# Patient Record
Sex: Female | Born: 1995 | Race: Black or African American | Hispanic: No | Marital: Single | Smoking: Current some day smoker
Health system: Southern US, Community
[De-identification: ages and names within clinical notes are randomized; demographics above are authoritative.]

## PROBLEM LIST (undated history)

## (undated) ENCOUNTER — Inpatient Hospital Stay (HOSPITAL_COMMUNITY): Payer: Self-pay

## (undated) DIAGNOSIS — R109 Unspecified abdominal pain: Secondary | ICD-10-CM

## (undated) DIAGNOSIS — O26899 Other specified pregnancy related conditions, unspecified trimester: Secondary | ICD-10-CM

## (undated) DIAGNOSIS — B373 Candidiasis of vulva and vagina: Secondary | ICD-10-CM

## (undated) DIAGNOSIS — Z6791 Unspecified blood type, Rh negative: Secondary | ICD-10-CM

## (undated) DIAGNOSIS — J45909 Unspecified asthma, uncomplicated: Secondary | ICD-10-CM

## (undated) DIAGNOSIS — Z8619 Personal history of other infectious and parasitic diseases: Secondary | ICD-10-CM

## (undated) DIAGNOSIS — A4151 Sepsis due to Escherichia coli [E. coli]: Secondary | ICD-10-CM

## (undated) DIAGNOSIS — N390 Urinary tract infection, site not specified: Secondary | ICD-10-CM

## (undated) HISTORY — DX: Unspecified asthma, uncomplicated: J45.909

## (undated) HISTORY — PX: SKIN SURGERY: SHX2413

---
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
## 2016-10-12 NOTE — L&D Delivery Note (Signed)
Patient is 21 y.o. G1P1001 [redacted]w[redacted]d admitted for IOL for IUGR. S/p IOL with Pitocin. AROM at 0318.  Prenatal course also complicated by pyelonephritis, trichomonas, IUGR anemia.  Delivery Note At 6:32 AM a viable female was delivered via Vaginal, Spontaneous Delivery (Presentation: LOA).  APGAR: 7, 9; weight  pending.   Placenta status: Intact.  Cord: 3V with the following complications: None.  Cord pH: N/A  Anesthesia:  None Episiotomy: None Lacerations: Labial Suture Repair: vicryl Est. Blood Loss (mL): 300  Mom to postpartum.  Baby to Couplet care / Skin to Skin.  Caryl Ada, DO OB Fellow 07/06/2017, 7:31 AM

## 2016-11-12 DIAGNOSIS — Z8619 Personal history of other infectious and parasitic diseases: Secondary | ICD-10-CM

## 2016-11-12 HISTORY — DX: Personal history of other infectious and parasitic diseases: Z86.19

## 2016-11-26 ENCOUNTER — Encounter (HOSPITAL_COMMUNITY): Payer: Self-pay

## 2016-11-26 ENCOUNTER — Emergency Department (HOSPITAL_COMMUNITY)
Admission: EM | Admit: 2016-11-26 | Discharge: 2016-11-27 | Disposition: A | Discharge status: Home / Self Care | Payer: Medicaid Other | Attending: Emergency Medicine | Admitting: Emergency Medicine

## 2016-11-26 DIAGNOSIS — O009 Unspecified ectopic pregnancy without intrauterine pregnancy: Secondary | ICD-10-CM

## 2016-11-26 DIAGNOSIS — Z3A08 8 weeks gestation of pregnancy: Secondary | ICD-10-CM | POA: Diagnosis not present

## 2016-11-26 DIAGNOSIS — O4690 Antepartum hemorrhage, unspecified, unspecified trimester: Secondary | ICD-10-CM

## 2016-11-26 DIAGNOSIS — Z6791 Unspecified blood type, Rh negative: Secondary | ICD-10-CM

## 2016-11-26 DIAGNOSIS — O23591 Infection of other part of genital tract in pregnancy, first trimester: Secondary | ICD-10-CM | POA: Diagnosis not present

## 2016-11-26 DIAGNOSIS — N939 Abnormal uterine and vaginal bleeding, unspecified: Secondary | ICD-10-CM

## 2016-11-26 DIAGNOSIS — O200 Threatened abortion: Secondary | ICD-10-CM

## 2016-11-26 DIAGNOSIS — O98311 Other infections with a predominantly sexual mode of transmission complicating pregnancy, first trimester: Secondary | ICD-10-CM | POA: Diagnosis not present

## 2016-11-26 DIAGNOSIS — B9689 Other specified bacterial agents as the cause of diseases classified elsewhere: Secondary | ICD-10-CM

## 2016-11-26 DIAGNOSIS — A599 Trichomoniasis, unspecified: Secondary | ICD-10-CM

## 2016-11-26 DIAGNOSIS — O26899 Other specified pregnancy related conditions, unspecified trimester: Secondary | ICD-10-CM

## 2016-11-26 DIAGNOSIS — O360111 Maternal care for anti-D [Rh] antibodies, first trimester, fetus 1: Secondary | ICD-10-CM | POA: Insufficient documentation

## 2016-11-26 DIAGNOSIS — O2341 Unspecified infection of urinary tract in pregnancy, first trimester: Secondary | ICD-10-CM | POA: Insufficient documentation

## 2016-11-26 DIAGNOSIS — O209 Hemorrhage in early pregnancy, unspecified: Secondary | ICD-10-CM | POA: Diagnosis present

## 2016-11-26 DIAGNOSIS — N760 Acute vaginitis: Secondary | ICD-10-CM

## 2016-11-26 DIAGNOSIS — N390 Urinary tract infection, site not specified: Secondary | ICD-10-CM

## 2016-11-26 LAB — HCG, QUANTITATIVE, PREGNANCY: hCG, Beta Chain, Quant, S: 144598 m[IU]/mL — ABNORMAL HIGH (ref <5)

## 2016-11-26 LAB — URINALYSIS, ROUTINE W REFLEX MICROSCOPIC
Bilirubin Urine: NEGATIVE
Glucose, UA: NEGATIVE mg/dL
Hgb urine dipstick: NEGATIVE
KETONES UR: 20 mg/dL — AB
Nitrite: NEGATIVE
Protein, ur: 30 mg/dL — AB
Specific Gravity, Urine: 1.024 (ref 1.005–1.030)
pH: 5 (ref 5.0–8.0)

## 2016-11-26 LAB — PREGNANCY, URINE: Preg Test, Ur: POSITIVE — AB

## 2016-11-26 NOTE — ED Triage Notes (Addendum)
2 months pregnant and she has a little bleeding per family.  Started bleeding around 4 or 5 this afternoon.  I noticed one small blood clot.  Stomach was hurting, but I was not cramping.  Patient states that she has not seen an OBGYN

## 2016-11-26 NOTE — ED Notes (Signed)
Patient states that she is not bleeding at this time.

## 2016-11-27 ENCOUNTER — Emergency Department (HOSPITAL_COMMUNITY): Payer: Medicaid Other

## 2016-11-27 LAB — WET PREP, GENITAL
SPERM: NONE SEEN
YEAST WET PREP: NONE SEEN

## 2016-11-27 LAB — ABO/RH: ABO/RH(D): A NEG

## 2016-11-27 MED ORDER — NITROFURANTOIN MONOHYD MACRO 100 MG PO CAPS: 100.0000 mg | ORAL_CAPSULE | Freq: Two times a day (BID) | ORAL | 0 refills | Status: DC

## 2016-11-27 MED ORDER — RHO D IMMUNE GLOBULIN 1500 UNIT/2ML IJ SOSY
300.0000 ug | PREFILLED_SYRINGE | Freq: Once | INTRAMUSCULAR | Status: AC
  Administered 2016-11-27: 300 ug via INTRAMUSCULAR

## 2016-11-27 MED ORDER — METRONIDAZOLE 500 MG PO TABS: 500.0000 mg | ORAL_TABLET | Freq: Two times a day (BID) | ORAL | 0 refills | Status: DC

## 2016-11-27 NOTE — Discharge Instructions (Signed)
Take the antibiotics as prescribed. You can take acetaminophen 650 mg every 6 hrs for pain if needed. Keep your prenatal appointment on March 14. Remember your blood type is A negative.  Return to the ED if you get worsening pain, have bleeding like a regular period or if you pass large clots.

## 2016-11-27 NOTE — ED Provider Notes (Signed)
[facility]-EMERGENCY DEPT Provider Note   CSN: [id number] Arrival date & time: 11/26/16  1834     History   Chief Complaint Chief Complaint  Patient presents with  . Vaginal Bleeding    HPI April Young is a 21 y.o. female , G1P0 who is currently approximately [redacted] weeks pregnant with LMP 09/28/16 presenting with an approximate 2 hour episodes of low pelvic pain accompanied by passage of a small amount of bloody spotting and passage of a small blood clot this evening which has since resolved. She denies pain currently and the spotting has resolved.  She does endorse white thin vaginal discharge since the found out she was pregnant.  She also states she has struggled with nausea and vomiting daily associated with the pregnancy.  [facility] but will not formally have a full gyn exam to establish care until next month. She denies dysuria, hematuria, fever, back pain, diarrhea.  HPI  History reviewed. No pertinent past medical history.  There are no active problems to display for this patient.   Past Surgical History:  Procedure Laterality Date  . SKIN SURGERY     abcess    OB History    Gravida Para Term Preterm AB Living   1 0 0 0 0 0   SAB TAB Ectopic Multiple Live Births   0               Home Medications    Prior to Admission medications   Medication Sig Start Date End Date Taking? Authorizing Provider  ibuprofen (ADVIL,MOTRIN) 400 MG tablet Take 1 tablet (400 mg total) by mouth every 6 (six) hours as needed for mild pain or cramping. 02/06/15   Marcellina Millin, MD    Family History No family history on file.  Social History Social History  Substance Use Topics  . Smoking status: Never Smoker  . Smokeless tobacco: Never Used  . Alcohol use No     Allergies   Penicillins   Review of Systems Review of Systems  Constitutional: Negative for fever.  HENT: Negative for congestion and sore throat.   Eyes:  Negative.   Respiratory: Negative for chest tightness and shortness of breath.   Cardiovascular: Negative for chest pain.  Gastrointestinal: Positive for abdominal pain, nausea and vomiting.  Genitourinary: Positive for vaginal bleeding and vaginal discharge. Negative for dysuria and hematuria.  Musculoskeletal: Negative for arthralgias, joint swelling and neck pain.  Skin: Negative.  Negative for rash and wound.  Neurological: Negative for dizziness, weakness, light-headedness, numbness and headaches.  Psychiatric/Behavioral: Negative.      Physical Exam Updated Vital Signs BP 120/57 (BP Location: Right Arm)   Pulse 85   Temp 98.7 F (37.1 C)   Resp 18   Ht 5\' 8"  (1.727 m)   Wt 47.2 kg   LMP 09/28/2016 (Exact Date)   SpO2 100%   BMI 15.81 kg/m   Physical Exam  Constitutional: She appears well-developed and well-nourished.  HENT:  Head: Normocephalic and atraumatic.  Eyes: Conjunctivae are normal.  Neck: Normal range of motion.  Cardiovascular: Normal rate, regular rhythm, normal heart sounds and intact distal pulses.   Pulmonary/Chest: Effort normal and breath sounds normal. She has no wheezes.  Abdominal: Soft. Bowel sounds are normal. She exhibits no distension and no mass. There is no tenderness. There is no guarding.  Genitourinary: Uterus normal. Cervix exhibits discharge. Cervix exhibits no motion tenderness. Right adnexum displays tenderness. Right adnexum displays  no mass and no fullness. Left adnexum displays tenderness. Left adnexum displays no mass and no fullness. No erythema or bleeding in the vagina. Vaginal discharge found.  Musculoskeletal: Normal range of motion.  Neurological: She is alert.  Skin: Skin is warm and dry.  Psychiatric: She has a normal mood and affect.  Nursing note and vitals reviewed.    ED Treatments / Results  Labs (all labs ordered are listed, but only abnormal results are displayed) Results for orders placed or performed during the  hospital encounter of 11/26/16  Wet prep, genital  Result Value Ref Range   Yeast Wet Prep HPF POC NONE SEEN NONE SEEN   Trich, Wet Prep PRESENT (A) NONE SEEN   Clue Cells Wet Prep HPF POC PRESENT (A) NONE SEEN   WBC, Wet Prep HPF POC FEW (A) NONE SEEN   Sperm NONE SEEN   hCG, quantitative, pregnancy  Result Value Ref Range   hCG, Beta Chain, Quant, S 144,598 (H) <5 mIU/mL  Pregnancy, urine  Result Value Ref Range   Preg Test, Ur POSITIVE (A) NEGATIVE  Urinalysis, Routine w reflex microscopic  Result Value Ref Range   Color, Urine YELLOW YELLOW   APPearance HAZY (A) CLEAR   Specific Gravity, Urine 1.024 1.005 - 1.030   pH 5.0 5.0 - 8.0   Glucose, UA NEGATIVE NEGATIVE mg/dL   Hgb urine dipstick NEGATIVE NEGATIVE   Bilirubin Urine NEGATIVE NEGATIVE   Ketones, ur 20 (A) NEGATIVE mg/dL   Protein, ur 30 (A) NEGATIVE mg/dL   Nitrite NEGATIVE NEGATIVE   Leukocytes, UA LARGE (A) NEGATIVE   RBC / HPF 6-30 0 - 5 RBC/hpf   WBC, UA TOO NUMEROUS TO COUNT 0 - 5 WBC/hpf   Bacteria, UA MANY (A) NONE SEEN   Mucous PRESENT    Budding Yeast PRESENT    Hyaline Casts, UA PRESENT   ABO/Rh  Result Value Ref Range   ABO/RH(D) A NEG    No results found.   EKG  EKG Interpretation None       Radiology No results found.  Procedures Procedures (including critical care time)  Medications Ordered in ED Medications - No data to display   Initial Impression / Assessment and Plan / ED Course  I have reviewed the triage vital signs and the nursing notes.  Pertinent labs & imaging results that were available during my care of the patient were reviewed by me and considered in my medical decision making (see chart for details).     Labs reviewed, urine cx sent.  Pt with trichomonas and bv, ua not normal with leukocytes and bacteria but no dysuria or sx suggesting uti.  Culture ordered.  Pt is rh negative, rhogam IM ordered.  Pending US to rule out ectopic.    Discussed with Dr Lynelle DoctorKnapp who  will follow and dispo patient.  Final Clinical Impressions(s) / ED Diagnoses   Final diagnoses:  Vaginal bleeding in pregnancy  Trichomonas infection  Bacterial vaginosis    New Prescriptions New Prescriptions   No medications on file     Burgess AmorJulie Briell Paulette, PA-C 11/27/16 0140    Burgess AmorJulie Asheley Hellberg, PA-C 11/27/16 [phone]    Devoria AlbeIva Knapp, MD 11/27/16 [phone]

## 2016-11-28 LAB — RH IG WORKUP (INCLUDES ABO/RH)
ABO/RH(D): A NEG
Antibody Screen: NEGATIVE
Gestational Age(Wks): 8
UNIT DIVISION: 0

## 2016-11-28 LAB — RPR: RPR: NONREACTIVE

## 2016-11-28 LAB — HIV ANTIBODY (ROUTINE TESTING W REFLEX): HIV Screen 4th Generation wRfx: NONREACTIVE

## 2016-11-30 LAB — URINE CULTURE
Culture: >=100000 — AB
Special Requests: NORMAL

## 2016-11-30 LAB — GC/CHLAMYDIA PROBE AMP (~~LOC~~) NOT AT ARMC
CHLAMYDIA, DNA PROBE: NEGATIVE
NEISSERIA GONORRHEA: NEGATIVE

## 2016-12-01 ENCOUNTER — Telehealth: Payer: Self-pay | Admitting: Emergency Medicine

## 2016-12-01 NOTE — Progress Notes (Signed)
ED Antimicrobial Stewardship Positive Culture Follow Up   April Young is an 21 y.o. female [facility] with a chief complaint of  Chief Complaint  Patient presents with  . Vaginal Bleeding    Recent Results (from the past 720 hour(s))  Urine culture     Status: Abnormal   Collection Time: 11/26/16  8:11 PM  Result Value Ref Range Status   Specimen Description URINE, CLEAN CATCH  Final   Special Requests Normal  Final   Culture (A)  Final    >=100,000 COLONIES/mL ESCHERICHIA COLI >=100,000 COLONIES/mL STAPHYLOCOCCUS SPECIES (COAGULASE NEGATIVE)    Report Status 11/30/2016 FINAL  Final   Organism ID, Bacteria ESCHERICHIA COLI (A)  Final   Organism ID, Bacteria STAPHYLOCOCCUS SPECIES (COAGULASE NEGATIVE) (A)  Final      Susceptibility   Escherichia coli - MIC*    AMPICILLIN <=2 SENSITIVE Sensitive     CEFAZOLIN <=4 SENSITIVE Sensitive     CEFTRIAXONE <=1 SENSITIVE Sensitive     CIPROFLOXACIN <=0.25 SENSITIVE Sensitive     GENTAMICIN <=1 SENSITIVE Sensitive     IMIPENEM <=0.25 SENSITIVE Sensitive     NITROFURANTOIN <=16 SENSITIVE Sensitive     TRIMETH/SULFA <=20 SENSITIVE Sensitive     AMPICILLIN/SULBACTAM <=2 SENSITIVE Sensitive     PIP/TAZO <=4 SENSITIVE Sensitive     Extended ESBL NEGATIVE Sensitive     * >=100,000 COLONIES/mL ESCHERICHIA COLI   Staphylococcus species (coagulase negative) - MIC*    CIPROFLOXACIN <=0.5 SENSITIVE Sensitive     GENTAMICIN <=0.5 SENSITIVE Sensitive     NITROFURANTOIN <=16 SENSITIVE Sensitive     OXACILLIN SENSITIVE Sensitive     TETRACYCLINE <=1 SENSITIVE Sensitive     VANCOMYCIN <=0.5 SENSITIVE Sensitive     TRIMETH/SULFA <=10 SENSITIVE Sensitive     CLINDAMYCIN <=0.25 SENSITIVE Sensitive     RIFAMPIN <=0.5 SENSITIVE Sensitive     Inducible Clindamycin NEGATIVE Sensitive     * >=100,000 COLONIES/mL STAPHYLOCOCCUS SPECIES (COAGULASE NEGATIVE)  Wet prep, genital     Status: Abnormal   Collection Time:  11/26/16 11:56 PM  Result Value Ref Range Status   Yeast Wet Prep HPF POC NONE SEEN NONE SEEN Final   Trich, Wet Prep PRESENT (A) NONE SEEN Final   Clue Cells Wet Prep HPF POC PRESENT (A) NONE SEEN Final   WBC, Wet Prep HPF POC FEW (A) NONE SEEN Final   Sperm NONE SEEN  Final    20 YOF noted to be [redacted] weeks pregnant who presented on 2/15 with vaginal bleeding. Work-up revealed UTI, BV, and trichomonas. The patient was discharged on Flagyl and Macrobid for 5 days.   Flagyl is noted to be a pregnancy class B medication however it is noted to have some risk for cleft palate when given in the first trimester of pregnancy. This was discussed with the PA-C who does not feel strongly enough to adjust the medication regimen at this time. Will continue the Flagyl as prescribed.  Also in pregnant patients - a 7-10 day antibiotic duration is recommended for UTIs - would recommend extending the course of Macrobid to ensure treated completely.  New antibiotic prescription: Continue Macrobid 100 mg bid for an additional 5 days to make a total LOT of 10 days (will need new script sent for 10 more pills). Continue Flagyl as prescribed.   ED Provider: Langston MaskerKaren Sofia, PA-C  [name], April Ensz Ann 12/01/2016, 9:03 AM Infectious Diseases Pharmacist Phone# [phone]

## 2016-12-01 NOTE — Telephone Encounter (Signed)
Post ED Visit - Positive Culture Follow-up: Successful Patient Follow-Up  Culture assessed and recommendations reviewed by: []  Enzo BiNathan Batchelder, Pharm.D. []  Celedonio MiyamotoJeremy Frens, [address] BoulevardPharm.D., BCPS []  Garvin FilaMike Maccia, Pharm.D. [x]  Georgina PillionElizabeth Martin, Pharm.D., BCPS []  ThunderboltMinh Pham, VermontPharm.D., BCPS, AAHIVP []  Estella HuskMichelle Turner, Pharm.D., BCPS, AAHIVP []  Tennis Mustassie Stewart, Pharm.D. []  Sherle Poeob Vincent, [address]Pharm[address]D.  Positive urine culture  []  Patient discharged without antimicrobial prescription and treatment is now indicated []  Organism is resistant to prescribed ED discharge antimicrobial []  Patient with positive blood cultures  Changes discussed with ED provider: Lawyer PA New antibiotic prescription add additional 5 days of Macrobid 100mg  po bid x 5 more days [facility]  Contacted patient, 12/01/16 1551   Berle MullMiller, Jaid Quirion 12/01/2016, 3:50 PM

## 2017-01-01 ENCOUNTER — Other Ambulatory Visit: Payer: Self-pay | Admitting: Obstetrics and Gynecology

## 2017-01-01 DIAGNOSIS — O3680X0 Pregnancy with inconclusive fetal viability, not applicable or unspecified: Secondary | ICD-10-CM

## 2017-01-04 ENCOUNTER — Ambulatory Visit (INDEPENDENT_AMBULATORY_CARE_PROVIDER_SITE_OTHER): Payer: Medicaid Other

## 2017-01-04 DIAGNOSIS — O3680X0 Pregnancy with inconclusive fetal viability, not applicable or unspecified: Secondary | ICD-10-CM | POA: Diagnosis not present

## 2017-01-04 NOTE — Progress Notes (Addendum)
US 14 wks,fhr 137 bpm,normal ov's bilat,NB present,post pl gr 0,efw 102 g,EDD 07/05/2017

## 2017-01-10 ENCOUNTER — Inpatient Hospital Stay (HOSPITAL_COMMUNITY)
Admission: AD | Admit: 2017-01-10 | Discharge: 2017-01-10 | Disposition: A | Discharge status: Home / Self Care | Payer: Medicaid Other | Source: Ambulatory Visit | Attending: Obstetrics and Gynecology | Admitting: Obstetrics and Gynecology

## 2017-01-10 ENCOUNTER — Encounter (HOSPITAL_COMMUNITY): Payer: Self-pay

## 2017-01-10 DIAGNOSIS — O98312 Other infections with a predominantly sexual mode of transmission complicating pregnancy, second trimester: Secondary | ICD-10-CM | POA: Insufficient documentation

## 2017-01-10 DIAGNOSIS — O26892 Other specified pregnancy related conditions, second trimester: Secondary | ICD-10-CM | POA: Diagnosis not present

## 2017-01-10 DIAGNOSIS — Z880 Allergy status to penicillin: Secondary | ICD-10-CM | POA: Diagnosis not present

## 2017-01-10 DIAGNOSIS — A5901 Trichomonal vulvovaginitis: Secondary | ICD-10-CM | POA: Diagnosis not present

## 2017-01-10 DIAGNOSIS — Z79899 Other long term (current) drug therapy: Secondary | ICD-10-CM | POA: Insufficient documentation

## 2017-01-10 DIAGNOSIS — O209 Hemorrhage in early pregnancy, unspecified: Secondary | ICD-10-CM | POA: Diagnosis not present

## 2017-01-10 DIAGNOSIS — N939 Abnormal uterine and vaginal bleeding, unspecified: Secondary | ICD-10-CM | POA: Diagnosis present

## 2017-01-10 DIAGNOSIS — R109 Unspecified abdominal pain: Secondary | ICD-10-CM | POA: Diagnosis not present

## 2017-01-10 DIAGNOSIS — Z3A14 14 weeks gestation of pregnancy: Secondary | ICD-10-CM | POA: Diagnosis not present

## 2017-01-10 DIAGNOSIS — O23592 Infection of other part of genital tract in pregnancy, second trimester: Secondary | ICD-10-CM | POA: Diagnosis not present

## 2017-01-10 HISTORY — DX: Other specified pregnancy related conditions, unspecified trimester: O26.899

## 2017-01-10 HISTORY — DX: Unspecified blood type, rh negative: Z67.91

## 2017-01-10 HISTORY — DX: Personal history of other infectious and parasitic diseases: Z86.19

## 2017-01-10 LAB — URINALYSIS, ROUTINE W REFLEX MICROSCOPIC
BILIRUBIN URINE: NEGATIVE
Glucose, UA: NEGATIVE mg/dL
Ketones, ur: NEGATIVE mg/dL
NITRITE: POSITIVE — AB
PH: 7 (ref 5.0–8.0)
Protein, ur: NEGATIVE mg/dL
Specific Gravity, Urine: 1.021 (ref 1.005–1.030)

## 2017-01-10 LAB — WET PREP, GENITAL
CLUE CELLS WET PREP: NONE SEEN
Sperm: NONE SEEN
Yeast Wet Prep HPF POC: NONE SEEN

## 2017-01-10 MED ORDER — METRONIDAZOLE 500 MG PO TABS
2000.0000 mg | ORAL_TABLET | Freq: Once | ORAL | Status: AC
  Administered 2017-01-10: 2000 mg via ORAL
  Filled 2017-01-10: qty 4

## 2017-01-10 NOTE — MAU Provider Note (Signed)
History     CSN: [id number]  Arrival date and time: 01/10/17 1403    First Provider Initiated Contact with Patient 01/10/17 1456      Chief Complaint  Patient presents with  . Vaginal Bleeding  . Abdominal Pain   HPI April Young is a 21 y.o. G1P0000 at [redacted]w[redacted]d who presents via EMS with vaginal bleeding & abdominal cramping. Symptoms began 10 am this morning. Reports bright red blood on pad today. Not passing clots. Had abdominal cramping yesterday. Currently no pain. Pt is Rh negative -- received rhogham in ED 2/16 d/t vaginal bleeding. During that ED visit she was diagnosed with trichomonas -- states she was discharge home with abx (flagyl 500 mg BID x 7 days) -- states she didn't finish antibiotics b/c they made her vomit. Has not had intercourse since that visit. Denies n/v/d, constipation, vaginal discharge, LOF.   OB History    Gravida Para Term Preterm AB Living   1 0 0 0 0 0   SAB TAB Ectopic Multiple Live Births   0              Past Medical History:  Diagnosis Date  . Hx of trichomoniasis 11/2016  . Rh negative status during pregnancy     Past Surgical History:  Procedure Laterality Date  . SKIN SURGERY     abcess    History reviewed. No pertinent family history.  Social History  Substance Use Topics  . Smoking status: Never Smoker  . Smokeless tobacco: Never Used  . Alcohol use No    Allergies:  Allergies  Allergen Reactions  . Penicillins     Has patient had a PCN reaction causing immediate rash, facial/tongue/throat swelling, SOB or lightheadedness with hypotension: No Has patient had a PCN reaction causing severe rash involving mucus membranes or skin necrosis: No Has patient had a PCN reaction that required hospitalization No Has patient had a PCN reaction occurring within the last 10 years: No If all of the above answers are "NO", then may proceed with Cephalosporin use.     Prescriptions Prior to Admission  Medication Sig Dispense Refill  Last Dose  . ibuprofen (ADVIL,MOTRIN) 400 MG tablet Take 1 tablet (400 mg total) by mouth every 6 (six) hours as needed for mild pain or cramping. 20 tablet 0   . metroNIDAZOLE (FLAGYL) 500 MG tablet Take 1 tablet (500 mg total) by mouth 2 (two) times daily. 14 tablet 0   . nitrofurantoin, macrocrystal-monohydrate, (MACROBID) 100 MG capsule Take 1 capsule (100 mg total) by mouth 2 (two) times daily. 10 capsule 0     Review of Systems  Constitutional: Negative.   Gastrointestinal: Positive for abdominal pain. Negative for constipation, diarrhea, nausea and vomiting.  Genitourinary: Positive for vaginal bleeding. Negative for dysuria, flank pain and vaginal discharge.   Physical Exam   Blood pressure 111/69, pulse 89, temperature 97.7 F (36.5 C), temperature source Oral, resp. rate 16, last menstrual period 09/28/2016.  Physical Exam  Nursing note and vitals reviewed. Constitutional: She is oriented to person, place, and time. She appears well-developed and well-nourished. No distress.  HENT:  Head: Normocephalic and atraumatic.  Eyes: Conjunctivae are normal. Right eye exhibits no discharge. Left eye exhibits no discharge. No scleral icterus.  Neck: Normal range of motion.  Cardiovascular: Normal rate, regular rhythm and normal heart sounds.   No murmur heard. Respiratory: Effort normal and breath sounds normal. No respiratory distress. She has no wheezes.  GI: Soft. Bowel sounds  are normal. There is no tenderness.  Genitourinary: Cervix exhibits friability. Cervix exhibits no motion tenderness. No bleeding in the vagina. Vaginal discharge found.  Genitourinary Comments: Cervix closed  Neurological: She is alert and oriented to person, place, and time.  Skin: Skin is warm and dry. She is not diaphoretic.  Psychiatric: She has a normal mood and affect. Her behavior is normal. Judgment and thought content normal.    MAU Course  Procedures Results for orders placed or performed  during the hospital encounter of 01/10/17 (from the past 24 hour(s))  Urinalysis, Routine w reflex microscopic     Status: Abnormal   Collection Time: 01/10/17  2:25 PM  Result Value Ref Range   Color, Urine YELLOW YELLOW   APPearance HAZY (A) CLEAR   Specific Gravity, Urine 1.021 1.005 - 1.030   pH 7.0 5.0 - 8.0   Glucose, UA NEGATIVE NEGATIVE mg/dL   Hgb urine dipstick SMALL (A) NEGATIVE   Bilirubin Urine NEGATIVE NEGATIVE   Ketones, ur NEGATIVE NEGATIVE mg/dL   Protein, ur NEGATIVE NEGATIVE mg/dL   Nitrite POSITIVE (A) NEGATIVE   Leukocytes, UA MODERATE (A) NEGATIVE   RBC / HPF 0-5 0 - 5 RBC/hpf   WBC, UA TOO NUMEROUS TO COUNT 0 - 5 WBC/hpf   Bacteria, UA MANY (A) NONE SEEN   Squamous Epithelial / LPF 0-5 (A) NONE SEEN   Mucous PRESENT   Wet prep, genital     Status: Abnormal   Collection Time: 01/10/17  3:11 PM  Result Value Ref Range   Yeast Wet Prep HPF POC NONE SEEN NONE SEEN   Trich, Wet Prep PRESENT (A) NONE SEEN   Clue Cells Wet Prep HPF POC NONE SEEN NONE SEEN   WBC, Wet Prep HPF POC FEW (A) NONE SEEN   Sperm NONE SEEN     MDM FHT 140 by doppler GC/CT & wet prep Rhogham not indicated today -- VB is d/t cervical friability & pt received rhogham in February Wet prep + trich Flagyl 2 gm PO U/a + nitrites  --- will send for urine culture -- pt denies urinary complaints -- nitrites likely d/t trich infection  Assessment and Plan  A; 1. Trichomonal vaginitis during pregnancy in second trimester    P: Discharge home Pelvic rest Partner needs tx GC/CT & urine culture pending Discussed reasons to return to MAU  Judeth Horn 01/10/2017, 2:43 PM

## 2017-01-10 NOTE — Discharge Instructions (Signed)

## 2017-01-10 NOTE — MAU Note (Addendum)
Patient arrived via EMS with c/o onset of vaginal bleeding and cramping which started around 10:00 am, still wearing pad from onset of bleeding. States had spotting yesterday, no recent intercourse, denies pain at this time.

## 2017-01-11 LAB — GC/CHLAMYDIA PROBE AMP (~~LOC~~) NOT AT ARMC
CHLAMYDIA, DNA PROBE: NEGATIVE
Neisseria Gonorrhea: NEGATIVE

## 2017-01-12 LAB — CULTURE, OB URINE: Culture: >=100000 — AB

## 2017-01-13 ENCOUNTER — Other Ambulatory Visit (HOSPITAL_COMMUNITY): Payer: Self-pay | Admitting: Advanced Practice Midwife

## 2017-01-13 MED ORDER — SULFAMETHOXAZOLE-TRIMETHOPRIM 800-160 MG PO TABS: 1.0000 | ORAL_TABLET | Freq: Two times a day (BID) | ORAL | 0 refills | Status: DC

## 2017-01-13 NOTE — Progress Notes (Signed)
UTI, E Coli  Allergic to PCN  Rx Bactrim DS since she is out of first trimester  Patient notified  Aviva Signs, CNM

## 2017-01-19 ENCOUNTER — Ambulatory Visit: Payer: Medicaid Other | Admitting: *Deleted

## 2017-01-19 ENCOUNTER — Ambulatory Visit (INDEPENDENT_AMBULATORY_CARE_PROVIDER_SITE_OTHER): Payer: Medicaid Other | Admitting: Advanced Practice Midwife

## 2017-01-19 ENCOUNTER — Encounter: Payer: Self-pay | Admitting: Advanced Practice Midwife

## 2017-01-19 VITALS — BP 102/52 | HR 93 | Wt 112.0 lb

## 2017-01-19 DIAGNOSIS — O0990 Supervision of high risk pregnancy, unspecified, unspecified trimester: Secondary | ICD-10-CM | POA: Insufficient documentation

## 2017-01-19 DIAGNOSIS — Z331 Pregnant state, incidental: Secondary | ICD-10-CM | POA: Diagnosis not present

## 2017-01-19 DIAGNOSIS — Z3A16 16 weeks gestation of pregnancy: Secondary | ICD-10-CM

## 2017-01-19 DIAGNOSIS — Z1389 Encounter for screening for other disorder: Secondary | ICD-10-CM | POA: Diagnosis not present

## 2017-01-19 DIAGNOSIS — Z3402 Encounter for supervision of normal first pregnancy, second trimester: Secondary | ICD-10-CM | POA: Diagnosis not present

## 2017-01-19 DIAGNOSIS — Z363 Encounter for antenatal screening for malformations: Secondary | ICD-10-CM

## 2017-01-19 DIAGNOSIS — O0930 Supervision of pregnancy with insufficient antenatal care, unspecified trimester: Secondary | ICD-10-CM

## 2017-01-19 LAB — POCT URINALYSIS DIPSTICK
Blood, UA: NEGATIVE
GLUCOSE UA: NEGATIVE
KETONES UA: NEGATIVE
Leukocytes, UA: NEGATIVE
Nitrite, UA: POSITIVE
Protein, UA: NEGATIVE

## 2017-01-19 NOTE — Patient Instructions (Signed)
 First Trimester of Pregnancy The first trimester of pregnancy is from week 1 until the end of week 12 (months 1 through 3). A week after a sperm fertilizes an egg, the egg will implant on the wall of the uterus. This embryo will begin to develop into a baby. Genes from you and your partner are forming the baby. The female genes determine whether the baby is a boy or a girl. At 6-8 weeks, the eyes and face are formed, and the heartbeat can be seen on ultrasound. At the end of 12 weeks, all the baby's organs are formed.  Now that you are pregnant, you will want to do everything you can to have a healthy baby. Two of the most important things are to get good prenatal care and to follow your health care provider's instructions. Prenatal care is all the medical care you receive before the baby's birth. This care will help prevent, find, and treat any problems during the pregnancy and childbirth. BODY CHANGES Your body goes through many changes during pregnancy. The changes vary from woman to woman.   You may gain or lose a couple of pounds at first.  You may feel sick to your stomach (nauseous) and throw up (vomit). If the vomiting is uncontrollable, call your health care provider.  You may tire easily.  You may develop headaches that can be relieved by medicines approved by your health care provider.  You may urinate more often. Painful urination may mean you have a bladder infection.  You may develop heartburn as a result of your pregnancy.  You may develop constipation because certain hormones are causing the muscles that push waste through your intestines to slow down.  You may develop hemorrhoids or swollen, bulging veins (varicose veins).  Your breasts may begin to grow larger and become tender. Your nipples may stick out more, and the tissue that surrounds them (areola) may become darker.  Your gums may bleed and may be sensitive to brushing and flossing.  Dark spots or blotches  (chloasma, mask of pregnancy) may develop on your face. This will likely fade after the baby is born.  Your menstrual periods will stop.  You may have a loss of appetite.  You may develop cravings for certain kinds of food.  You may have changes in your emotions from day to day, such as being excited to be pregnant or being concerned that something may go wrong with the pregnancy and baby.  You may have more vivid and strange dreams.  You may have changes in your hair. These can include thickening of your hair, rapid growth, and changes in texture. Some women also have hair loss during or after pregnancy, or hair that feels dry or thin. Your hair will most likely return to normal after your baby is born. WHAT TO EXPECT AT YOUR PRENATAL VISITS During a routine prenatal visit:  You will be weighed to make sure you and the baby are growing normally.  Your blood pressure will be taken.  Your abdomen will be measured to track your baby's growth.  The fetal heartbeat will be listened to starting around week 10 or 12 of your pregnancy.  Test results from any previous visits will be discussed. Your health care provider may ask you:  How you are feeling.  If you are feeling the baby move.  If you have had any abnormal symptoms, such as leaking fluid, bleeding, severe headaches, or abdominal cramping.  If you have any questions. Other   tests that may be performed during your first trimester include:  Blood tests to find your blood type and to check for the presence of any previous infections. They will also be used to check for low iron levels (anemia) and Rh antibodies. Later in the pregnancy, blood tests for diabetes will be done along with other tests if problems develop.  Urine tests to check for infections, diabetes, or protein in the urine.  An ultrasound to confirm the proper growth and development of the baby.  An amniocentesis to check for possible genetic problems.  Fetal  screens for spina bifida and Down syndrome.  You may need other tests to make sure you and the baby are doing well. HOME CARE INSTRUCTIONS  Medicines  Follow your health care provider's instructions regarding medicine use. Specific medicines may be either safe or unsafe to take during pregnancy.  Take your prenatal vitamins as directed.  If you develop constipation, try taking a stool softener if your health care provider approves. Diet  Eat regular, well-balanced meals. Choose a variety of foods, such as meat or vegetable-based protein, fish, milk and low-fat dairy products, vegetables, fruits, and whole grain breads and cereals. Your health care provider will help you determine the amount of weight gain that is right for you.  Avoid raw meat and uncooked cheese. These carry germs that can cause birth defects in the baby.  Eating four or five small meals rather than three large meals a day may help relieve nausea and vomiting. If you start to feel nauseous, eating a few soda crackers can be helpful. Drinking liquids between meals instead of during meals also seems to help nausea and vomiting.  If you develop constipation, eat more high-fiber foods, such as fresh vegetables or fruit and whole grains. Drink enough fluids to keep your urine clear or pale yellow. Activity and Exercise  Exercise only as directed by your health care provider. Exercising will help you:  Control your weight.  Stay in shape.  Be prepared for labor and delivery.  Experiencing pain or cramping in the lower abdomen or low back is a good sign that you should stop exercising. Check with your health care provider before continuing normal exercises.  Try to avoid standing for long periods of time. Move your legs often if you must stand in one place for a long time.  Avoid heavy lifting.  Wear low-heeled shoes, and practice good posture.  You may continue to have sex unless your health care provider directs you  otherwise. Relief of Pain or Discomfort  Wear a good support bra for breast tenderness.   Take warm sitz baths to soothe any pain or discomfort caused by hemorrhoids. Use hemorrhoid cream if your health care provider approves.   Rest with your legs elevated if you have leg cramps or low back pain.  If you develop varicose veins in your legs, wear support hose. Elevate your feet for 15 minutes, 3-4 times a day. Limit salt in your diet. Prenatal Care  Schedule your prenatal visits by the twelfth week of pregnancy. They are usually scheduled monthly at first, then more often in the last 2 months before delivery.  Write down your questions. Take them to your prenatal visits.  Keep all your prenatal visits as directed by your health care provider. Safety  Wear your seat belt at all times when driving.  Make a list of emergency phone numbers, including numbers for family, friends, the hospital, and police and fire departments. General   Tips  Ask your health care provider for a referral to a local prenatal education class. Begin classes no later than at the beginning of month 6 of your pregnancy.  Ask for help if you have counseling or nutritional needs during pregnancy. Your health care provider can offer advice or refer you to specialists for help with various needs.  Do not use hot tubs, steam rooms, or saunas.  Do not douche or use tampons or scented sanitary pads.  Do not cross your legs for long periods of time.  Avoid cat litter boxes and soil used by cats. These carry germs that can cause birth defects in the baby and possibly loss of the fetus by miscarriage or stillbirth.  Avoid all smoking, herbs, alcohol, and medicines not prescribed by your health care provider. Chemicals in these affect the formation and growth of the baby.  Schedule a dentist appointment. At home, brush your teeth with a soft toothbrush and be gentle when you floss. SEEK MEDICAL CARE IF:   You have  dizziness.  You have mild pelvic cramps, pelvic pressure, or nagging pain in the abdominal area.  You have persistent nausea, vomiting, or diarrhea.  You have a bad smelling vaginal discharge.  You have pain with urination.  You notice increased swelling in your face, hands, legs, or ankles. SEEK IMMEDIATE MEDICAL CARE IF:   You have a fever.  You are leaking fluid from your vagina.  You have spotting or bleeding from your vagina.  You have severe abdominal cramping or pain.  You have rapid weight gain or loss.  You vomit blood or material that looks like coffee grounds.  You are exposed to German measles and have never had them.  You are exposed to fifth disease or chickenpox.  You develop a severe headache.  You have shortness of breath.  You have any kind of trauma, such as from a fall or a car accident. Document Released: 09/22/2001 Document Revised: 02/12/2014 Document Reviewed: 08/08/2013 Exit[employer] Patient Information 2015 [employer]. This information is not intended to replace advice given to you by your health care provider. Make sure you discuss any questions you have with your health care provider.   Nausea & Vomiting  Have saltine crackers or pretzels by your bed and eat a few bites before you raise your head out of bed in the morning  Eat small frequent meals throughout the day instead of large meals  Drink plenty of fluids throughout the day to stay hydrated, just don't drink a lot of fluids with your meals.  This can make your stomach fill up faster making you feel sick  Do not brush your teeth right after you eat  Products with real ginger are good for nausea, like ginger ale and ginger hard candy Make sure it says made with real ginger!  Sucking on sour candy like lemon heads is also good for nausea  If your prenatal vitamins make you nauseated, take them at night so you will sleep through the nausea  Sea Bands  If you feel like you need  medicine for the nausea & vomiting please let us know  If you are unable to keep any fluids or food down please let us know   Constipation  Drink plenty of fluid, preferably water, throughout the day  Eat foods high in fiber such as fruits, vegetables, and grains  Exercise, such as walking, is a good way to keep your bowels regular  Drink warm fluids, especially warm   prune juice, or decaf coffee  Eat a 1/2 cup of real oatmeal (not instant), 1/2 cup applesauce, and 1/2-1 cup warm prune juice every day  If needed, you may take Colace (docusate sodium) stool softener once or twice a day to help keep the stool soft. If you are pregnant, wait until you are out of your first trimester (12-14 weeks of pregnancy)  If you still are having problems with constipation, you may take Miralax once daily as needed to help keep your bowels regular.  If you are pregnant, wait until you are out of your first trimester (12-14 weeks of pregnancy)  Safe Medications in Pregnancy   Acne: Benzoyl Peroxide Salicylic Acid  Backache/Headache: Tylenol: 2 regular strength every 4 hours OR              2 Extra strength every 6 hours  Colds/Coughs/Allergies: Benadryl (alcohol free) 25 mg every 6 hours as needed Breath right strips Claritin Cepacol throat lozenges Chloraseptic throat spray Cold-Eeze- up to three times per day Cough drops, alcohol free Flonase (by prescription only) Guaifenesin Mucinex Robitussin DM (plain only, alcohol free) Saline nasal spray/drops Sudafed (pseudoephedrine) & Actifed ** use only after [redacted] weeks gestation and if you do not have high blood pressure Tylenol Vicks Vaporub Zinc lozenges Zyrtec   Constipation: Colace Ducolax suppositories Fleet enema Glycerin suppositories Metamucil Milk of magnesia Miralax Senokot Smooth move tea  Diarrhea: Kaopectate Imodium A-D  *NO pepto Bismol  Hemorrhoids: Anusol Anusol HC Preparation  H Tucks  Indigestion: Tums Maalox Mylanta Zantac  Pepcid  Insomnia: Benadryl (alcohol free) 25mg every 6 hours as needed Tylenol PM Unisom, no Gelcaps  Leg Cramps: Tums MagGel  Nausea/Vomiting:  Bonine Dramamine Emetrol Ginger extract Sea bands Meclizine  Nausea medication to take during pregnancy:  Unisom (doxylamine succinate 25 mg tablets) Take one tablet daily at bedtime. If symptoms are not adequately controlled, the dose can be increased to a maximum recommended dose of two tablets daily (1/2 tablet in the morning, 1/2 tablet mid-afternoon and one at bedtime). Vitamin B6 100mg tablets. Take one tablet twice a day (up to 200 mg per day).  Skin Rashes: Aveeno products Benadryl cream or 25mg every 6 hours as needed Calamine Lotion 1% cortisone cream  Yeast infection: Gyne-lotrimin 7 Monistat 7   **If taking multiple medications, please check labels to avoid duplicating the same active ingredients **take medication as directed on the label ** Do not exceed 4000 mg of tylenol in 24 hours **Do not take medications that contain aspirin or ibuprofen      

## 2017-01-19 NOTE — Progress Notes (Signed)
  Subjective:    April Young is a G1P0000 [redacted]w[redacted]d being seen today for her first obstetrical visit.  Her obstetrical history is significant for first pregancy.  Pregnancy history fully reviewed.  Patient reports no complaints. Treated for trich in MAU 4/1.  Never picked up abx for UTI, will do it now.   Vitals:   01/19/17 1347  BP: (!) 102/52  Pulse: 93  Weight: 112 lb (50.8 kg)    HISTORY: OB History  Gravida Para Term Preterm AB Living  1 0 0 0 0 0  SAB TAB Ectopic Multiple Live Births  0            # Outcome Date GA Lbr Len/2nd Weight Sex Delivery Anes PTL Lv  1 Current              Past Medical History:  Diagnosis Date  . Hx of trichomoniasis 11/2016  . Rh negative status during pregnancy    Past Surgical History:  Procedure Laterality Date  . SKIN SURGERY     abcess   Family History  Problem Relation Age of Onset  . COPD Maternal Grandmother      Exam                                      System:     Skin: normal coloration and turgor, no rashes    Neurologic: oriented, normal, normal mood   Extremities: normal strength, tone, and muscle mass   HEENT PERRLA   Mouth/Teeth mucous membranes moist, normal dentition   Neck supple and no masses   Cardiovascular: regular rate and rhythm   Respiratory:  appears well, vitals normal, no respiratory distress, acyanotic   Abdomen: soft, non-tender;  FHR: 160 [place]          Assessment:    Pregnancy: G1P0000 Patient Active Problem List   Diagnosis Date Noted  . Supervision of normal first pregnancy 01/19/2017  . Late prenatal care 01/19/2017        Plan:     Initial labs drawn. Continue prenatal vitamins  Start abx for UTI Problem list reviewed and updated  Reviewed n/v relief measures and warning s/s to report  Reviewed recommended weight gain based on pre-gravid BMI  Encouraged well-balanced diet Genetic Screening discussedAFP: requested.  Ultrasound discussed; fetal survey:  requested.  Return in about 3 weeks (around 02/09/2017) for LROB, HQ:IONGEXB.  CRESENZO-DISHMAN,Shanna Strength 01/19/2017

## 2017-01-20 LAB — PMP SCREEN PROFILE (10S), URINE
AMPHETAMINE SCRN UR: NEGATIVE ng/mL
Barbiturate Screen, Ur: NEGATIVE ng/mL
Benzodiazepine Screen, Urine: NEGATIVE ng/mL
COCAINE(METAB.) SCREEN, URINE: NEGATIVE ng/mL
Cannabinoids Ur Ql Scn: NEGATIVE ng/mL
Creatinine(Crt), U: 163.2 mg/dL (ref 20.0–300.0)
METHADONE SCREEN, URINE: NEGATIVE ng/mL
OXYCODONE+OXYMORPHONE UR QL SCN: NEGATIVE ng/mL
Opiate Scrn, Ur: NEGATIVE ng/mL
PCP SCRN UR: NEGATIVE ng/mL
PROPOXYPHENE SCREEN: NEGATIVE ng/mL
Ph of Urine: 6.8 (ref 4.5–8.9)

## 2017-01-20 LAB — RUBELLA SCREEN: RUBELLA: 22.4 {index} (ref >0.99)

## 2017-01-20 LAB — HEPATITIS B SURFACE ANTIGEN: Hepatitis B Surface Ag: NEGATIVE

## 2017-01-20 LAB — CBC
HEMATOCRIT: 34.7 % (ref 34.0–46.6)
HEMOGLOBIN: 11.5 g/dL (ref 11.1–15.9)
MCH: 27.2 pg (ref 26.6–33.0)
MCHC: 33.1 g/dL (ref 31.5–35.7)
MCV: 82 fL (ref 79–97)
Platelets: 215 10*3/uL (ref 150–379)
RBC: 4.23 x10E6/uL (ref 3.77–5.28)
RDW: 15.7 % — ABNORMAL HIGH (ref 12.3–15.4)
WBC: 9 10*3/uL (ref 3.4–10.8)

## 2017-01-20 LAB — VARICELLA ZOSTER ANTIBODY, IGG: Varicella zoster IgG: 666 index (ref >165)

## 2017-01-20 LAB — SICKLE CELL SCREEN: Sickle Cell Screen: NEGATIVE

## 2017-01-22 LAB — AFP, QUAD SCREEN
DIA Mom Value: 1.17
DIA Value (EIA): 235.48 pg/mL
DSR (BY AGE) 1 IN: 1167
DSR (SECOND TRIMESTER) 1 IN: 3198
Gestational Age: 16.5 WEEKS
MSAFP MOM: 0.79
MSAFP: 37.6 ng/mL
MSHCG Mom: 1.84
MSHCG: 78027 m[IU]/mL
Maternal Age At EDD: 19.7 yr
Osb Risk: 10000
Test Results:: NEGATIVE
UE3 MOM: 1.25
UE3 VALUE: 1.2 ng/mL
Weight: 112 [lb_av]

## 2017-01-25 ENCOUNTER — Telehealth: Payer: Self-pay | Admitting: *Deleted

## 2017-01-25 NOTE — Telephone Encounter (Signed)
Patient was seen on 4/10 with Drenda Freeze and prescription for antibiotic was not sent for UTI. Please advise.

## 2017-01-25 NOTE — Telephone Encounter (Signed)
Pt informed to call her pharmacy and let them know she is going to come get her ABX so they can get it ready for her.  Pt voiced understanding.

## 2017-02-02 ENCOUNTER — Telehealth: Payer: Self-pay | Admitting: *Deleted

## 2017-02-02 NOTE — Telephone Encounter (Signed)
sulfamethoxazole-trimethoprim (BACTRIM DS,SEPTRA DS) 800-160 MG tablet  Take 1 tablet 2 times daily Verbal order called for F. Cresenzo- Dishmon, CNM  Pt did not take original prescription

## 2017-02-09 ENCOUNTER — Encounter: Payer: Self-pay | Admitting: Obstetrics & Gynecology

## 2017-02-09 ENCOUNTER — Ambulatory Visit (INDEPENDENT_AMBULATORY_CARE_PROVIDER_SITE_OTHER): Payer: Medicaid Other

## 2017-02-09 ENCOUNTER — Ambulatory Visit (INDEPENDENT_AMBULATORY_CARE_PROVIDER_SITE_OTHER): Payer: Medicaid Other | Admitting: Obstetrics & Gynecology

## 2017-02-09 VITALS — BP 90/50 | HR 88 | Wt 118.0 lb

## 2017-02-09 DIAGNOSIS — O2341 Unspecified infection of urinary tract in pregnancy, first trimester: Secondary | ICD-10-CM

## 2017-02-09 DIAGNOSIS — Z3402 Encounter for supervision of normal first pregnancy, second trimester: Secondary | ICD-10-CM

## 2017-02-09 DIAGNOSIS — O0930 Supervision of pregnancy with insufficient antenatal care, unspecified trimester: Secondary | ICD-10-CM

## 2017-02-09 DIAGNOSIS — Z363 Encounter for antenatal screening for malformations: Secondary | ICD-10-CM | POA: Diagnosis not present

## 2017-02-09 DIAGNOSIS — Z1389 Encounter for screening for other disorder: Secondary | ICD-10-CM

## 2017-02-09 DIAGNOSIS — Z3A19 19 weeks gestation of pregnancy: Secondary | ICD-10-CM | POA: Diagnosis not present

## 2017-02-09 DIAGNOSIS — Z331 Pregnant state, incidental: Secondary | ICD-10-CM

## 2017-02-09 DIAGNOSIS — Z8619 Personal history of other infectious and parasitic diseases: Secondary | ICD-10-CM

## 2017-02-09 LAB — POCT URINALYSIS DIPSTICK
Blood, UA: NEGATIVE
Glucose, UA: NEGATIVE
KETONES UA: NEGATIVE
Leukocytes, UA: NEGATIVE
Nitrite, UA: NEGATIVE
PROTEIN UA: NEGATIVE

## 2017-02-09 NOTE — Progress Notes (Signed)
G1P0000 [redacted]w[redacted]d Estimated Date of Delivery: 07/05/17  Blood pressure (!) 90/50, pulse 88, weight 118 lb (53.5 kg), last menstrual period 09/28/2016.   BP weight and urine results all reviewed and noted.  Please refer to the obstetrical flow sheet for the fundal height and fetal heart rate documentation:  Patient reports good fetal movement, denies any bleeding and no rupture of membranes symptoms or regular contractions. Patient is without complaints. All questions were answered.  Orders Placed This Encounter  Procedures  . POCT urinalysis dipstick    Plan:  Continued routine obstetrical care, sonogram is normal see report  Return in about 4 weeks (around 03/09/2017) for LROB.

## 2017-02-09 NOTE — Addendum Note (Signed)
Addended by: Federico Flake A on: 02/09/2017 02:18 PM   Modules accepted: Orders

## 2017-02-09 NOTE — Progress Notes (Signed)
[place] 19+1 wks,cephalic,cx 3.1 cm,normal ovaries bilat,post pl gr 0,fhr 140 bpm,svp of fluid 4.3 cm,EFW 311 g,anatomy complete,no obvious abnormalities seen

## 2017-02-09 NOTE — Addendum Note (Signed)
Addended by: Federico Flake A on: 02/09/2017 12:23 PM   Modules accepted: Orders

## 2017-02-09 NOTE — Addendum Note (Signed)
Addended by: Federico Flake A on: 02/09/2017 01:43 PM   Modules accepted: Orders

## 2017-02-11 LAB — URINE CULTURE

## 2017-02-12 LAB — TRICHOMONAS VAGINALIS, PROBE AMP: TRICH VAG BY NAA: POSITIVE — AB

## 2017-02-19 ENCOUNTER — Encounter: Payer: Self-pay | Admitting: Obstetrics & Gynecology

## 2017-03-09 ENCOUNTER — Encounter: Payer: Medicaid Other | Admitting: Obstetrics & Gynecology

## 2017-03-10 ENCOUNTER — Inpatient Hospital Stay (HOSPITAL_COMMUNITY)
Admission: AD | Admit: 2017-03-10 | Discharge: 2017-03-10 | Disposition: A | Discharge status: Home / Self Care | Payer: Medicaid Other | Source: Ambulatory Visit | Attending: Obstetrics and Gynecology | Admitting: Obstetrics and Gynecology

## 2017-03-10 ENCOUNTER — Telehealth: Payer: Self-pay | Admitting: Obstetrics and Gynecology

## 2017-03-10 ENCOUNTER — Encounter (HOSPITAL_COMMUNITY): Payer: Self-pay

## 2017-03-10 DIAGNOSIS — Z3A23 23 weeks gestation of pregnancy: Secondary | ICD-10-CM | POA: Insufficient documentation

## 2017-03-10 DIAGNOSIS — O2342 Unspecified infection of urinary tract in pregnancy, second trimester: Secondary | ICD-10-CM | POA: Diagnosis not present

## 2017-03-10 DIAGNOSIS — N390 Urinary tract infection, site not specified: Secondary | ICD-10-CM

## 2017-03-10 DIAGNOSIS — B3731 Acute candidiasis of vulva and vagina: Secondary | ICD-10-CM

## 2017-03-10 DIAGNOSIS — O98812 Other maternal infectious and parasitic diseases complicating pregnancy, second trimester: Secondary | ICD-10-CM | POA: Diagnosis not present

## 2017-03-10 DIAGNOSIS — O0930 Supervision of pregnancy with insufficient antenatal care, unspecified trimester: Secondary | ICD-10-CM

## 2017-03-10 DIAGNOSIS — O26899 Other specified pregnancy related conditions, unspecified trimester: Secondary | ICD-10-CM

## 2017-03-10 DIAGNOSIS — O9989 Other specified diseases and conditions complicating pregnancy, childbirth and the puerperium: Secondary | ICD-10-CM

## 2017-03-10 DIAGNOSIS — R1110 Vomiting, unspecified: Secondary | ICD-10-CM | POA: Diagnosis not present

## 2017-03-10 DIAGNOSIS — R109 Unspecified abdominal pain: Secondary | ICD-10-CM | POA: Diagnosis present

## 2017-03-10 DIAGNOSIS — Z3402 Encounter for supervision of normal first pregnancy, second trimester: Secondary | ICD-10-CM

## 2017-03-10 DIAGNOSIS — B373 Candidiasis of vulva and vagina: Secondary | ICD-10-CM | POA: Insufficient documentation

## 2017-03-10 DIAGNOSIS — O26892 Other specified pregnancy related conditions, second trimester: Secondary | ICD-10-CM | POA: Diagnosis present

## 2017-03-10 HISTORY — DX: Urinary tract infection, site not specified: N39.0

## 2017-03-10 HISTORY — DX: Candidiasis of vulva and vagina: B37.3

## 2017-03-10 HISTORY — DX: Other specified pregnancy related conditions, unspecified trimester: O26.899

## 2017-03-10 HISTORY — DX: Unspecified abdominal pain: R10.9

## 2017-03-10 HISTORY — DX: Acute candidiasis of vulva and vagina: B37.31

## 2017-03-10 LAB — COMPREHENSIVE METABOLIC PANEL
ALBUMIN: 3.2 g/dL — AB (ref 3.5–5.0)
ALT: 11 U/L — ABNORMAL LOW (ref 14–54)
ANION GAP: 8 (ref 5–15)
AST: 15 U/L (ref 15–41)
Alkaline Phosphatase: 68 U/L (ref 38–126)
BILIRUBIN TOTAL: 0.2 mg/dL — AB (ref 0.3–1.2)
BUN: 6 mg/dL (ref 6–20)
CHLORIDE: 104 mmol/L (ref 101–111)
CO2: 20 mmol/L — AB (ref 22–32)
Calcium: 8.1 mg/dL — ABNORMAL LOW (ref 8.9–10.3)
Creatinine, Ser: 0.49 mg/dL (ref 0.44–1.00)
GFR calc Af Amer: >60 mL/min (ref >60)
GFR calc non Af Amer: >60 mL/min (ref >60)
GLUCOSE: 93 mg/dL (ref 65–99)
POTASSIUM: 3.2 mmol/L — AB (ref 3.5–5.1)
SODIUM: 132 mmol/L — AB (ref 135–145)
TOTAL PROTEIN: 6.8 g/dL (ref 6.5–8.1)

## 2017-03-10 LAB — CBC
HEMATOCRIT: 28.2 % — AB (ref 36.0–46.0)
Hemoglobin: 9.3 g/dL — ABNORMAL LOW (ref 12.0–15.0)
MCH: 28.1 pg (ref 26.0–34.0)
MCHC: 33 g/dL (ref 30.0–36.0)
MCV: 85.2 fL (ref 78.0–100.0)
Platelets: 182 10*3/uL (ref 150–400)
RBC: 3.31 MIL/uL — ABNORMAL LOW (ref 3.87–5.11)
RDW: 14.2 % (ref 11.5–15.5)
WBC: 12.4 10*3/uL — ABNORMAL HIGH (ref 4.0–10.5)

## 2017-03-10 LAB — URINALYSIS, ROUTINE W REFLEX MICROSCOPIC
BILIRUBIN URINE: NEGATIVE
Glucose, UA: NEGATIVE mg/dL
KETONES UR: NEGATIVE mg/dL
Nitrite: POSITIVE — AB
Protein, ur: 100 mg/dL — AB
SPECIFIC GRAVITY, URINE: 1.013 (ref 1.005–1.030)
pH: 7 (ref 5.0–8.0)

## 2017-03-10 LAB — WET PREP, GENITAL
Clue Cells Wet Prep HPF POC: NONE SEEN
SPERM: NONE SEEN
TRICH WET PREP: NONE SEEN

## 2017-03-10 LAB — OB RESULTS CONSOLE GC/CHLAMYDIA: GC PROBE AMP, GENITAL: NEGATIVE

## 2017-03-10 MED ORDER — TERCONAZOLE 0.4 % VA CREA: 1.0000 | TOPICAL_CREAM | Freq: Every day | VAGINAL | 0 refills | Status: DC

## 2017-03-10 MED ORDER — ACETAMINOPHEN 500 MG PO TABS
1000.0000 mg | ORAL_TABLET | ORAL | Status: AC
  Administered 2017-03-10: 1000 mg via ORAL
  Filled 2017-03-10: qty 2

## 2017-03-10 MED ORDER — ONDANSETRON 8 MG PO TBDP
8.0000 mg | ORAL_TABLET | ORAL | Status: AC
  Administered 2017-03-10: 8 mg via ORAL
  Filled 2017-03-10: qty 1

## 2017-03-10 MED ORDER — NITROFURANTOIN MONOHYD MACRO 100 MG PO CAPS: 100.0000 mg | ORAL_CAPSULE | Freq: Two times a day (BID) | ORAL | 0 refills | Status: DC

## 2017-03-10 NOTE — Telephone Encounter (Signed)
TC to patient - identification verified by DOB  Patient informed of UTI dx and Rx for Macrobid 100 mg BID po x 7 days [facility] [place].  Patient verbalized an understanding of the plan of care and agrees.  April MoraRolitta Kanai Young, CNM 03/10/2017 1:56 PM

## 2017-03-10 NOTE — MAU Note (Signed)
Pt was brought in by EMS. Pt states around 3 am started to feel abdominal pain which woke her up. She reports vomiting 4 times since then. Reports last feeling the baby move last night before going to sleep. Vomited once here on MAU unit. Denies vaginal bleeding or leaking.

## 2017-03-10 NOTE — MAU Provider Note (Signed)
History     CSN: [id number]  Arrival date and time: 03/10/17 [id number]   First Provider Initiated Contact with Patient 03/10/17 1017      Chief Complaint  Patient presents with  . Abdominal Pain  . Emesis   HPI  Ms. April Young is a 21 yo G1P0 at 23.[redacted] wks gestation presenting with complaints of abdominal pain that woke her up at 0300.  She reports vomiting x 4 times.  She has had nothing to eat or drink since about 2300.    Past Medical History:  Diagnosis Date  . Hx of trichomoniasis 11/2016  . Rh negative status during pregnancy     Past Surgical History:  Procedure Laterality Date  . SKIN SURGERY     abcess    Family History  Problem Relation Age of Onset  . COPD Maternal Grandmother     Social History  Substance Use Topics  . Smoking status: Never Smoker  . Smokeless tobacco: Never Used  . Alcohol use No    Allergies:  Allergies  Allergen Reactions  . Penicillins     Has patient had a PCN reaction causing immediate rash, facial/tongue/throat swelling, SOB or lightheadedness with hypotension: No Has patient had a PCN reaction causing severe rash involving mucus membranes or skin necrosis: No Has patient had a PCN reaction that required hospitalization No Has patient had a PCN reaction occurring within the last 10 years: No If all of the above answers are "NO", then may proceed with Cephalosporin use.  Childhood reaction- not sure of reaction     Prescriptions Prior to Admission  Medication Sig Dispense Refill Last Dose  . flintstones complete (FLINTSTONES) 60 MG chewable tablet Chew 2 tablets by mouth daily.   Taking    Review of Systems Physical Exam   Blood pressure (!) 108/59, pulse 80, temperature 98.3 F (36.8 C), temperature source Oral, resp. rate 16, height 5\' 8"  (1.727 m), weight 53.5 kg (118 lb), last menstrual period 09/28/2016, SpO2 100 %.  Physical Exam  Results for orders placed or performed during the hospital encounter of  03/10/17 (from the past 24 hour(s))  Urinalysis, Routine w reflex microscopic     Status: Abnormal   Collection Time: 03/10/17  9:08 AM  Result Value Ref Range   Color, Urine YELLOW YELLOW   APPearance CLOUDY (A) CLEAR   Specific Gravity, Urine 1.013 1.005 - 1.030   pH 7.0 5.0 - 8.0   Glucose, UA NEGATIVE NEGATIVE mg/dL   Hgb urine dipstick MODERATE (A) NEGATIVE   Bilirubin Urine NEGATIVE NEGATIVE   Ketones, ur NEGATIVE NEGATIVE mg/dL   Protein, ur 119100 (A) NEGATIVE mg/dL   Nitrite POSITIVE (A) NEGATIVE   Leukocytes, UA LARGE (A) NEGATIVE   RBC / HPF TOO NUMEROUS TO COUNT 0 - 5 RBC/hpf   WBC, UA TOO NUMEROUS TO COUNT 0 - 5 WBC/hpf   Bacteria, UA FEW (A) NONE SEEN   Squamous Epithelial / LPF 0-5 (A) NONE SEEN   WBC Clumps PRESENT    Mucous PRESENT    Non Squamous Epithelial 0-5 (A) NONE SEEN  Wet prep, genital     Status: Abnormal   Collection Time: 03/10/17 10:35 AM  Result Value Ref Range   Yeast Wet Prep HPF POC PRESENT (A) NONE SEEN   Trich, Wet Prep NONE SEEN NONE SEEN   Clue Cells Wet Prep HPF POC NONE SEEN NONE SEEN   WBC, Wet Prep HPF POC MANY (A) NONE SEEN   Sperm NONE  SEEN   CBC     Status: Abnormal   Collection Time: 03/10/17 10:36 AM  Result Value Ref Range   WBC 12.4 (H) 4.0 - 10.5 K/uL   RBC 3.31 (L) 3.87 - 5.11 MIL/uL   Hemoglobin 9.3 (L) 12.0 - 15.0 g/dL   HCT 16.1 (L) 09.6 - 04.5 %   MCV 85.2 78.0 - 100.0 fL   MCH 28.1 26.0 - 34.0 pg   MCHC 33.0 30.0 - 36.0 g/dL   RDW 40.9 81.1 - 91.4 %   Platelets 182 150 - 400 K/uL   CEFM  FHR: 140 bpm / moderate variability / accels present / decels absent TOCO: No UC's noted, an occ UI  MAU Course  Procedures  MDM CCUA CBC  CMP Wet Prep GC/CT NST Urine Culture  Assessment and Plan  Candida vaginitis - Terazol 7 1 applicatorful pv hs x 7 days  Abdominal pain affecting pregnancy - Advised that abdominal pain is not from UC's, but more than likely from UTI - Stay well-hydrated with at least 64 oz of  water daily  UTI affecting pregnancy in 2nd trimester - Rx Macrobid 100 mg po BID x 7 days   Discharge home Call [facility] to schedule OB visit Patient verbalized an understanding of the plan of care and agrees.   Raelyn Mora MSN, CNM 03/10/2017, 10:56 AM

## 2017-03-11 LAB — GC/CHLAMYDIA PROBE AMP (~~LOC~~) NOT AT ARMC
CHLAMYDIA, DNA PROBE: NEGATIVE
NEISSERIA GONORRHEA: NEGATIVE

## 2017-03-12 DIAGNOSIS — A4151 Sepsis due to Escherichia coli [E. coli]: Secondary | ICD-10-CM

## 2017-03-12 HISTORY — DX: Sepsis due to Escherichia coli (e. coli): A41.51

## 2017-03-12 LAB — URINE CULTURE: SPECIAL REQUESTS: NORMAL

## 2017-03-13 ENCOUNTER — Inpatient Hospital Stay (HOSPITAL_COMMUNITY)
Admission: AD | Admit: 2017-03-13 | Discharge: 2017-03-19 | DRG: 781 | Disposition: A | Discharge status: Home / Self Care | Payer: Medicaid Other | Source: Ambulatory Visit | Attending: Obstetrics & Gynecology | Admitting: Obstetrics & Gynecology

## 2017-03-13 ENCOUNTER — Encounter (HOSPITAL_COMMUNITY): Payer: Self-pay | Admitting: *Deleted

## 2017-03-13 DIAGNOSIS — R059 Cough, unspecified: Secondary | ICD-10-CM

## 2017-03-13 DIAGNOSIS — O99019 Anemia complicating pregnancy, unspecified trimester: Secondary | ICD-10-CM | POA: Diagnosis present

## 2017-03-13 DIAGNOSIS — Z3A23 23 weeks gestation of pregnancy: Secondary | ICD-10-CM | POA: Diagnosis not present

## 2017-03-13 DIAGNOSIS — O99112 Other diseases of the blood and blood-forming organs and certain disorders involving the immune mechanism complicating pregnancy, second trimester: Secondary | ICD-10-CM | POA: Diagnosis present

## 2017-03-13 DIAGNOSIS — O99119 Other diseases of the blood and blood-forming organs and certain disorders involving the immune mechanism complicating pregnancy, unspecified trimester: Secondary | ICD-10-CM | POA: Diagnosis present

## 2017-03-13 DIAGNOSIS — O26892 Other specified pregnancy related conditions, second trimester: Secondary | ICD-10-CM | POA: Diagnosis present

## 2017-03-13 DIAGNOSIS — D649 Anemia, unspecified: Secondary | ICD-10-CM | POA: Diagnosis present

## 2017-03-13 DIAGNOSIS — R7881 Bacteremia: Secondary | ICD-10-CM | POA: Diagnosis present

## 2017-03-13 DIAGNOSIS — O99012 Anemia complicating pregnancy, second trimester: Secondary | ICD-10-CM | POA: Diagnosis present

## 2017-03-13 DIAGNOSIS — O26899 Other specified pregnancy related conditions, unspecified trimester: Secondary | ICD-10-CM

## 2017-03-13 DIAGNOSIS — Z3402 Encounter for supervision of normal first pregnancy, second trimester: Secondary | ICD-10-CM

## 2017-03-13 DIAGNOSIS — Z825 Family history of asthma and other chronic lower respiratory diseases: Secondary | ICD-10-CM | POA: Diagnosis not present

## 2017-03-13 DIAGNOSIS — D696 Thrombocytopenia, unspecified: Secondary | ICD-10-CM | POA: Diagnosis present

## 2017-03-13 DIAGNOSIS — I340 Nonrheumatic mitral (valve) insufficiency: Secondary | ICD-10-CM | POA: Diagnosis not present

## 2017-03-13 DIAGNOSIS — Z6791 Unspecified blood type, Rh negative: Secondary | ICD-10-CM | POA: Diagnosis not present

## 2017-03-13 DIAGNOSIS — O9989 Other specified diseases and conditions complicating pregnancy, childbirth and the puerperium: Secondary | ICD-10-CM | POA: Diagnosis not present

## 2017-03-13 DIAGNOSIS — I360 Nonrheumatic tricuspid (valve) stenosis: Secondary | ICD-10-CM | POA: Diagnosis not present

## 2017-03-13 DIAGNOSIS — E876 Hypokalemia: Secondary | ICD-10-CM | POA: Diagnosis present

## 2017-03-13 DIAGNOSIS — A4151 Sepsis due to Escherichia coli [E. coli]: Secondary | ICD-10-CM

## 2017-03-13 DIAGNOSIS — R0682 Tachypnea, not elsewhere classified: Secondary | ICD-10-CM

## 2017-03-13 DIAGNOSIS — Z880 Allergy status to penicillin: Secondary | ICD-10-CM

## 2017-03-13 DIAGNOSIS — O2302 Infections of kidney in pregnancy, second trimester: Secondary | ICD-10-CM

## 2017-03-13 DIAGNOSIS — B3731 Acute candidiasis of vulva and vagina: Secondary | ICD-10-CM

## 2017-03-13 DIAGNOSIS — R05 Cough: Secondary | ICD-10-CM

## 2017-03-13 DIAGNOSIS — O2342 Unspecified infection of urinary tract in pregnancy, second trimester: Secondary | ICD-10-CM | POA: Diagnosis not present

## 2017-03-13 DIAGNOSIS — B9620 Unspecified Escherichia coli [E. coli] as the cause of diseases classified elsewhere: Secondary | ICD-10-CM | POA: Diagnosis present

## 2017-03-13 DIAGNOSIS — R109 Unspecified abdominal pain: Secondary | ICD-10-CM | POA: Diagnosis present

## 2017-03-13 DIAGNOSIS — B373 Candidiasis of vulva and vagina: Secondary | ICD-10-CM | POA: Diagnosis present

## 2017-03-13 DIAGNOSIS — D6959 Other secondary thrombocytopenia: Secondary | ICD-10-CM | POA: Diagnosis present

## 2017-03-13 DIAGNOSIS — O2300 Infections of kidney in pregnancy, unspecified trimester: Secondary | ICD-10-CM

## 2017-03-13 DIAGNOSIS — Z3A24 24 weeks gestation of pregnancy: Secondary | ICD-10-CM | POA: Diagnosis not present

## 2017-03-13 DIAGNOSIS — O98812 Other maternal infectious and parasitic diseases complicating pregnancy, second trimester: Secondary | ICD-10-CM | POA: Diagnosis present

## 2017-03-13 DIAGNOSIS — N12 Tubulo-interstitial nephritis, not specified as acute or chronic: Secondary | ICD-10-CM | POA: Diagnosis present

## 2017-03-13 DIAGNOSIS — O0930 Supervision of pregnancy with insufficient antenatal care, unspecified trimester: Secondary | ICD-10-CM

## 2017-03-13 DIAGNOSIS — R8271 Bacteriuria: Secondary | ICD-10-CM | POA: Diagnosis not present

## 2017-03-13 LAB — CBC WITH DIFFERENTIAL/PLATELET
BAND NEUTROPHILS: 0 %
BASOS ABS: 0 10*3/uL (ref 0.0–0.1)
BASOS PCT: 0 %
Blasts: 0 %
EOS ABS: 0 10*3/uL (ref 0.0–0.7)
EOS PCT: 0 %
HCT: 26.1 % — ABNORMAL LOW (ref 36.0–46.0)
Hemoglobin: 9.1 g/dL — ABNORMAL LOW (ref 12.0–15.0)
LYMPHS ABS: 0.7 10*3/uL (ref 0.7–4.0)
Lymphocytes Relative: 6 %
MCH: 28.4 pg (ref 26.0–34.0)
MCHC: 34.9 g/dL (ref 30.0–36.0)
MCV: 81.6 fL (ref 78.0–100.0)
METAMYELOCYTES PCT: 0 %
MONO ABS: 0.4 10*3/uL (ref 0.1–1.0)
MONOS PCT: 3 %
Myelocytes: 0 %
NEUTROS ABS: 10.9 10*3/uL — AB (ref 1.7–7.7)
Neutrophils Relative %: 91 %
Other: 0 %
PLATELETS: 105 10*3/uL — AB (ref 150–400)
Promyelocytes Absolute: 0 %
RBC: 3.2 MIL/uL — AB (ref 3.87–5.11)
RDW: 14.1 % (ref 11.5–15.5)
WBC: 12 10*3/uL — ABNORMAL HIGH (ref 4.0–10.5)
nRBC: 0 /100 WBC

## 2017-03-13 LAB — URINALYSIS, ROUTINE W REFLEX MICROSCOPIC
Bilirubin Urine: NEGATIVE
GLUCOSE, UA: NEGATIVE mg/dL
Ketones, ur: 5 mg/dL — AB
NITRITE: NEGATIVE
Protein, ur: 100 mg/dL — AB
SPECIFIC GRAVITY, URINE: 1.013 (ref 1.005–1.030)
pH: 5 (ref 5.0–8.0)

## 2017-03-13 LAB — LACTIC ACID, PLASMA: LACTIC ACID, VENOUS: 1.6 mmol/L (ref 0.5–1.9)

## 2017-03-13 MED ORDER — ACETAMINOPHEN 325 MG PO TABS
650.0000 mg | ORAL_TABLET | ORAL | Status: DC | PRN
  Administered 2017-03-13 – 2017-03-16 (×6): 650 mg via ORAL
  Filled 2017-03-13 (×6): qty 2

## 2017-03-13 MED ORDER — LACTATED RINGERS IV SOLN: INTRAVENOUS | Status: DC

## 2017-03-13 MED ORDER — SODIUM CHLORIDE 0.9 % IV BOLUS (SEPSIS)
1000.0000 mL | Freq: Once | INTRAVENOUS | Status: AC
  Administered 2017-03-13: 1000 mL via INTRAVENOUS

## 2017-03-13 MED ORDER — TRAMADOL HCL 50 MG PO TABS
50.0000 mg | ORAL_TABLET | Freq: Four times a day (QID) | ORAL | Status: DC | PRN
  Administered 2017-03-15: 50 mg via ORAL
  Filled 2017-03-13: qty 1

## 2017-03-13 MED ORDER — PRENATAL MULTIVITAMIN CH
1.0000 | ORAL_TABLET | Freq: Every day | ORAL | Status: DC
  Administered 2017-03-17 – 2017-03-18 (×2): 1 via ORAL
  Filled 2017-03-13 (×3): qty 1

## 2017-03-13 MED ORDER — CALCIUM CARBONATE ANTACID 500 MG PO CHEW
2.0000 | CHEWABLE_TABLET | ORAL | Status: DC | PRN
  Administered 2017-03-14: 400 mg via ORAL
  Filled 2017-03-13: qty 2

## 2017-03-13 MED ORDER — ZOLPIDEM TARTRATE 5 MG PO TABS: 5.0000 mg | ORAL_TABLET | Freq: Every evening | ORAL | Status: DC | PRN

## 2017-03-13 MED ORDER — LACTATED RINGERS IV BOLUS (SEPSIS)
1000.0000 mL | Freq: Once | INTRAVENOUS | Status: AC
  Administered 2017-03-13: 1000 mL via INTRAVENOUS

## 2017-03-13 MED ORDER — SODIUM CHLORIDE 0.9 % IV SOLN
INTRAVENOUS | Status: DC
  Administered 2017-03-13 – 2017-03-15 (×5): via INTRAVENOUS

## 2017-03-13 MED ORDER — DOCUSATE SODIUM 100 MG PO CAPS
100.0000 mg | ORAL_CAPSULE | Freq: Every day | ORAL | Status: DC
  Administered 2017-03-15: 100 mg via ORAL
  Filled 2017-03-13: qty 1

## 2017-03-13 MED ORDER — ONDANSETRON HCL 4 MG/2ML IJ SOLN
4.0000 mg | Freq: Once | INTRAMUSCULAR | Status: AC
  Administered 2017-03-13: 4 mg via INTRAVENOUS
  Filled 2017-03-13: qty 2

## 2017-03-13 MED ORDER — CEFTRIAXONE SODIUM 2 G IJ SOLR
2.0000 g | INTRAMUSCULAR | Status: DC
  Administered 2017-03-13 – 2017-03-17 (×5): 2 g via INTRAVENOUS
  Filled 2017-03-13 (×6): qty 2

## 2017-03-13 MED ORDER — DIPHENHYDRAMINE HCL 50 MG/ML IJ SOLN: 25.0000 mg | Freq: Four times a day (QID) | INTRAMUSCULAR | Status: DC | PRN

## 2017-03-13 NOTE — MAU Note (Addendum)
Pt seen here 3 days ago with same symptoms of abd pain, back pain and headache. Now feeling worse. Fever 103.7 earlier today. Has not taken anything for pain or fever. STates head only hurts when sitting up and no abd pain now. Just back pain

## 2017-03-13 NOTE — H&P (Signed)
April Young is a 21 y.o. female G1 @ 23 wks  presenting for r flank pain, fever, nausea, vomiting since yesterday. Was seen on 03/10/17 and dx with e coli in urine and treated with macrobid. . OB History    Gravida Para Term Preterm AB Living   1 0 0 0 0 0   SAB TAB Ectopic Multiple Live Births   0             Past Medical History:  Diagnosis Date  . Abdominal pain affecting pregnancy 03/10/2017  . Candida vaginitis 03/10/2017  . Hx of trichomoniasis 11/2016  . Rh negative status during pregnancy   . Urinary tract infection 03/10/2017   Past Surgical History:  Procedure Laterality Date  . SKIN SURGERY     abcess   Family History: family history includes COPD in her maternal grandmother. Social History:  reports that she has never smoked. She has never used smokeless tobacco. She reports that she uses drugs, including Marijuana. She reports that she does not drink alcohol.     Maternal Diabetes: No Genetic Screening: Normal Maternal Ultrasounds/Referrals: Normal Fetal Ultrasounds or other Referrals:  None Maternal Substance Abuse:  No Significant Maternal Medications:  None Significant Maternal Lab Results:  None Other Comments:  None  Review of Systems  Constitutional: Positive for chills and fever.  HENT: Negative.   Eyes: Negative.   Respiratory: Negative.   Cardiovascular: Negative.   Gastrointestinal: Positive for nausea and vomiting.  Genitourinary: Positive for dysuria, flank pain, frequency and urgency.  Musculoskeletal: Positive for back pain.  Skin: Negative.   Neurological: Negative.   Endo/Heme/Allergies: Negative.   Psychiatric/Behavioral: Negative.    Maternal Medical History:  Reason for admission: Nausea.  sepsis  Contractions: n/a  Fetal activity: Perceived fetal activity is normal.   Last perceived fetal movement was within the past hour.    Prenatal complications: Infection.   Prenatal Complications - Diabetes: none.      Blood  pressure (!) 73/36, pulse (!) 132, temperature (!) 102.8 F (39.3 C), resp. rate 20, height 5\' 8"  (1.727 m), weight 119 lb (54 kg), last menstrual period 09/28/2016, SpO2 100 %. Maternal Exam:  Abdomen: Patient reports no abdominal tenderness. Introitus: not evaluated.   Cervix: not evaluated.   Fetal Exam Fetal Monitor Review: Mode: ultrasound.   Variability: moderate (6-25 bpm).       Physical Exam  Constitutional: She is oriented to person, place, and time. She appears well-developed and well-nourished.  HENT:  Head: Normocephalic.  Neck: Normal range of motion.  Cardiovascular:  tachycardic  Respiratory: Effort normal.  GI: Soft. Bowel sounds are normal.  Musculoskeletal: Normal range of motion.  Neurological: She is alert and oriented to person, place, and time. She has normal reflexes.  Skin: Skin is warm and dry.  Psychiatric: She has a normal mood and affect. Her behavior is normal. Judgment and thought content normal.    Prenatal labs: ABO, Rh: --/--/A NEG, A NEG (02/16 0001) Antibody: NEG (02/16 0001) Rubella: 22.40 (04/10 1447) RPR: Non Reactive (02/16 0001)  HBsAg: Negative (04/10 1447)  HIV: Non Reactive (02/16 0001)  GBS:     Assessment/Plan: Sepsis Pylo preg at 23.5  Plan: admit, sepsis protocol, consult Dr. Debroah LoopArnold.  Dr. Debroah LoopArnold in to see pt.    Wyvonnia DuskyMarie Lawson 03/13/2017, 8:41 PM

## 2017-03-14 ENCOUNTER — Inpatient Hospital Stay (HOSPITAL_COMMUNITY): Payer: Medicaid Other

## 2017-03-14 DIAGNOSIS — O2300 Infections of kidney in pregnancy, unspecified trimester: Secondary | ICD-10-CM

## 2017-03-14 LAB — CBC
HEMATOCRIT: 23.6 % — AB (ref 36.0–46.0)
HEMOGLOBIN: 8.1 g/dL — AB (ref 12.0–15.0)
MCH: 27.7 pg (ref 26.0–34.0)
MCHC: 34.3 g/dL (ref 30.0–36.0)
MCV: 80.8 fL (ref 78.0–100.0)
Platelets: 83 10*3/uL — ABNORMAL LOW (ref 150–400)
RBC: 2.92 MIL/uL — AB (ref 3.87–5.11)
RDW: 14.2 % (ref 11.5–15.5)
WBC: 7.8 10*3/uL (ref 4.0–10.5)

## 2017-03-14 LAB — COMPREHENSIVE METABOLIC PANEL
ALK PHOS: 67 U/L (ref 38–126)
ALK PHOS: 68 U/L (ref 38–126)
ALT: 11 U/L — ABNORMAL LOW (ref 14–54)
ALT: 12 U/L — AB (ref 14–54)
ANION GAP: 10 (ref 5–15)
ANION GAP: 9 (ref 5–15)
AST: 19 U/L (ref 15–41)
AST: 19 U/L (ref 15–41)
Albumin: 1.8 g/dL — ABNORMAL LOW (ref 3.5–5.0)
Albumin: 2.1 g/dL — ABNORMAL LOW (ref 3.5–5.0)
BILIRUBIN TOTAL: 0.7 mg/dL (ref 0.3–1.2)
BILIRUBIN TOTAL: 0.4 mg/dL (ref 0.3–1.2)
BUN: 39 mg/dL — ABNORMAL HIGH (ref 6–20)
BUN: 36 mg/dL — AB (ref 6–20)
CALCIUM: 7.2 mg/dL — AB (ref 8.9–10.3)
CALCIUM: 7.4 mg/dL — AB (ref 8.9–10.3)
CO2: 17 mmol/L — ABNORMAL LOW (ref 22–32)
CO2: 18 mmol/L — ABNORMAL LOW (ref 22–32)
CREATININE: 3.06 mg/dL — AB (ref 0.44–1.00)
Chloride: 105 mmol/L (ref 101–111)
Chloride: 105 mmol/L (ref 101–111)
Creatinine, Ser: 3.27 mg/dL — ABNORMAL HIGH (ref 0.44–1.00)
GFR calc Af Amer: 24 mL/min — ABNORMAL LOW (ref >60)
GFR calc non Af Amer: 19 mL/min — ABNORMAL LOW (ref >60)
GFR calc non Af Amer: 21 mL/min — ABNORMAL LOW (ref >60)
GFR, EST AFRICAN AMERICAN: 22 mL/min — AB (ref >60)
Glucose, Bld: 150 mg/dL — ABNORMAL HIGH (ref 65–99)
Glucose, Bld: 112 mg/dL — ABNORMAL HIGH (ref 65–99)
Potassium: 2.7 mmol/L — CL (ref 3.5–5.1)
Potassium: 3 mmol/L — ABNORMAL LOW (ref 3.5–5.1)
Sodium: 132 mmol/L — ABNORMAL LOW (ref 135–145)
Sodium: 132 mmol/L — ABNORMAL LOW (ref 135–145)
TOTAL PROTEIN: 4.6 g/dL — AB (ref 6.5–8.1)
TOTAL PROTEIN: 5.6 g/dL — AB (ref 6.5–8.1)

## 2017-03-14 LAB — BLOOD CULTURE ID PANEL (REFLEXED)
Acinetobacter baumannii: NOT DETECTED
CANDIDA PARAPSILOSIS: NOT DETECTED
CANDIDA TROPICALIS: NOT DETECTED
CARBAPENEM RESISTANCE: NOT DETECTED
Candida albicans: NOT DETECTED
Candida glabrata: NOT DETECTED
Candida krusei: NOT DETECTED
ENTEROBACTER CLOACAE COMPLEX: NOT DETECTED
Enterobacteriaceae species: DETECTED — AB
Enterococcus species: NOT DETECTED
Escherichia coli: DETECTED — AB
HAEMOPHILUS INFLUENZAE: NOT DETECTED
Klebsiella oxytoca: NOT DETECTED
Klebsiella pneumoniae: NOT DETECTED
Listeria monocytogenes: NOT DETECTED
NEISSERIA MENINGITIDIS: NOT DETECTED
PROTEUS SPECIES: NOT DETECTED
Pseudomonas aeruginosa: NOT DETECTED
SERRATIA MARCESCENS: NOT DETECTED
STAPHYLOCOCCUS AUREUS BCID: NOT DETECTED
STAPHYLOCOCCUS SPECIES: NOT DETECTED
STREPTOCOCCUS AGALACTIAE: NOT DETECTED
STREPTOCOCCUS SPECIES: NOT DETECTED
Streptococcus pneumoniae: NOT DETECTED
Streptococcus pyogenes: NOT DETECTED

## 2017-03-14 LAB — CBC WITH DIFFERENTIAL/PLATELET
Basophils Absolute: 0 10*3/uL (ref 0.0–0.1)
Basophils Relative: 0 %
EOS ABS: 0 10*3/uL (ref 0.0–0.7)
Eosinophils Relative: 0 %
HEMATOCRIT: 21.8 % — AB (ref 36.0–46.0)
HEMOGLOBIN: 7.6 g/dL — AB (ref 12.0–15.0)
LYMPHS ABS: 0.7 10*3/uL (ref 0.7–4.0)
Lymphocytes Relative: 9 %
MCH: 28 pg (ref 26.0–34.0)
MCHC: 34.9 g/dL (ref 30.0–36.0)
MCV: 80.4 fL (ref 78.0–100.0)
MONOS PCT: 1 %
Monocytes Absolute: 0.1 10*3/uL (ref 0.1–1.0)
NEUTROS ABS: 6.2 10*3/uL (ref 1.7–7.7)
NEUTROS PCT: 90 %
Platelets: 74 10*3/uL — ABNORMAL LOW (ref 150–400)
RBC: 2.71 MIL/uL — ABNORMAL LOW (ref 3.87–5.11)
RDW: 14.2 % (ref 11.5–15.5)
WBC: 6.9 10*3/uL (ref 4.0–10.5)

## 2017-03-14 LAB — BLOOD GAS, ARTERIAL
ACID-BASE DEFICIT: 6.6 mmol/L — AB (ref 0.0–2.0)
Acid-base deficit: 6.8 mmol/L — ABNORMAL HIGH (ref 0.0–2.0)
BICARBONATE: 16.4 mmol/L — AB (ref 20.0–28.0)
Bicarbonate: 16.9 mmol/L — ABNORMAL LOW (ref 20.0–28.0)
Drawn by: 14770
Drawn by: 147701
FIO2: 0.21
FIO2: 100
O2 Content: 12 L/min
O2 Saturation: 97 %
O2 Saturation: 100 %
PCO2 ART: 28.7 mmHg — AB (ref 32.0–48.0)
PH ART: 7.428 (ref 7.350–7.450)
PO2 ART: 110 mmHg — AB (ref 83.0–108.0)
pCO2 arterial: 25.2 mmHg — ABNORMAL LOW (ref 32.0–48.0)
pH, Arterial: 7.387 (ref 7.350–7.450)
pO2, Arterial: 445 mmHg — ABNORMAL HIGH (ref 83.0–108.0)

## 2017-03-14 LAB — PROCALCITONIN: PROCALCITONIN: 29.83 ng/mL

## 2017-03-14 LAB — ABO/RH: ABO/RH(D): A NEG

## 2017-03-14 LAB — PROTIME-INR
INR: 1.19
Prothrombin Time: 15.2 seconds (ref 11.4–15.2)

## 2017-03-14 LAB — APTT: aPTT: 42 seconds — ABNORMAL HIGH (ref 24–36)

## 2017-03-14 LAB — LACTIC ACID, PLASMA
LACTIC ACID, VENOUS: 2 mmol/L — AB (ref 0.5–1.9)
Lactic Acid, Venous: 1.8 mmol/L (ref 0.5–1.9)

## 2017-03-14 MED ORDER — POTASSIUM CHLORIDE 10 MEQ/100ML IV SOLN
10.0000 meq | INTRAVENOUS | Status: AC
  Administered 2017-03-14 (×3): 10 meq via INTRAVENOUS
  Filled 2017-03-14 (×3): qty 100

## 2017-03-14 MED ORDER — ONDANSETRON HCL 4 MG/2ML IJ SOLN
4.0000 mg | Freq: Three times a day (TID) | INTRAMUSCULAR | Status: DC | PRN
  Administered 2017-03-14: 4 mg via INTRAVENOUS
  Filled 2017-03-14: qty 2

## 2017-03-14 MED ORDER — SODIUM CHLORIDE 0.9 % IV BOLUS (SEPSIS)
500.0000 mL | Freq: Once | INTRAVENOUS | Status: AC
  Administered 2017-03-14: 500 mL via INTRAVENOUS

## 2017-03-14 NOTE — Progress Notes (Signed)
CRITICAL VALUE ALERT  Critical Value:  Potassium 2.7  Date & Time Notied:  03/14/2017 @0901   Provider Notified: Constant  Orders Received/Actions taken: 03/14/2017

## 2017-03-14 NOTE — Progress Notes (Signed)
PHARMACY - PHYSICIAN COMMUNICATION CRITICAL VALUE ALERT - BLOOD CULTURE IDENTIFICATION (BCID)  Results for orders placed or performed during the hospital encounter of 03/13/17  Blood Culture ID Panel (Reflexed) (Collected: 03/13/2017  8:47 PM)  Result Value Ref Range   Enterococcus species NOT DETECTED NOT DETECTED   Listeria monocytogenes NOT DETECTED NOT DETECTED   Staphylococcus species NOT DETECTED NOT DETECTED   Staphylococcus aureus NOT DETECTED NOT DETECTED   Streptococcus species NOT DETECTED NOT DETECTED   Streptococcus agalactiae NOT DETECTED NOT DETECTED   Streptococcus pneumoniae NOT DETECTED NOT DETECTED   Streptococcus pyogenes NOT DETECTED NOT DETECTED   Acinetobacter baumannii NOT DETECTED NOT DETECTED   Enterobacteriaceae species DETECTED (A) NOT DETECTED   Enterobacter cloacae complex NOT DETECTED NOT DETECTED   Escherichia coli DETECTED (A) NOT DETECTED   Klebsiella oxytoca NOT DETECTED NOT DETECTED   Klebsiella pneumoniae NOT DETECTED NOT DETECTED   Proteus species NOT DETECTED NOT DETECTED   Serratia marcescens NOT DETECTED NOT DETECTED   Carbapenem resistance NOT DETECTED NOT DETECTED   Haemophilus influenzae NOT DETECTED NOT DETECTED   Neisseria meningitidis NOT DETECTED NOT DETECTED   Pseudomonas aeruginosa NOT DETECTED NOT DETECTED   Candida albicans NOT DETECTED NOT DETECTED   Candida glabrata NOT DETECTED NOT DETECTED   Candida krusei NOT DETECTED NOT DETECTED   Candida parapsilosis NOT DETECTED NOT DETECTED   Candida tropicalis NOT DETECTED NOT DETECTED    Name of physician (or Provider) Contacted: Dr. Jolayne Pantheronstant  Changes to prescribed antibiotics required: Patient currently receiving Ceftriaxone 2 grams IV every 24 hours which is first line therapy.  No change in antibiotics recommended.  Hurley CiscoMendenhall, Suzanna Zahn D, PharmD 03/14/2017  1:37 PM

## 2017-03-14 NOTE — Progress Notes (Signed)
Patient is lying in bed in fetal position under the covers with NR mask on. Patient reports feeling well and is without complaints. She continues to report left flank pain but improved since admission.  Blood pressure (!) 87/37, pulse (!) 116, temperature 100.3 F (37.9 C), temperature source Oral, resp. rate (!) 30, height 5\' 9"  (1.753 m), weight 119 lb (54 kg), last menstrual period 09/28/2016, SpO2 97 %. GENERAL: Well-developed, well-nourished female in no acute distress.  LUNGS: Clear to auscultation bilaterally with decreased breath sounds on right lung base ABDOMEN: Soft, nontender, gravid PELVIC: Not indicated EXTREMITIES: No cyanosis, clubbing, or edema, 2+ distal pulses.  BCx- positive for E.coli  A/P 21 yo G1P0 at 7512w6d with pyelonephritis and sepsis - Critical care consulted since 9:30 am - [facility] CPAP to help with work of breathing given tachypnea - Continue IV antibiotics - Contiune monitoring urine output and Cr which is likely the result of acute sepsis - Continue correcting hypokalemia - Continue close observation

## 2017-03-14 NOTE — Progress Notes (Signed)
CRITICAL VALUE ALERT  Critical Value:  Lactic Acid 2.0  Date & Time Notied:  03/14/2017 @ 1158  Provider Notified: Dr. Jolayne Pantheronstant  Orders Received/Actions taken: none at this time

## 2017-03-14 NOTE — Progress Notes (Signed)
Zofran 4 mg IV administered for nausea and vomiting.We will continue to monitor.

## 2017-03-14 NOTE — Progress Notes (Signed)
Aroused pt for assessment. BP reading 48/33, p167 on automatic monitor. Pt has the chills and is trembling. BP and p not reliable automatically at this time. Pt assisted to the restroom by RN an NT, gown and pt's t-shirt both wet, new gown applied and socks applied. Voided 300 dark yellow, cloudy urine. Assisted back to bed, all clothing and linens are dry at this time. Pediatric automatic cuff sweaty and removed for now. Manual bp 80/58, pulse 100 apical, r24

## 2017-03-14 NOTE — Progress Notes (Signed)
PC from Dr. Jolayne Pantheronstant with the results from the CXR, MD made aware that the 02 sats verified with 2 additional sat monitors and is 96-100 on Room air. Per MD if patient requires 02 to maintain sats then the critical care doctor recommends initiating CPAP at a pressure of 8.

## 2017-03-14 NOTE — Progress Notes (Signed)
Pt awake c/o chills. VS done manually by NT and verified by me. Tylenol given to decrease temp. Pt alert and oriented, able to follow commands, has been taking water po.

## 2017-03-14 NOTE — Progress Notes (Signed)
MD made aware that patient 02 sats dropping and not able to maintain 95% on [place], Non re breather mask initiated at 12LPM with patient recovering to 95-97%

## 2017-03-14 NOTE — Progress Notes (Signed)
At times patient seems tired and withdrawn with a flat affect.When asked a question pt respond by nodding her head. Pt does not ask any question about her Plan of Care or attempt to engage in a conversation with care provider. We explain the plan of care and she nodded in the affirmative. A K-Pad was provided for bi-lat flank pain and patient nodded that the heating pad was effective. We will continue to monitor and provide care according to her needs. Her significant person is at bedside.

## 2017-03-14 NOTE — Progress Notes (Signed)
BP 73/25 on automatic. Aroused pt to check manually. She aroused slowly, encouraged to change position to other side. BP 82/40 manual on right arm while left side lying.Once awake alert and oriented, and able to follow commands. States she has voided since she's been here, but no urine collected in the hat. Will reassess pt at 3am and assist her to the restroom at that time. IV fluids infusing, Linens wet from sweating, new top sheet and blankets, scd's in place

## 2017-03-14 NOTE — Progress Notes (Signed)
MD called with report of low bp's. States no change in treatment plan at this time since pt is young and healthy, will continue to monitor.

## 2017-03-14 NOTE — Progress Notes (Signed)
FACULTY PRACTICE ANTEPARTUM(COMPREHENSIVE) NOTE  April ByarsCierra N Young is a 21 y.o. G1P0000 at 5538w6d who is admitted for pyelonephritis.   Fetal presentation is unsure. Length of Stay:  1  Days  Subjective: Feels better Patient reports the fetal movement as active. Patient reports uterine contraction  activity as none. Patient reports  vaginal bleeding as none. Patient describes fluid per vagina as None.  Vitals:  Blood pressure (!) 82/42, pulse (!) 115, temperature 98.2 F (36.8 C), temperature source Oral, resp. rate (!) 22, height 5\' 9"  (1.753 m), weight 54 kg (119 lb), last menstrual period 09/28/2016, SpO2 95 %. Physical Examination:  General appearance - alert, well appearing, and in no distress Heart - normal rate and regular rhythm Abdomen - soft, nontender, nondistended Fundal Height:  size equals dates Cervical Exam: Not evaluated.  Extremities: extremities normal, atraumatic, no cyanosis or edema and Homans sign is negative, no sign of DVT  Membranes:intact  Fetal Monitoring:   Fetal Heart Rate A  Mode External filed at 03/13/2017 2133  Baseline Rate (A) 155 bpm filed at 03/13/2017 2133  Variability 6-25 BPM, <5 BPM filed at 03/13/2017 2133  Accelerations 15 x 15, 10 x 10 filed at 03/13/2017 2133  Decelerations Variable filed at 03/13/2017 2133     Labs:  Results for orders placed or performed during the hospital encounter of 03/13/17 (from the past 24 hour(s))  Urinalysis, Routine w reflex microscopic   Collection Time: 03/13/17  8:30 PM  Result Value Ref Range   Color, Urine AMBER (A) YELLOW   APPearance TURBID (A) CLEAR   Specific Gravity, Urine 1.013 1.005 - 1.030   pH 5.0 5.0 - 8.0   Glucose, UA NEGATIVE NEGATIVE mg/dL   Hgb urine dipstick SMALL (A) NEGATIVE   Bilirubin Urine NEGATIVE NEGATIVE   Ketones, ur 5 (A) NEGATIVE mg/dL   Protein, ur 604100 (A) NEGATIVE mg/dL   Nitrite NEGATIVE NEGATIVE   Leukocytes, UA LARGE (A) NEGATIVE   RBC / HPF 6-30 0 - 5  RBC/hpf   WBC, UA TOO NUMEROUS TO COUNT 0 - 5 WBC/hpf   Bacteria, UA MANY (A) NONE SEEN   Squamous Epithelial / LPF 0-5 (A) NONE SEEN   WBC Clumps PRESENT    Mucous PRESENT   Culture, blood (Routine X 2) w Reflex to ID Panel   Collection Time: 03/13/17  8:47 PM  Result Value Ref Range   Specimen Description BLOOD LEFT ARM    Special Requests      BOTTLES DRAWN AEROBIC AND ANAEROBIC Blood Culture adequate volume   Culture PENDING    Report Status PENDING   CBC with Differential/Platelet   Collection Time: 03/13/17  8:47 PM  Result Value Ref Range   WBC 12.0 (H) 4.0 - 10.5 K/uL   RBC 3.20 (L) 3.87 - 5.11 MIL/uL   Hemoglobin 9.1 (L) 12.0 - 15.0 g/dL   HCT 54.026.1 (L) 98.136.0 - 19.146.0 %   MCV 81.6 78.0 - 100.0 fL   MCH 28.4 26.0 - 34.0 pg   MCHC 34.9 30.0 - 36.0 g/dL   RDW 47.814.1 29.511.5 - 62.115.5 %   Platelets 105 (L) 150 - 400 K/uL   Neutrophils Relative % 91 %   Lymphocytes Relative 6 %   Monocytes Relative 3 %   Eosinophils Relative 0 %   Basophils Relative 0 %   Band Neutrophils 0 %   Metamyelocytes Relative 0 %   Myelocytes 0 %   Promyelocytes Absolute 0 %   Blasts 0 %  nRBC 0 0 /100 WBC   Other 0 %   Neutro Abs 10.9 (H) 1.7 - 7.7 K/uL   Lymphs Abs 0.7 0.7 - 4.0 K/uL   Monocytes Absolute 0.4 0.1 - 1.0 K/uL   Eosinophils Absolute 0.0 0.0 - 0.7 K/uL   Basophils Absolute 0.0 0.0 - 0.1 K/uL  Lactic acid, plasma   Collection Time: 03/13/17  8:47 PM  Result Value Ref Range   Lactic Acid, Venous 1.6 0.5 - 1.9 mmol/L  Culture, blood (Routine X 2) w Reflex to ID Panel   Collection Time: 03/13/17  8:55 PM  Result Value Ref Range   Specimen Description BLOOD LEFT HAND    Special Requests      BOTTLES DRAWN AEROBIC ONLY Blood Culture adequate volume   Culture PENDING    Report Status PENDING   Type and screen [facility] [place]   Collection Time: 03/13/17  9:15 PM  Result Value Ref Range   ABO/RH(D) A NEG    Antibody Screen NEG    Sample Expiration 03/16/2017   ABO/Rh    Collection Time: 03/13/17  9:15 PM  Result Value Ref Range   ABO/RH(D) A NEG       Medications:  Scheduled . docusate sodium  100 mg Oral Daily  . prenatal multivitamin  1 tablet Oral Q1200   I have reviewed the patient's current medications.  ASSESSMENT: Patient Active Problem List   Diagnosis Date Noted  . Pyelonephritis affecting pregnancy, antepartum 03/13/2017  . Candida vaginitis 03/10/2017  . Abdominal pain affecting pregnancy 03/10/2017  . UTI (urinary tract infection) during pregnancy, second trimester 03/10/2017  . Supervision of normal first pregnancy 01/19/2017  . Late prenatal care 01/19/2017    PLAN: Continue IV antibiotics   Scheryl Darter 03/14/2017,7:20 AM

## 2017-03-14 NOTE — Progress Notes (Signed)
[name] from [facility] updated on patient status to assure that sepsis protocol is being followed,  lab currently drawing additional labs for evaluation.

## 2017-03-15 ENCOUNTER — Inpatient Hospital Stay (HOSPITAL_COMMUNITY): Payer: Medicaid Other

## 2017-03-15 DIAGNOSIS — Z825 Family history of asthma and other chronic lower respiratory diseases: Secondary | ICD-10-CM

## 2017-03-15 DIAGNOSIS — A4151 Sepsis due to Escherichia coli [E. coli]: Secondary | ICD-10-CM

## 2017-03-15 DIAGNOSIS — Z880 Allergy status to penicillin: Secondary | ICD-10-CM

## 2017-03-15 DIAGNOSIS — Z3A24 24 weeks gestation of pregnancy: Secondary | ICD-10-CM

## 2017-03-15 DIAGNOSIS — B9620 Unspecified Escherichia coli [E. coli] as the cause of diseases classified elsewhere: Secondary | ICD-10-CM

## 2017-03-15 DIAGNOSIS — O99019 Anemia complicating pregnancy, unspecified trimester: Secondary | ICD-10-CM | POA: Diagnosis present

## 2017-03-15 DIAGNOSIS — R7881 Bacteremia: Secondary | ICD-10-CM | POA: Diagnosis present

## 2017-03-15 DIAGNOSIS — O99012 Anemia complicating pregnancy, second trimester: Secondary | ICD-10-CM | POA: Diagnosis present

## 2017-03-15 LAB — BASIC METABOLIC PANEL
Anion gap: 8 (ref 5–15)
BUN: 24 mg/dL — ABNORMAL HIGH (ref 6–20)
CALCIUM: 7.4 mg/dL — AB (ref 8.9–10.3)
CO2: 16 mmol/L — AB (ref 22–32)
CREATININE: 2.06 mg/dL — AB (ref 0.44–1.00)
Chloride: 111 mmol/L (ref 101–111)
GFR calc non Af Amer: 34 mL/min — ABNORMAL LOW (ref >60)
GFR, EST AFRICAN AMERICAN: 39 mL/min — AB (ref >60)
Glucose, Bld: 77 mg/dL (ref 65–99)
Potassium: 3 mmol/L — ABNORMAL LOW (ref 3.5–5.1)
Sodium: 135 mmol/L (ref 135–145)

## 2017-03-15 LAB — COMPREHENSIVE METABOLIC PANEL
ALK PHOS: 67 U/L (ref 38–126)
ALT: 10 U/L — AB (ref 14–54)
AST: 14 U/L — ABNORMAL LOW (ref 15–41)
Albumin: 1.9 g/dL — ABNORMAL LOW (ref 3.5–5.0)
Anion gap: 8 (ref 5–15)
BILIRUBIN TOTAL: 0.6 mg/dL (ref 0.3–1.2)
BUN: 22 mg/dL — AB (ref 6–20)
CALCIUM: 7.5 mg/dL — AB (ref 8.9–10.3)
CHLORIDE: 111 mmol/L (ref 101–111)
CO2: 15 mmol/L — ABNORMAL LOW (ref 22–32)
CREATININE: 1.95 mg/dL — AB (ref 0.44–1.00)
GFR, EST AFRICAN AMERICAN: 42 mL/min — AB (ref >60)
GFR, EST NON AFRICAN AMERICAN: 36 mL/min — AB (ref >60)
Glucose, Bld: 77 mg/dL (ref 65–99)
Potassium: 3.2 mmol/L — ABNORMAL LOW (ref 3.5–5.1)
Sodium: 134 mmol/L — ABNORMAL LOW (ref 135–145)
TOTAL PROTEIN: 5.1 g/dL — AB (ref 6.5–8.1)

## 2017-03-15 LAB — LACTIC ACID, PLASMA: LACTIC ACID, VENOUS: 0.7 mmol/L (ref 0.5–1.9)

## 2017-03-15 LAB — CBC
HCT: 22.2 % — ABNORMAL LOW (ref 36.0–46.0)
HEMATOCRIT: 21 % — AB (ref 36.0–46.0)
Hemoglobin: 7.5 g/dL — ABNORMAL LOW (ref 12.0–15.0)
Hemoglobin: 7.7 g/dL — ABNORMAL LOW (ref 12.0–15.0)
MCH: 28.4 pg (ref 26.0–34.0)
MCH: 28 pg (ref 26.0–34.0)
MCHC: 35.7 g/dL (ref 30.0–36.0)
MCHC: 34.7 g/dL (ref 30.0–36.0)
MCV: 79.5 fL (ref 78.0–100.0)
MCV: 80.7 fL (ref 78.0–100.0)
Platelets: 78 10*3/uL — ABNORMAL LOW (ref 150–400)
Platelets: 76 10*3/uL — ABNORMAL LOW (ref 150–400)
RBC: 2.64 MIL/uL — ABNORMAL LOW (ref 3.87–5.11)
RBC: 2.75 MIL/uL — AB (ref 3.87–5.11)
RDW: 14.5 % (ref 11.5–15.5)
RDW: 14.7 % (ref 11.5–15.5)
WBC: 6.1 10*3/uL (ref 4.0–10.5)
WBC: 6.9 10*3/uL (ref 4.0–10.5)

## 2017-03-15 MED ORDER — POTASSIUM CHLORIDE 10 MEQ/100ML IV SOLN
10.0000 meq | INTRAVENOUS | Status: AC
  Administered 2017-03-15 (×3): 10 meq via INTRAVENOUS
  Filled 2017-03-15 (×3): qty 100

## 2017-03-15 MED ORDER — DOCUSATE SODIUM 100 MG PO CAPS: 100.0000 mg | ORAL_CAPSULE | Freq: Every day | ORAL | Status: DC | PRN

## 2017-03-15 NOTE — Progress Notes (Signed)
Patient c/o abdominal and flank pain 8/10 Tramadol 50 mg po administered. We will reassess in I hr.

## 2017-03-15 NOTE — Progress Notes (Signed)
Labs and imaging reviewed. D/w ID and they'll come by later today for consult.  April Young, Jr MD Attending [facility] 03/15/2017 Time: 1223pm

## 2017-03-15 NOTE — Progress Notes (Addendum)
Patient ID: Cecelia ByarsCierra N Lucchese, female   DOB: [birth year], 21 y.o.   MRN: [id number]  FACULTY PRACTICE ANTEPARTUM(COMPREHENSIVE) NOTE  Cecelia ByarsCierra N Lovins is a 21 y.o. G1P0000 at 6540w0d who is admitted for pyelonephritis.   Fetal presentation is unsure. Length of Stay:  2  Days  Subjective: Patient reports feeling better than yesterday. Her right flank pain is still present but improving Patient reports the fetal movement as active. Patient reports uterine contraction  activity as none. Patient reports  vaginal bleeding as none. Patient describes fluid per vagina as None.  Vitals:  Blood pressure (!) 95/51, pulse (!) 102, temperature (!) 101.1 F (38.4 C), temperature source Oral, resp. rate 16, height 5\' 9"  (1.753 m), weight 119 lb (54 kg), last menstrual period 09/28/2016, SpO2 97 %. Physical Examination:  General appearance - alert, well appearing, and in no distress Heart - normal rate and regular rhythm Lungs: CTA bilaterally Abdomen - soft, nontender, gravid Fundal Height:  size equals dates Cervical Exam: Not evaluated.  Extremities: extremities normal, atraumatic, no cyanosis or edema and Homans sign is negative, no sign of DVT  Back: Right CVA tenderness Membranes:intact  Fetal Monitoring:   Fetal doppler 157   Labs:  Results for orders placed or performed during the hospital encounter of 03/13/17 (from the past 24 hour(s))  Blood gas, arterial   Collection Time: 03/14/17 10:00 AM  Result Value Ref Range   FIO2 0.21    Delivery systems ROOM AIR    pH, Arterial 7.387 7.350 - 7.450   pCO2 arterial 28.7 (L) 32.0 - 48.0 mmHg   pO2, Arterial 110 (H) 83.0 - 108.0 mmHg   Bicarbonate 16.9 (L) 20.0 - 28.0 mmol/L   Acid-base deficit 6.8 (H) 0.0 - 2.0 mmol/L   O2 Saturation 97.0 %   Collection site LEFT RADIAL    Drawn by [phone] [id number]    Sample type ARTERIAL    Allens test (pass/fail) PASS PASS  Lactic acid, plasma   Collection Time: 03/14/17 11:06 AM  Result Value Ref Range   Lactic Acid, Venous 2.0 (HH) 0.5 - 1.9 mmol/L  CBC   Collection Time: 03/14/17 11:11 AM  Result Value Ref Range   WBC 7.8 4.0 - 10.5 K/uL   RBC 2.92 (L) 3.87 - 5.11 MIL/uL   Hemoglobin 8.1 (L) 12.0 - 15.0 g/dL   HCT 41.323.6 (L) 24.436.0 - 01.046.0 %   MCV 80.8 78.0 - 100.0 fL   MCH 27.7 26.0 - 34.0 pg   MCHC 34.3 30.0 - 36.0 g/dL   RDW 27.214.2 53.611.5 - 64.415.5 %   Platelets 83 (L) 150 - 400 K/uL  Comprehensive metabolic panel   Collection Time: 03/14/17 11:11 AM  Result Value Ref Range   Sodium 132 (L) 135 - 145 mmol/L   Potassium 3.0 (L) 3.5 - 5.1 mmol/L   Chloride 105 101 - 111 mmol/L   CO2 18 (L) 22 - 32 mmol/L   Glucose, Bld 112 (H) 65 - 99 mg/dL   BUN 36 (H) 6 - 20 mg/dL   Creatinine, Ser 0.343.06 (H) 0.44 - 1.00 mg/dL   Calcium 7.4 (L) 8.9 - 10.3 mg/dL   Total Protein 5.6 (L) 6.5 - 8.1 g/dL   Albumin 2.1 (L) 3.5 - 5.0 g/dL   AST 19 15 - 41 U/L   ALT 12 (L) 14 - 54 U/L   Alkaline Phosphatase 68 38 - 126 U/L   Total Bilirubin 0.4 0.3 - 1.2 mg/dL   GFR calc non Af [place]  21 (L) >60 mL/min   GFR calc Af Amer 24 (L) >60 mL/min   Anion gap 9 5 - 15  Blood gas, arterial   Collection Time: 03/14/17  1:16 PM  Result Value Ref Range   FIO2 100.00    O2 Content 12.0 L/min   Delivery systems NON-REBREATHER OXYGEN MASK    pH, Arterial 7.428 7.350 - 7.450   pCO2 arterial 25.2 (L) 32.0 - 48.0 mmHg   pO2, Arterial 445 (H) 83.0 - 108.0 mmHg   Bicarbonate 16.4 (L) 20.0 - 28.0 mmol/L   Acid-base deficit 6.6 (H) 0.0 - 2.0 mmol/L   O2 Saturation 100.0 %   Collection site LEFT RADIAL    Drawn by [id number]    Sample type ARTERIAL    Allens test (pass/fail) PASS PASS  CBC   Collection Time: 03/15/17  5:37 AM  Result Value Ref Range   WBC 6.1 4.0 - 10.5 K/uL   RBC 2.64 (L) 3.87 - 5.11 MIL/uL   Hemoglobin 7.5 (L) 12.0 - 15.0 g/dL   HCT 04.5 (L) 40.9 - 81.1 %   MCV 79.5 78.0 - 100.0 fL   MCH 28.4 26.0 - 34.0 pg   MCHC 35.7 30.0 - 36.0 g/dL   RDW 91.4 78.2 - 95.6 %   Platelets 78 (L) 150 - 400 K/uL  Basic  metabolic panel   Collection Time: 03/15/17  5:37 AM  Result Value Ref Range   Sodium 135 135 - 145 mmol/L   Potassium 3.0 (L) 3.5 - 5.1 mmol/L   Chloride 111 101 - 111 mmol/L   CO2 16 (L) 22 - 32 mmol/L   Glucose, Bld 77 65 - 99 mg/dL   BUN 24 (H) 6 - 20 mg/dL   Creatinine, Ser 2.13 (H) 0.44 - 1.00 mg/dL   Calcium 7.4 (L) 8.9 - 10.3 mg/dL   GFR calc non Af Amer 34 (L) >60 mL/min   GFR calc Af Amer 39 (L) >60 mL/min   Anion gap 8 5 - 15      Medications:  Scheduled . docusate sodium  100 mg Oral Daily  . prenatal multivitamin  1 tablet Oral Q1200   I have reviewed the patient's current medications.  ASSESSMENT: Patient Active Problem List   Diagnosis Date Noted  . Pyelonephritis affecting pregnancy, antepartum 03/13/2017  . Candida vaginitis 03/10/2017  . Abdominal pain affecting pregnancy 03/10/2017  . UTI (urinary tract infection) during pregnancy, second trimester 03/10/2017  . Supervision of normal first pregnancy 01/19/2017  . Late prenatal care 01/19/2017    PLAN: 21 yo G1P0 at 24weeks with pyelonephritis and sepsis 1) Ob - Fetal status stable  2) ID - Tmax 101.1 this am - UCx - E.coli - BCx x 1- gram neg rods - Continue IV antibiotics  - Consider ID consult for length of antibiotic course  3) FEN/GI - hypokalemia- will replete with KCL - Encouraged po intake - Cr improving- will discontinue foley catheter later today if urine output continues to improve  4) Pulm - tachypnea resolved - O2 sat normal on room air - lungs clear - encouraged the use of incentive spirometer  Nawaal Alling 03/15/2017,8:12 AM

## 2017-03-15 NOTE — Consult Note (Addendum)
[facility]    Date of Admission:  03/13/2017           Day 2 ceftriaxone       Reason for Consult: Escherichia coli pyelonephritis with transient bacteremia    Referring Physician: Dr. [place] Bingharlie Pickens, Montez HagemanJr  Active Problems:   Pyelonephritis affecting pregnancy, antepartum   Bacteremia due to Escherichia coli   Anemia affecting pregnancy in second trimester   . prenatal multivitamin  1 tablet Oral Q1200    Recommendations: 1. Continue ceftriaxone  2. Discontinue droplet precautions  Assessment: April Young has right pyelonephritis with transient Escherichia coli bacteremia. She is still having fever, hypotension and tachycardia but overall seems to be improving slowly. I think she also has some superimposed volume overload as well as some atelectasis. I doubt that she has concomitant pneumonia. She does not need droplet precautions. I favor continuing ceftriaxone for now. I will follow-up tomorrow afternoon.    HPI: April Young is a 21 y.o. female who is [redacted] weeks pregnant who developed acute onset of fever, chills, sweats, right flank pain, nausea and vomiting about one week ago. She was seen in the emergency department on 03/10/2017. She was diagnosed with probable pyelonephritis and given a prescription for Macrobid. Urine culture grew Escherichia coli. She was admitted on 03/13/2017 with a temperature of 102.8, hypotension and tachycardia. One of 2 blood cultures has grown Escherichia coli. She was started on empiric ceftriaxone. She is feeling a little bit better today. Her right flank pain is slightly better and she is no longer having any nausea or vomiting. She has a new, mild dry cough. She notes right flank pain when she takes a deep breath or coughs. She denies any shortness of breath. Her ultrasound showed moderate right hydronephrosis without any nephrolithiasis.   Review of Systems: Review of Systems  Constitutional: Positive  for chills, diaphoresis, fever and malaise/fatigue. Negative for weight loss.  HENT: Negative for sore throat.   Respiratory: Positive for cough. Negative for sputum production and shortness of breath.   Cardiovascular: Negative for chest pain.  Genitourinary: Positive for dysuria, flank pain and frequency.  Musculoskeletal: Positive for back pain.  Skin: Negative for rash.    Past Medical History:  Diagnosis Date  . Abdominal pain affecting pregnancy 03/10/2017  . Candida vaginitis 03/10/2017  . Hx of trichomoniasis 11/2016  . Rh negative status during pregnancy   . Urinary tract infection 03/10/2017    Social History  Substance Use Topics  . Smoking status: Never Smoker  . Smokeless tobacco: Never Used  . Alcohol use No    Family History  Problem Relation Age of Onset  . COPD Maternal Grandmother    Allergies  Allergen Reactions  . Penicillins Rash and Other (See Comments)    Has patient had a PCN reaction causing immediate rash, facial/tongue/throat swelling, SOB or lightheadedness with hypotension: No Has patient had a PCN reaction causing severe rash involving mucus membranes or skin necrosis: No Has patient had a PCN reaction that required hospitalization: No Has patient had a PCN reaction occurring within the last 10 years: No If all of the above answers are "NO", then may proceed with Cephalosporin use.    OBJECTIVE: Blood pressure (!) 87/40, pulse 97, temperature 98.6 F (37 C), temperature source Oral, resp. rate (!) 22, height 5\' 9"  (1.753 m), weight 119 lb (54 kg), last menstrual period 09/28/2016, SpO2 97 %.  Physical Exam  Constitutional: She is oriented to person, place, and time.  She is resting quietly in bed. She appears slightly uncomfortable.  Cardiovascular: Regular rhythm.   No murmur heard. She is tachycardic.  Pulmonary/Chest: Effort normal. She has no wheezes. She has rales.  Some crackles in right base posteriorly.  Abdominal: Soft. There is  no tenderness.  Musculoskeletal: Normal range of motion. She exhibits no edema or tenderness.  Neurological: She is alert and oriented to person, place, and time.  Skin: No rash noted.  Psychiatric: Mood and affect normal.    Lab Results Lab Results  Component Value Date   WBC 6.9 03/15/2017   HGB 7.7 (L) 03/15/2017   HCT 22.2 (L) 03/15/2017   MCV 80.7 03/15/2017   PLT 76 (L) 03/15/2017    Lab Results  Component Value Date   CREATININE 1.95 (H) 03/15/2017   BUN 22 (H) 03/15/2017   NA 134 (L) 03/15/2017   K 3.2 (L) 03/15/2017   CL 111 03/15/2017   CO2 15 (L) 03/15/2017    Lab Results  Component Value Date   ALT 10 (L) 03/15/2017   AST 14 (L) 03/15/2017   ALKPHOS 67 03/15/2017   BILITOT 0.6 03/15/2017     Microbiology: Recent Results (from the past 240 hour(s))  Urine culture     Status: Abnormal   Collection Time: 03/10/17 10:21 AM  Result Value Ref Range Status   Specimen Description URINE, CLEAN CATCH  Final   Special Requests Normal  Final   Culture >=100,000 COLONIES/mL ESCHERICHIA COLI (A)  Final   Report Status 03/12/2017 FINAL  Final   Organism ID, Bacteria ESCHERICHIA COLI (A)  Final      Susceptibility   Escherichia coli - MIC*    AMPICILLIN >=32 RESISTANT Resistant     CEFAZOLIN <=4 SENSITIVE Sensitive     CEFTRIAXONE <=1 SENSITIVE Sensitive     CIPROFLOXACIN <=0.25 SENSITIVE Sensitive     GENTAMICIN <=1 SENSITIVE Sensitive     IMIPENEM <=0.25 SENSITIVE Sensitive     NITROFURANTOIN <=16 SENSITIVE Sensitive     TRIMETH/SULFA <=20 SENSITIVE Sensitive     AMPICILLIN/SULBACTAM 16 INTERMEDIATE Intermediate     PIP/TAZO <=4 SENSITIVE Sensitive     Extended ESBL NEGATIVE Sensitive     * >=100,000 COLONIES/mL ESCHERICHIA COLI  Wet prep, genital     Status: Abnormal   Collection Time: 03/10/17 10:35 AM  Result Value Ref Range Status   Yeast Wet Prep HPF POC PRESENT (A) NONE SEEN Final   Trich, Wet Prep NONE SEEN NONE SEEN Final   Clue Cells Wet Prep HPF  POC NONE SEEN NONE SEEN Final   WBC, Wet Prep HPF POC MANY (A) NONE SEEN Final    Comment: MANY BACTERIA SEEN   Sperm NONE SEEN  Final  Culture, blood (Routine X 2) w Reflex to ID Panel     Status: Abnormal (Preliminary result)   Collection Time: 03/13/17  8:47 PM  Result Value Ref Range Status   Specimen Description BLOOD LEFT ARM  Final   Special Requests   Final    BOTTLES DRAWN AEROBIC AND ANAEROBIC Blood Culture adequate volume   Culture  Setup Time   Final    GRAM NEGATIVE RODS IN BOTH AEROBIC AND ANAEROBIC BOTTLES CRITICAL RESULT CALLED TO, READ BACK BY AND VERIFIED WITH: J MENDENHALL,PHARMD AT 1320 03/14/17 BY L BENFIELD    Culture (A)  Final    ESCHERICHIA COLI SUSCEPTIBILITIES TO FOLLOW Performed at [facility], [address] [place], [place] [address]    Report Status PENDING  Incomplete  Blood Culture ID Panel (Reflexed)     Status: Abnormal   Collection Time: 03/13/17  8:47 PM  Result Value Ref Range Status   Enterococcus species NOT DETECTED NOT DETECTED Final   Listeria monocytogenes NOT DETECTED NOT DETECTED Final   Staphylococcus species NOT DETECTED NOT DETECTED Final   Staphylococcus aureus NOT DETECTED NOT DETECTED Final   Streptococcus species NOT DETECTED NOT DETECTED Final   Streptococcus agalactiae NOT DETECTED NOT DETECTED Final   Streptococcus pneumoniae NOT DETECTED NOT DETECTED Final   Streptococcus pyogenes NOT DETECTED NOT DETECTED Final   Acinetobacter baumannii NOT DETECTED NOT DETECTED Final   Enterobacteriaceae species DETECTED (A) NOT DETECTED Final    Comment: Enterobacteriaceae represent a large family of gram-negative bacteria, not a single organism. CRITICAL RESULT CALLED TO, READ BACK BY AND VERIFIED WITH: J MENDENHALL,PHARMD AT 1320 03/14/17 BY L BENFIELD    Enterobacter cloacae complex NOT DETECTED NOT DETECTED Final   Escherichia coli DETECTED (A) NOT DETECTED Final    Comment: CRITICAL RESULT CALLED TO, READ BACK BY AND VERIFIED  WITH: J MENDENHALL,PHARMD AT 1320 03/14/17 BY L BENFIELD    Klebsiella oxytoca NOT DETECTED NOT DETECTED Final   Klebsiella pneumoniae NOT DETECTED NOT DETECTED Final   Proteus species NOT DETECTED NOT DETECTED Final   Serratia marcescens NOT DETECTED NOT DETECTED Final   Carbapenem resistance NOT DETECTED NOT DETECTED Final   Haemophilus influenzae NOT DETECTED NOT DETECTED Final   Neisseria meningitidis NOT DETECTED NOT DETECTED Final   Pseudomonas aeruginosa NOT DETECTED NOT DETECTED Final   Candida albicans NOT DETECTED NOT DETECTED Final   Candida glabrata NOT DETECTED NOT DETECTED Final   Candida krusei NOT DETECTED NOT DETECTED Final   Candida parapsilosis NOT DETECTED NOT DETECTED Final   Candida tropicalis NOT DETECTED NOT DETECTED Final    Comment: Performed at [facility], [address] [place], [place] [address]  Culture, blood (Routine X 2) w Reflex to ID Panel     Status: None (Preliminary result)   Collection Time: 03/13/17  8:55 PM  Result Value Ref Range Status   Specimen Description BLOOD LEFT HAND  Final   Special Requests   Final    BOTTLES DRAWN AEROBIC ONLY Blood Culture adequate volume   Culture   Final    NO GROWTH 1 DAY Performed at [facility], [address] [place], [place] [address]    Report Status PENDING  Incomplete    Cliffton Asters, MD [facility] for Infectious Disease [facility] [phone] pager   [phone] cell 03/15/2017, 3:27 PM

## 2017-03-15 NOTE — Progress Notes (Addendum)
Daily Antepartum Note  Admission Date: 03/13/2017 Current Date: 03/15/2017 9:43 AM  April Young is a 21 y.o. G1P0000 @ [redacted]w[redacted]d, HD#3, admitted for right pyelo.  Pregnancy complicated by: Patient Active Problem List   Diagnosis Date Noted  . Pyelonephritis affecting pregnancy, antepartum 03/13/2017  . Candida vaginitis 03/10/2017  . Abdominal pain affecting pregnancy 03/10/2017  . UTI (urinary tract infection) during pregnancy, second trimester 03/10/2017  . Supervision of normal first pregnancy 01/19/2017  . Late prenatal care 01/19/2017    Overnight/24hr events:  See prior notes  Subjective:  No chest pain, SOB, preterm labor s/s or decreased FM or fevers. Pt with occasional non productive cough.   Objective:    Intake/Output Summary (Last 24 hours) at 03/15/17 0943 Last data filed at 03/15/17 0911  Gross per 24 hour  Intake          4451.67 ml  Output             1695 ml  Net          2756.67 ml     Current Vital Signs 24h Vital Sign Ranges  T (!) 101.1 F (38.4 C) (Nurse Raynelle Fanning in room and notified of rising temp) Temp  Avg: 99 F (37.2 C)  Min: 97.7 F (36.5 C)  Max: 101.1 F (38.4 C)  BP (!) 95/51 BP  Min: 53/31  Max: 108/72  HR (!) 102 Pulse  Avg: 105.8  Min: 92  Max: 165  RR 16 Resp  Avg: 22.2  Min: 16  Max: 32  SaO2 97 % Not Delivered SpO2  Avg: 96.8 %  Min: 82 %  Max: 100 %       24 Hour I/O Current Shift I/O  Time Ins Outs 06/03 0701 - 06/04 0700 In: 4451.7 [P.O.:1240; I.V.:3011.7] Out: 1745 [Urine:1745] 06/04 0701 - 06/04 1900 In: -  Out: 250 [Urine:250]   Patient Vitals for the past 24 hrs:  BP Temp Temp src Pulse Resp SpO2  03/15/17 0753 - (!) 101.1 F (38.4 C) Oral - - -  03/15/17 0600 (!) 95/51 - - (!) 102 16 97 %  03/15/17 0543 (!) 94/43 99.5 F (37.5 C) Oral (!) 107 16 93 %  03/15/17 0500 - - - (!) 102 - 91 %  03/15/17 0400 (!) 85/44 - - (!) 117 18 95 %  03/15/17 0300 - - - (!) 117 16 94 %  03/15/17 0200 (!) 97/39 - - (!) 122 16 95 %   03/15/17 0150 (!) 95/44 - - (!) 126 18 94 %  03/15/17 0100 (!) 79/47 - - (!) 114 18 95 %  03/15/17 0000 (!) 87/46 - - (!) 104 18 93 %  03/14/17 2300 108/72 97.7 F (36.5 C) Oral (!) 109 18 99 %  03/14/17 2200 - - - (!) 103 18 98 %  03/14/17 2100 (!) 90/45 - - 94 18 98 %  03/14/17 2027 (!) 89/51 97.8 F (36.6 C) Oral 93 18 97 %  03/14/17 2000 (!) 89/51 - - 93 18 98 %  03/14/17 1930 (!) 83/37 - - 94 18 98 %  03/14/17 1908 - - - 97 - 99 %  03/14/17 1907 - - - 98 - 98 %  03/14/17 1849 - - - (!) 110 - 100 %  03/14/17 1848 - - - (!) 110 - 100 %  03/14/17 1841 - - - (!) 102 - 99 %  03/14/17 1840 - - - (!) 102 - 99 %  03/14/17 1839 - - - 100 - 99 %  03/14/17 1835 - - - 100 - 99 %  03/14/17 1834 - - - 92 - 100 %  03/14/17 1829 - - - (!) 103 (!) 26 100 %  03/14/17 1828 - - - (!) 101 - 100 %  03/14/17 1822 - - - (!) 102 - 100 %  03/14/17 1820 (!) 65/53 - - 99 - 100 %  03/14/17 1819 - - - (!) 101 - 100 %  03/14/17 1757 (!) 90/49 - - 98 (!) 27 98 %  03/14/17 1756 (!) 100/17 - - 95 - -  03/14/17 1733 (!) 92/34 - - (!) 103 - 98 %  03/14/17 1702 (!) 53/31 98 F (36.7 C) Oral 99 (!) 27 97 %  03/14/17 1659 - - - - - 96 %  03/14/17 1645 (!) 59/42 - - (!) 111 (!) 26 98 %  03/14/17 1642 (!) 60/26 - - 96 - -  03/14/17 1631 (!) 62/29 - - (!) 102 - -  03/14/17 1609 (!) 71/37 - - (!) 102 (!) 26 98 %  03/14/17 1601 (!) 69/45 - - (!) 113 - -  03/14/17 1531 (!) 71/31 - - (!) 123 (!) 28 99 %  03/14/17 1503 (!) 80/41 99.6 F (37.6 C) Oral (!) 121 (!) 30 99 %  03/14/17 1431 (!) 87/37 - - (!) 116 (!) 31 98 %  03/14/17 1426 (!) 80/45 - - (!) 117 (!) 30 -  03/14/17 1414 (!) 59/43 100.3 F (37.9 C) Oral (!) 117 (!) 32 97 %  03/14/17 1351 - - - - - 98 %  03/14/17 1346 - - - - - 100 %  03/14/17 1341 - - - - - 100 %  03/14/17 1331 (!) 82/41 - - (!) 165 - -  03/14/17 1321 - - - - (!) 30 99 %  03/14/17 1301 (!) 88/60 - - (!) 106 (!) 28 (!) 89 %  03/14/17 1231 (!) 94/57 - - (!) 101 (!) 21 99 %  03/14/17  1226 - - - - - 98 %  03/14/17 1220 - - - - - 94 %  03/14/17 1215 - - - - - 95 %  03/14/17 1211 - - - - - 93 %  03/14/17 1203 - - - - - 96 %  03/14/17 1201 (!) 87/67 98.1 F (36.7 C) - (!) 102 (!) 22 (!) 86 %  03/14/17 1141 - - - - 20 -  03/14/17 1131 (!) 76/48 - - 97 - (!) 82 %  03/14/17 1101 (!) 86/48 - - 96 (!) 21 -  03/14/17 1032 (!) 85/42 - - 98 20 -  03/14/17 1000 (!) 82/45 - - (!) 105 - -   UOP: approx 112mL/hr  Physical exam: General: Well nourished, well developed female in no acute distress. Abdomen: gravid nttp Back: mild cvat on the right Cardiovascular: S1, S2 normal, no murmur, rub or gallop, regular rate and rhythm. HR 100s GU: clear UOP in foley Respiratory: CTAB, no resp distress Extremities: no clubbing, cyanosis or edema Skin: Warm and dry.   Medications: Current Facility-Administered Medications  Medication Dose Route Frequency Provider Last Rate Last Dose  . 0.9 %  sodium chloride infusion   Intravenous Continuous [place] Bing, MD 125 mL/hr at 03/15/17 1610    . acetaminophen (TYLENOL) tablet 650 mg  650 mg Oral Q4H PRN Adam Phenix, MD   650 mg at 03/15/17 0809  . calcium carbonate (  TUMS - dosed in mg elemental calcium) chewable tablet 400 mg of elemental calcium  2 tablet Oral Q4H PRN Adam PhenixArnold, James G, MD   400 mg of elemental calcium at 03/14/17 1758  . cefTRIAXone (ROCEPHIN) 2 g in dextrose 5 % 50 mL IVPB  2 g Intravenous Q24H Adam PhenixArnold, James G, MD   Stopped at 03/14/17 2302  . diphenhydrAMINE (BENADRYL) injection 25 mg  25 mg Intravenous Q6H PRN Adam PhenixArnold, James G, MD      . docusate sodium (COLACE) capsule 100 mg  100 mg Oral Daily Adam PhenixArnold, James G, MD      . ondansetron [facility](ZOFRAN) injection 4 mg  4 mg Intravenous Q8H PRN Constant, Peggy, MD   4 mg at 03/14/17 2101  . potassium chloride 10 mEq in 100 mL IVPB  10 mEq Intravenous Q1 Hr x 3 Constant, Peggy, MD 100 mL/hr at 03/15/17 0811 10 mEq at 03/15/17 0811  . prenatal multivitamin tablet 1 tablet  1 tablet  Oral Q1200 Adam PhenixArnold, James G, MD      . traMADol Janean Sark(ULTRAM) tablet 50 mg  50 mg Oral Q6H PRN Adam PhenixArnold, James G, MD   50 mg at 03/15/17 0211    Labs:  No new labs  Radiology: no new imaging  Assessment & Plan:  Pt stable *Pregnancy: no issues currently will do nst qday.  *ID: will repeat cxr 2v, cbc, cmp and renal u/s. Consult ID after studies are back. Continue rocephin.  *CM on CXR: will get screening ECG. Maternal echo in the future and prn s/s.  *Pulm: see above. [place] added to keep o2 sats above 94 *ID: see above *Heme: f/u am labs. May transfuse if still anemic *AKI: see above. Improving cr and good UOP. *Preterm: no issues currently *PPx: scds *FEN/GI: regular diet. Will cut MIVF to 75/hr given I/O. Will get ionized cal and pt getting KCL for low K *Dispo: pending clinical course.   Cornelia Copaharlie Rocklin Soderquist, Jr. MD Attending Center for [facility](Faculty Practice)

## 2017-03-16 ENCOUNTER — Inpatient Hospital Stay (HOSPITAL_COMMUNITY): Payer: Medicaid Other

## 2017-03-16 ENCOUNTER — Encounter (HOSPITAL_COMMUNITY): Payer: Self-pay | Admitting: *Deleted

## 2017-03-16 DIAGNOSIS — O99119 Other diseases of the blood and blood-forming organs and certain disorders involving the immune mechanism complicating pregnancy, unspecified trimester: Secondary | ICD-10-CM

## 2017-03-16 DIAGNOSIS — D696 Thrombocytopenia, unspecified: Secondary | ICD-10-CM | POA: Diagnosis present

## 2017-03-16 DIAGNOSIS — O9989 Other specified diseases and conditions complicating pregnancy, childbirth and the puerperium: Secondary | ICD-10-CM

## 2017-03-16 DIAGNOSIS — R7881 Bacteremia: Secondary | ICD-10-CM

## 2017-03-16 DIAGNOSIS — I360 Nonrheumatic tricuspid (valve) stenosis: Secondary | ICD-10-CM

## 2017-03-16 DIAGNOSIS — I340 Nonrheumatic mitral (valve) insufficiency: Secondary | ICD-10-CM

## 2017-03-16 LAB — CBC
HCT: 21.6 % — ABNORMAL LOW (ref 36.0–46.0)
HEMATOCRIT: 29.6 % — AB (ref 36.0–46.0)
Hemoglobin: 7.5 g/dL — ABNORMAL LOW (ref 12.0–15.0)
Hemoglobin: 10.3 g/dL — ABNORMAL LOW (ref 12.0–15.0)
MCH: 28 pg (ref 26.0–34.0)
MCH: 28.1 pg (ref 26.0–34.0)
MCHC: 34.7 g/dL (ref 30.0–36.0)
MCHC: 34.8 g/dL (ref 30.0–36.0)
MCV: 80.6 fL (ref 78.0–100.0)
MCV: 80.7 fL (ref 78.0–100.0)
PLATELETS: 76 10*3/uL — AB (ref 150–400)
Platelets: 74 10*3/uL — ABNORMAL LOW (ref 150–400)
RBC: 2.68 MIL/uL — AB (ref 3.87–5.11)
RBC: 3.67 MIL/uL — ABNORMAL LOW (ref 3.87–5.11)
RDW: 14.8 % (ref 11.5–15.5)
RDW: 14.8 % (ref 11.5–15.5)
WBC: 7.8 10*3/uL (ref 4.0–10.5)
WBC: 9.2 10*3/uL (ref 4.0–10.5)

## 2017-03-16 LAB — BASIC METABOLIC PANEL
Anion gap: 7 (ref 5–15)
BUN: 18 mg/dL (ref 6–20)
CALCIUM: 8 mg/dL — AB (ref 8.9–10.3)
CHLORIDE: 110 mmol/L (ref 101–111)
CO2: 18 mmol/L — ABNORMAL LOW (ref 22–32)
CREATININE: 1.67 mg/dL — AB (ref 0.44–1.00)
GFR, EST AFRICAN AMERICAN: 51 mL/min — AB (ref >60)
GFR, EST NON AFRICAN AMERICAN: 44 mL/min — AB (ref >60)
Glucose, Bld: 80 mg/dL (ref 65–99)
Potassium: 3.1 mmol/L — ABNORMAL LOW (ref 3.5–5.1)
SODIUM: 135 mmol/L (ref 135–145)

## 2017-03-16 LAB — CALCIUM, IONIZED: CALCIUM, IONIZED, SERUM: 4.7 mg/dL (ref 4.5–5.6)

## 2017-03-16 LAB — CULTURE, BLOOD (ROUTINE X 2): SPECIAL REQUESTS: ADEQUATE

## 2017-03-16 LAB — ECHOCARDIOGRAM COMPLETE
Height: 69 in
WEIGHTICAEL: 1904 [oz_av]

## 2017-03-16 LAB — PREPARE RBC (CROSSMATCH)

## 2017-03-16 LAB — TYPE AND SCREEN
ABO/RH(D): A NEG
ANTIBODY SCREEN: NEGATIVE

## 2017-03-16 MED ORDER — SODIUM CHLORIDE 0.9 % IV SOLN: Freq: Once | INTRAVENOUS | Status: DC

## 2017-03-16 MED ORDER — POTASSIUM CHLORIDE CRYS ER 20 MEQ PO TBCR
30.0000 meq | EXTENDED_RELEASE_TABLET | Freq: Three times a day (TID) | ORAL | Status: AC
  Administered 2017-03-16 – 2017-03-17 (×4): 30 meq via ORAL
  Filled 2017-03-16 (×6): qty 1

## 2017-03-16 MED ORDER — DIPHENHYDRAMINE HCL 25 MG PO CAPS
25.0000 mg | ORAL_CAPSULE | Freq: Once | ORAL | Status: AC
  Administered 2017-03-16: 25 mg via ORAL
  Filled 2017-03-16: qty 1

## 2017-03-16 MED ORDER — FUROSEMIDE 10 MG/ML IJ SOLN
20.0000 mg | Freq: Once | INTRAMUSCULAR | Status: AC
  Administered 2017-03-16: 20 mg via INTRAVENOUS
  Filled 2017-03-16: qty 2

## 2017-03-16 MED ORDER — ACETAMINOPHEN 325 MG PO TABS
650.0000 mg | ORAL_TABLET | Freq: Once | ORAL | Status: AC
  Administered 2017-03-16: 650 mg via ORAL
  Filled 2017-03-16: qty 2

## 2017-03-16 MED ORDER — ACETAMINOPHEN 325 MG PO TABS: 650.0000 mg | ORAL_TABLET | Freq: Once | ORAL | Status: DC

## 2017-03-16 MED ORDER — DIPHENHYDRAMINE HCL 25 MG PO CAPS: 25.0000 mg | ORAL_CAPSULE | Freq: Once | ORAL | Status: DC

## 2017-03-16 NOTE — Progress Notes (Signed)
Patient Name: April Young, female   DOB: 07-20-1996, 21 y.o.  MRN: [id number]  Due to pt's illness and low hemoglobin, recommend transfusion. Discussed blood transfusion with pt - risks discussed including transfusion rxn and Ix diseases. Premedicate with tylenol and benadryl.   Levie HeritageStinson, Jacob J, DO 03/16/2017 9:42 AM

## 2017-03-16 NOTE — Progress Notes (Signed)
  Echocardiogram 2D Echocardiogram has been performed.  Janalyn HarderWest, Yasiel Goyne R 03/16/2017, 9:19 AM

## 2017-03-16 NOTE — Progress Notes (Signed)
Patient ID: April ByarsCierra N Soliz, female   DOB: 06-16-1996, 21 y.o.   MRN: [id number]          [facility]  Date of Admission:  03/13/2017           Day 3 ceftriaxone  Active Problems:   Pyelonephritis affecting pregnancy, antepartum   Bacteremia due to Escherichia coli   Anemia affecting pregnancy in second trimester   Gestational thrombocytopenia (HCC)   . potassium chloride  30 mEq Oral TID AC  . prenatal multivitamin  1 tablet Oral Q1200    SUBJECTIVE: She is feeling better. She had some mild chills and sweats overnight. She is no longer having any flank pain. She is not having any more nausea or vomiting. She just ate a large plate of fried chicken.  Review of Systems: Review of Systems  Constitutional: Positive for chills, diaphoresis and fever.  Respiratory: Positive for cough. Negative for sputum production and shortness of breath.   Cardiovascular: Negative for chest pain.  Gastrointestinal: Negative for abdominal pain, diarrhea, nausea and vomiting.  Genitourinary: Negative for dysuria, frequency and urgency.    Past Medical History:  Diagnosis Date  . Abdominal pain affecting pregnancy 03/10/2017  . Candida vaginitis 03/10/2017  . Hx of trichomoniasis 11/2016  . Rh negative status during pregnancy   . Urinary tract infection 03/10/2017    Social History  Substance Use Topics  . Smoking status: Never Smoker  . Smokeless tobacco: Never Used  . Alcohol use No    Family History  Problem Relation Age of Onset  . COPD Maternal Grandmother    Allergies  Allergen Reactions  . Penicillins Rash and Other (See Comments)    03/2017: took rocephin w/o issue. Has patient had a PCN reaction causing immediate rash, facial/tongue/throat swelling, SOB or lightheadedness with hypotension: No Has patient had a PCN reaction causing severe rash involving mucus membranes or skin necrosis: No Has patient had a PCN reaction that required  hospitalization: No Has patient had a PCN reaction occurring within the last 10 years: No If all of the above answers are "NO", then may proceed with Cephalosporin use.    OBJECTIVE: Vitals:   03/16/17 1409 03/16/17 1424 03/16/17 1500 03/16/17 1556  BP:  (!) 94/56    Pulse: 93  86 93  Resp:  20    Temp:  97.7 F (36.5 C)    TempSrc:  Oral    SpO2: 100%  100% 99%  Weight:      Height:       Body mass index is 17.57 kg/m.  Physical Exam  Constitutional:  She is resting quietly in bed. She is in no distress.  Cardiovascular: Normal rate and regular rhythm.   No murmur heard. Pulmonary/Chest: Effort normal and breath sounds normal. She has no wheezes. She has no rales.  Abdominal: Soft. There is no tenderness.  Psychiatric:  Flat affect.    Lab Results Lab Results  Component Value Date   WBC 7.8 03/16/2017   HGB 7.5 (L) 03/16/2017   HCT 21.6 (L) 03/16/2017   MCV 80.6 03/16/2017   PLT 74 (L) 03/16/2017    Lab Results  Component Value Date   CREATININE 1.67 (H) 03/16/2017   BUN 18 03/16/2017   NA 135 03/16/2017   K 3.1 (L) 03/16/2017   CL 110 03/16/2017   CO2 18 (L) 03/16/2017    Lab Results  Component Value Date   ALT 10 (L) 03/15/2017   AST  14 (L) 03/15/2017   ALKPHOS 67 03/15/2017   BILITOT 0.6 03/15/2017     Microbiology: Recent Results (from the past 240 hour(s))  Urine culture     Status: Abnormal   Collection Time: 03/10/17 10:21 AM  Result Value Ref Range Status   Specimen Description URINE, CLEAN CATCH  Final   Special Requests Normal  Final   Culture >=100,000 COLONIES/mL ESCHERICHIA COLI (A)  Final   Report Status 03/12/2017 FINAL  Final   Organism ID, Bacteria ESCHERICHIA COLI (A)  Final      Susceptibility   Escherichia coli - MIC*    AMPICILLIN >=32 RESISTANT Resistant     CEFAZOLIN <=4 SENSITIVE Sensitive     CEFTRIAXONE <=1 SENSITIVE Sensitive     CIPROFLOXACIN <=0.25 SENSITIVE Sensitive     GENTAMICIN <=1 SENSITIVE Sensitive      IMIPENEM <=0.25 SENSITIVE Sensitive     NITROFURANTOIN <=16 SENSITIVE Sensitive     TRIMETH/SULFA <=20 SENSITIVE Sensitive     AMPICILLIN/SULBACTAM 16 INTERMEDIATE Intermediate     PIP/TAZO <=4 SENSITIVE Sensitive     Extended ESBL NEGATIVE Sensitive     * >=100,000 COLONIES/mL ESCHERICHIA COLI  Wet prep, genital     Status: Abnormal   Collection Time: 03/10/17 10:35 AM  Result Value Ref Range Status   Yeast Wet Prep HPF POC PRESENT (A) NONE SEEN Final   Trich, Wet Prep NONE SEEN NONE SEEN Final   Clue Cells Wet Prep HPF POC NONE SEEN NONE SEEN Final   WBC, Wet Prep HPF POC MANY (A) NONE SEEN Final    Comment: MANY BACTERIA SEEN   Sperm NONE SEEN  Final  Culture, blood (Routine X 2) w Reflex to ID Panel     Status: Abnormal   Collection Time: 03/13/17  8:47 PM  Result Value Ref Range Status   Specimen Description BLOOD LEFT ARM  Final   Special Requests   Final    BOTTLES DRAWN AEROBIC AND ANAEROBIC Blood Culture adequate volume   Culture  Setup Time   Final    GRAM NEGATIVE RODS IN BOTH AEROBIC AND ANAEROBIC BOTTLES CRITICAL RESULT CALLED TO, READ BACK BY AND VERIFIED WITH: J MENDENHALL,PHARMD AT 1320 03/14/17 BY L BENFIELD Performed at [facility], [address] [place], [place] [address]    Culture ESCHERICHIA COLI (A)  Final   Report Status 03/16/2017 FINAL  Final   Organism ID, Bacteria ESCHERICHIA COLI  Final      Susceptibility   Escherichia coli - MIC*    AMPICILLIN >=32 RESISTANT Resistant     CEFAZOLIN <=4 SENSITIVE Sensitive     CEFEPIME <=1 SENSITIVE Sensitive     CEFTAZIDIME <=1 SENSITIVE Sensitive     CEFTRIAXONE <=1 SENSITIVE Sensitive     CIPROFLOXACIN <=0.25 SENSITIVE Sensitive     GENTAMICIN <=1 SENSITIVE Sensitive     IMIPENEM <=0.25 SENSITIVE Sensitive     TRIMETH/SULFA <=20 SENSITIVE Sensitive     AMPICILLIN/SULBACTAM 16 INTERMEDIATE Intermediate     PIP/TAZO <=4 SENSITIVE Sensitive     Extended ESBL NEGATIVE Sensitive     * ESCHERICHIA COLI    Blood Culture ID Panel (Reflexed)     Status: Abnormal   Collection Time: 03/13/17  8:47 PM  Result Value Ref Range Status   Enterococcus species NOT DETECTED NOT DETECTED Final   Listeria monocytogenes NOT DETECTED NOT DETECTED Final   Staphylococcus species NOT DETECTED NOT DETECTED Final   Staphylococcus aureus NOT DETECTED NOT DETECTED Final  Streptococcus species NOT DETECTED NOT DETECTED Final   Streptococcus agalactiae NOT DETECTED NOT DETECTED Final   Streptococcus pneumoniae NOT DETECTED NOT DETECTED Final   Streptococcus pyogenes NOT DETECTED NOT DETECTED Final   Acinetobacter baumannii NOT DETECTED NOT DETECTED Final   Enterobacteriaceae species DETECTED (A) NOT DETECTED Final    Comment: Enterobacteriaceae represent a large family of gram-negative bacteria, not a single organism. CRITICAL RESULT CALLED TO, READ BACK BY AND VERIFIED WITH: J MENDENHALL,PHARMD AT 1320 03/14/17 BY L BENFIELD    Enterobacter cloacae complex NOT DETECTED NOT DETECTED Final   Escherichia coli DETECTED (A) NOT DETECTED Final    Comment: CRITICAL RESULT CALLED TO, READ BACK BY AND VERIFIED WITH: J MENDENHALL,PHARMD AT 1320 03/14/17 BY L BENFIELD    Klebsiella oxytoca NOT DETECTED NOT DETECTED Final   Klebsiella pneumoniae NOT DETECTED NOT DETECTED Final   Proteus species NOT DETECTED NOT DETECTED Final   Serratia marcescens NOT DETECTED NOT DETECTED Final   Carbapenem resistance NOT DETECTED NOT DETECTED Final   Haemophilus influenzae NOT DETECTED NOT DETECTED Final   Neisseria meningitidis NOT DETECTED NOT DETECTED Final   Pseudomonas aeruginosa NOT DETECTED NOT DETECTED Final   Candida albicans NOT DETECTED NOT DETECTED Final   Candida glabrata NOT DETECTED NOT DETECTED Final   Candida krusei NOT DETECTED NOT DETECTED Final   Candida parapsilosis NOT DETECTED NOT DETECTED Final   Candida tropicalis NOT DETECTED NOT DETECTED Final    Comment: Performed at [facility], [address], [place], [place] [address]  Culture, blood (Routine X 2) w Reflex to ID Panel     Status: None (Preliminary result)   Collection Time: 03/13/17  8:55 PM  Result Value Ref Range Status   Specimen Description BLOOD LEFT HAND  Final   Special Requests   Final    BOTTLES DRAWN AEROBIC ONLY Blood Culture adequate volume   Culture  Setup Time   Final    GRAM NEGATIVE RODS AEROBIC BOTTLE ONLY CRITICAL VALUE NOTED.  VALUE IS CONSISTENT WITH PREVIOUSLY REPORTED AND CALLED VALUE.    Culture   Final    GRAM NEGATIVE RODS IDENTIFICATION AND SUSCEPTIBILITIES TO FOLLOW Performed at [facility], [address], [place], [place] [address]    Report Status PENDING  Incomplete     ASSESSMENT: She is improving slowly on therapy for Escherichia coli pyelonephritis and transient bacteremia.  PLAN: 1. Continue ceftriaxone for now. She will probably be ready for conversion to oral antibiotic therapy soon. 2. I will follow-up tomorrow morning  Cliffton Asters, MD [facility] [phone] pager   [phone] cell 03/16/2017, 4:32 PM

## 2017-03-16 NOTE — Progress Notes (Signed)
Pt's mother requesting letter for her work.  Printed letter attached to chart and gave directly to the patient.

## 2017-03-16 NOTE — Progress Notes (Addendum)
Daily Antepartum Note  Admission Date: 03/13/2017 Current Date: 03/16/2017 7:25 AM  April Young is a 21 y.o. G1 @ 1534w1d, HD#3, admitted for right pyelo.  Pregnancy complicated by: Patient Active Problem List   Diagnosis Date Noted  . Anemia affecting pregnancy in second trimester 03/15/2017  . Bacteremia due to Escherichia coli 03/15/2017  . Pyelonephritis affecting pregnancy, antepartum 03/13/2017  . Supervision of normal first pregnancy 01/19/2017  . Late prenatal care 01/19/2017    Overnight/24hr events:  Had temp to 39.2 @ 0147 and came down on it's own. Temp found with routine VS surveillance (pt asymptomatic)  Subjective:  Patient sleeping  Objective:    Current Vital Signs 24h Vital Sign Ranges  T 97.7 F (36.5 C) Temp  Avg: 99.7 F (37.6 C)  Min: 97.6 F (36.4 C)  Max: 102.5 F (39.2 C)  BP (!) 94/38 BP  Min: 87/40  Max: 103/64  HR (!) 104 Pulse  Avg: 110.5  Min: 97  Max: 120  RR (!) 22 Resp  Avg: 23.3  Min: 20  Max: 36  SaO2 98 % Not Delivered SpO2  Avg: 93.9 %  Min: 82 %  Max: 99 %       24 Hour I/O Current Shift I/O  Time Ins Outs 06/04 0701 - 06/05 0700 In: 2045 [P.O.:1080; I.V.:665] Out: 1630 [Urine:1630] No intake/output data recorded.   Patient Vitals for the past 24 hrs:  BP Temp Temp src Pulse Resp SpO2  03/16/17 0600 - 97.7 F (36.5 C) Oral - (!) 22 -  03/16/17 0400 - - - - 20 -  03/16/17 0300 - 99.4 F (37.4 C) Oral - - -  03/16/17 0200 - - - - (!) 24 -  03/16/17 0150 (!) 94/38 - - - - -  03/16/17 0147 (!) 89/42 (!) 102.5 F (39.2 C) Oral - (!) 22 -  03/15/17 2200 - 98.8 F (37.1 C) Oral - 20 -  03/15/17 1953 - 97.6 F (36.4 C) Oral (!) 104 (!) 22 98 %  03/15/17 1836 - - - (!) 104 - 97 %  03/15/17 1807 (!) 93/43 - - (!) 114 - 90 %  03/15/17 1800 - 99.4 F (37.4 C) Oral (!) 118 (!) 30 92 %  03/15/17 1711 - (!) 101.9 F (38.8 C) Oral (!) 120 (!) 36 93 %  03/15/17 1606 - - - (!) 113 - 99 %  03/15/17 1605 - - - (!) 119 - (!) 88 %   03/15/17 1600 103/64 - - (!) 116 - (!) 82 %  03/15/17 1545 - (!) 100.7 F (38.2 C) - (!) 117 (!) 24 (!) 89 %  03/15/17 1500 - - - (!) 110 (!) 22 98 %  03/15/17 1400 (!) 87/40 - - 97 (!) 22 97 %  03/15/17 1300 - - - (!) 105 (!) 22 98 %  03/15/17 1214 103/66 - - (!) 108 - 97 %  03/15/17 1200 - - - (!) 108 20 96 %  03/15/17 1149 - 98.6 F (37 C) Oral - - -  03/15/17 1000 - - - (!) 107 (!) 22 96 %  03/15/17 0948 - 99.1 F (37.3 C) Oral - - -  03/15/17 0900 - - - (!) 112 (!) 22 (!) 89 %  03/15/17 0800 102/61 - - (!) 107 (!) 22 97 %  03/15/17 0753 - (!) 101.1 F (38.4 C) Oral - - -   UOP: >17400mL/hr  Physical exam: General: Well nourished, well  developed female in no acute distress. Sleeping GU: Clear UOP in foley Ext: SCDs on   Medications: Current Facility-Administered Medications  Medication Dose Route Frequency Provider Last Rate Last Dose  . acetaminophen (TYLENOL) tablet 650 mg  650 mg Oral Q4H PRN Adam Phenix, MD   650 mg at 03/16/17 0200  . calcium carbonate (TUMS - dosed in mg elemental calcium) chewable tablet 400 mg of elemental calcium  2 tablet Oral Q4H PRN Adam Phenix, MD   400 mg of elemental calcium at 03/14/17 1758  . cefTRIAXone (ROCEPHIN) 2 g in dextrose 5 % 50 mL IVPB  2 g Intravenous Q24H Adam Phenix, MD   Stopped at 03/15/17 2220  . diphenhydrAMINE (BENADRYL) injection 25 mg  25 mg Intravenous Q6H PRN Adam Phenix, MD      . docusate sodium (COLACE) capsule 100 mg  100 mg Oral Daily PRN [place] Bing, MD      . ondansetron [facility]) injection 4 mg  4 mg Intravenous Q8H PRN Constant, Peggy, MD   4 mg at 03/14/17 2101  . prenatal multivitamin tablet 1 tablet  1 tablet Oral Q1200 Adam Phenix, MD      . traMADol Janean Sark) tablet 50 mg  50 mg Oral Q6H PRN Adam Phenix, MD   50 mg at 03/15/17 0211    Labs:  CBC Latest Ref Rng & Units 03/16/2017 03/15/2017 03/15/2017  WBC 4.0 - 10.5 K/uL 7.8 6.9 6.1  Hemoglobin 12.0 - 15.0 g/dL 7.5(L) 7.7(L) 7.5(L)   Hematocrit 36.0 - 46.0 % 21.6(L) 22.2(L) 21.0(L)  Platelets 150 - 400 K/uL 74(L) 76(L) 78(L)   CMP Latest Ref Rng & Units 03/16/2017 03/15/2017 03/15/2017  Glucose 65 - 99 mg/dL 80 77 77  BUN 6 - 20 mg/dL 18 69(G) 29(B)  Creatinine 0.44 - 1.00 mg/dL 2.84(X) 3.24(M) 0.10(U)  Sodium 135 - 145 mmol/L 135 134(L) 135  Potassium 3.5 - 5.1 mmol/L 3.1(L) 3.2(L) 3.0(L)  Chloride 101 - 111 mmol/L 110 111 111  CO2 22 - 32 mmol/L 18(L) 15(L) 16(L)  Calcium 8.9 - 10.3 mg/dL 8.0(L) 7.5(L) 7.4(L)  Total Protein 6.5 - 8.1 g/dL - 5.1(L) -  Total Bilirubin 0.3 - 1.2 mg/dL - 0.6 -  Alkaline Phos 38 - 126 U/L - 67 -  AST 15 - 41 U/L - 14(L) -  ALT 14 - 54 U/L - 10(L) -     Radiology: no new imaging  Assessment & Plan:  Pt stable *Pregnancy: 150 baseline, +accels, no decel, mod variability. toco quiet  -no issues currently will do nst qday.  *ID: ID following. Continue rocephin. BCx Sx still pending. Will come back later to do full physical exam.  *CM on CXR: maternal echo ordered for today *Pulm: see above. [place] added to keep o2 sats above 94. Was able to wean to around 94 on RA last night occasionally.  *Heme: f/u am labs. plts stable. Normal plts prior to admission so likely due to acute process. F/u echo and may transfuse if echo is negative as may help with pulmonary status.  *AKI: see above. Improving cr and good UOP.  *Preterm: no issues currently *PPx: scds *FEN/GI: regular diet. Saline lock IV. Kdur ordered *Dispo: pending clinical course.   Cornelia Copa MD Attending Center for [facility])

## 2017-03-17 ENCOUNTER — Encounter: Payer: Medicaid Other | Admitting: Advanced Practice Midwife

## 2017-03-17 DIAGNOSIS — O26892 Other specified pregnancy related conditions, second trimester: Secondary | ICD-10-CM

## 2017-03-17 DIAGNOSIS — N12 Tubulo-interstitial nephritis, not specified as acute or chronic: Secondary | ICD-10-CM

## 2017-03-17 DIAGNOSIS — R8271 Bacteriuria: Secondary | ICD-10-CM

## 2017-03-17 DIAGNOSIS — O2302 Infections of kidney in pregnancy, second trimester: Principal | ICD-10-CM

## 2017-03-17 DIAGNOSIS — D649 Anemia, unspecified: Secondary | ICD-10-CM

## 2017-03-17 DIAGNOSIS — O99012 Anemia complicating pregnancy, second trimester: Secondary | ICD-10-CM

## 2017-03-17 LAB — TYPE AND SCREEN
ABO/RH(D): A NEG
Antibody Screen: NEGATIVE
UNIT DIVISION: 0
Unit division: 0

## 2017-03-17 LAB — BPAM RBC
BLOOD PRODUCT EXPIRATION DATE: 201806192359
Blood Product Expiration Date: 201806192359
ISSUE DATE / TIME: 201806051125
ISSUE DATE / TIME: 201806051358
UNIT TYPE AND RH: 600
Unit Type and Rh: 600

## 2017-03-17 LAB — CULTURE, BLOOD (ROUTINE X 2): Special Requests: ADEQUATE

## 2017-03-17 NOTE — Progress Notes (Signed)
Patient ID: April Young, female   DOB: 1996-05-10, 21 y.o.   MRN: [id number]  Patient ID: April Young, female   DOB: 1996/08/31, 21 y.o.   MRN: [id number]          [facility]  Date of Admission:  03/13/2017           Day 4 ceftriaxone  Active Problems:   Pyelonephritis   Bacteremia due to Escherichia coli   Anemia affecting pregnancy in second trimester   Gestational thrombocytopenia (HCC)   . potassium chloride  30 mEq Oral TID AC  . prenatal multivitamin  1 tablet Oral Q1200    SUBJECTIVE: She is feeling much better. She is no longer having any flank pain, dysuria, chills, nausea or vomiting. She is having only minimal dry cough.  Review of Systems: Review of Systems  Constitutional: Negative for chills, diaphoresis and fever.  Respiratory: Positive for cough. Negative for sputum production and shortness of breath.   Cardiovascular: Negative for chest pain.  Gastrointestinal: Negative for abdominal pain, diarrhea, nausea and vomiting.  Genitourinary: Negative for dysuria, frequency and urgency.    Past Medical History:  Diagnosis Date  . Abdominal pain affecting pregnancy 03/10/2017  . Candida vaginitis 03/10/2017  . Hx of trichomoniasis 11/2016  . Rh negative status during pregnancy   . Urinary tract infection 03/10/2017    Social History  Substance Use Topics  . Smoking status: Never Smoker  . Smokeless tobacco: Never Used  . Alcohol use No    Family History  Problem Relation Age of Onset  . COPD Maternal Grandmother    Allergies  Allergen Reactions  . Penicillins Rash and Other (See Comments)    03/2017: took rocephin w/o issue. Has patient had a PCN reaction causing immediate rash, facial/tongue/throat swelling, SOB or lightheadedness with hypotension: No Has patient had a PCN reaction causing severe rash involving mucus membranes or skin necrosis: No Has patient had a PCN reaction that required hospitalization:  No Has patient had a PCN reaction occurring within the last 10 years: No If all of the above answers are "NO", then may proceed with Cephalosporin use.    OBJECTIVE: Vitals:   03/16/17 1556 03/16/17 1722 03/16/17 2037 03/17/17 0741  BP:  100/66 103/60 (!) 92/54  Pulse: 93 87 94 78  Resp:  18 18 20   Temp:  97.4 F (36.3 C) 98.6 F (37 C) 97.6 F (36.4 C)  TempSrc:  Oral Oral Oral  SpO2: 99% 97% 99% 98%  Weight:      Height:       Body mass index is 17.57 kg/m.  Physical Exam  Constitutional: She is oriented to person, place, and time.  She is smiling and in better spirits this morning.  Cardiovascular: Normal rate and regular rhythm.   No murmur heard. Pulmonary/Chest: Effort normal and breath sounds normal. She has no wheezes. She has no rales.  Abdominal: Soft. There is no tenderness.  Neurological: She is alert and oriented to person, place, and time.  Psychiatric: Mood and affect normal.    Lab Results Lab Results  Component Value Date   WBC 9.2 03/16/2017   HGB 10.3 (L) 03/16/2017   HCT 29.6 (L) 03/16/2017   MCV 80.7 03/16/2017   PLT 76 (L) 03/16/2017    Lab Results  Component Value Date   CREATININE 1.67 (H) 03/16/2017   BUN 18 03/16/2017   NA 135 03/16/2017   K 3.1 (L) 03/16/2017   CL  110 03/16/2017   CO2 18 (L) 03/16/2017    Lab Results  Component Value Date   ALT 10 (L) 03/15/2017   AST 14 (L) 03/15/2017   ALKPHOS 67 03/15/2017   BILITOT 0.6 03/15/2017     Microbiology: Recent Results (from the past 240 hour(s))  Urine culture     Status: Abnormal   Collection Time: 03/10/17 10:21 AM  Result Value Ref Range Status   Specimen Description URINE, CLEAN CATCH  Final   Special Requests Normal  Final   Culture >=100,000 COLONIES/mL ESCHERICHIA COLI (A)  Final   Report Status 03/12/2017 FINAL  Final   Organism ID, Bacteria ESCHERICHIA COLI (A)  Final      Susceptibility   Escherichia coli - MIC*    AMPICILLIN >=32 RESISTANT Resistant      CEFAZOLIN <=4 SENSITIVE Sensitive     CEFTRIAXONE <=1 SENSITIVE Sensitive     CIPROFLOXACIN <=0.25 SENSITIVE Sensitive     GENTAMICIN <=1 SENSITIVE Sensitive     IMIPENEM <=0.25 SENSITIVE Sensitive     NITROFURANTOIN <=16 SENSITIVE Sensitive     TRIMETH/SULFA <=20 SENSITIVE Sensitive     AMPICILLIN/SULBACTAM 16 INTERMEDIATE Intermediate     PIP/TAZO <=4 SENSITIVE Sensitive     Extended ESBL NEGATIVE Sensitive     * >=100,000 COLONIES/mL ESCHERICHIA COLI  Wet prep, genital     Status: Abnormal   Collection Time: 03/10/17 10:35 AM  Result Value Ref Range Status   Yeast Wet Prep HPF POC PRESENT (A) NONE SEEN Final   Trich, Wet Prep NONE SEEN NONE SEEN Final   Clue Cells Wet Prep HPF POC NONE SEEN NONE SEEN Final   WBC, Wet Prep HPF POC MANY (A) NONE SEEN Final    Comment: MANY BACTERIA SEEN   Sperm NONE SEEN  Final  Culture, blood (Routine X 2) w Reflex to ID Panel     Status: Abnormal   Collection Time: 03/13/17  8:47 PM  Result Value Ref Range Status   Specimen Description BLOOD LEFT ARM  Final   Special Requests   Final    BOTTLES DRAWN AEROBIC AND ANAEROBIC Blood Culture adequate volume   Culture  Setup Time   Final    GRAM NEGATIVE RODS IN BOTH AEROBIC AND ANAEROBIC BOTTLES CRITICAL RESULT CALLED TO, READ BACK BY AND VERIFIED WITH: J MENDENHALL,PHARMD AT 1320 03/14/17 BY L BENFIELD Performed at [facility], [address] [place], [place] [address]    Culture ESCHERICHIA COLI (A)  Final   Report Status 03/16/2017 FINAL  Final   Organism ID, Bacteria ESCHERICHIA COLI  Final      Susceptibility   Escherichia coli - MIC*    AMPICILLIN >=32 RESISTANT Resistant     CEFAZOLIN <=4 SENSITIVE Sensitive     CEFEPIME <=1 SENSITIVE Sensitive     CEFTAZIDIME <=1 SENSITIVE Sensitive     CEFTRIAXONE <=1 SENSITIVE Sensitive     CIPROFLOXACIN <=0.25 SENSITIVE Sensitive     GENTAMICIN <=1 SENSITIVE Sensitive     IMIPENEM <=0.25 SENSITIVE Sensitive     TRIMETH/SULFA <=20 SENSITIVE  Sensitive     AMPICILLIN/SULBACTAM 16 INTERMEDIATE Intermediate     PIP/TAZO <=4 SENSITIVE Sensitive     Extended ESBL NEGATIVE Sensitive     * ESCHERICHIA COLI  Blood Culture ID Panel (Reflexed)     Status: Abnormal   Collection Time: 03/13/17  8:47 PM  Result Value Ref Range Status   Enterococcus species NOT DETECTED NOT DETECTED Final   Listeria  monocytogenes NOT DETECTED NOT DETECTED Final   Staphylococcus species NOT DETECTED NOT DETECTED Final   Staphylococcus aureus NOT DETECTED NOT DETECTED Final   Streptococcus species NOT DETECTED NOT DETECTED Final   Streptococcus agalactiae NOT DETECTED NOT DETECTED Final   Streptococcus pneumoniae NOT DETECTED NOT DETECTED Final   Streptococcus pyogenes NOT DETECTED NOT DETECTED Final   Acinetobacter baumannii NOT DETECTED NOT DETECTED Final   Enterobacteriaceae species DETECTED (A) NOT DETECTED Final    Comment: Enterobacteriaceae represent a large family of gram-negative bacteria, not a single organism. CRITICAL RESULT CALLED TO, READ BACK BY AND VERIFIED WITH: J MENDENHALL,PHARMD AT 1320 03/14/17 BY L BENFIELD    Enterobacter cloacae complex NOT DETECTED NOT DETECTED Final   Escherichia coli DETECTED (A) NOT DETECTED Final    Comment: CRITICAL RESULT CALLED TO, READ BACK BY AND VERIFIED WITH: J MENDENHALL,PHARMD AT 1320 03/14/17 BY L BENFIELD    Klebsiella oxytoca NOT DETECTED NOT DETECTED Final   Klebsiella pneumoniae NOT DETECTED NOT DETECTED Final   Proteus species NOT DETECTED NOT DETECTED Final   Serratia marcescens NOT DETECTED NOT DETECTED Final   Carbapenem resistance NOT DETECTED NOT DETECTED Final   Haemophilus influenzae NOT DETECTED NOT DETECTED Final   Neisseria meningitidis NOT DETECTED NOT DETECTED Final   Pseudomonas aeruginosa NOT DETECTED NOT DETECTED Final   Candida albicans NOT DETECTED NOT DETECTED Final   Candida glabrata NOT DETECTED NOT DETECTED Final   Candida krusei NOT DETECTED NOT DETECTED Final   Candida  parapsilosis NOT DETECTED NOT DETECTED Final   Candida tropicalis NOT DETECTED NOT DETECTED Final    Comment: Performed at [facility], [address] [place], [place] [address]  Culture, blood (Routine X 2) w Reflex to ID Panel     Status: Abnormal   Collection Time: 03/13/17  8:55 PM  Result Value Ref Range Status   Specimen Description BLOOD LEFT HAND  Final   Special Requests   Final    BOTTLES DRAWN AEROBIC ONLY Blood Culture adequate volume   Culture  Setup Time   Final    GRAM NEGATIVE RODS AEROBIC BOTTLE ONLY CRITICAL VALUE NOTED.  VALUE IS CONSISTENT WITH PREVIOUSLY REPORTED AND CALLED VALUE.    Culture (A)  Final    ESCHERICHIA COLI SUSCEPTIBILITIES PERFORMED ON PREVIOUS CULTURE WITHIN THE LAST 5 DAYS. Performed at [facility], [address] [place], [place] [address]    Report Status 03/17/2017 FINAL  Final     ASSESSMENT: She continues to improve on therapy for Escherichia coli pyelonephritis and transient bacteremia. I recommend discharge home tomorrow on oral cephalexin 500 mg by mouth 3 times daily to complete a total of 2 weeks of antibiotic therapy. 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 This makes it very different to select affective prophylactic antibiotics for the remainder of her pregnancy. I favor obtaining a urine culture 1 week after completing cephalexin as a test of cure. I would then obtain repeat cultures monthly throughout her pregnancy.  PLAN: 1. Change to cephalexin 500 mg 3 times a day tomorrow and continue 9 days through 03/27/2017 2. I will sign off now but please call if I can be of further assistance  Cliffton Asters, MD [facility] [phone] pager   [phone] cell 03/17/2017, 10:56 AM

## 2017-03-17 NOTE — Lactation Note (Addendum)
Lactation Consultation Note  Patient Name: Cecelia ByarsCierra N Baumert [id number]'JToday's Date: 03/17/2017   Patient's bedside nurse, Lindie SpruceMeghan, RN requested [facility]. Mom's nipples/areolas appear swollen/enlarged and mom's right breast feels firm on the outer aspect at the base of the breast--where the breast meets mom's chest. Mom reports that this has been the condition of her right breast and both areola for the past couple of months. Mom denies any pain, or any discharge from nipples.   Discussed with mom that pregnancy hormones do cause breast and areolar changes. Patient's bedside nurse, Lindie SpruceMeghan, RN to call faculty OB for further assessment.   Maternal Data    Feeding    LATCH Score/Interventions                      Lactation Tools Discussed/Used     Consult Status      Sherlyn HayJennifer D Lakyla Biswas 03/17/2017, 2:31 PM

## 2017-03-17 NOTE — Progress Notes (Signed)
Patient ID: April Young, female   DOB: Sep 16, 1996, 21 y.o.   MRN: [id number]  FACULTY PRACTICE ANTEPARTUM NOTE  April Young is a 21 y.o. G1P0000 at 5345w2d  who is admitted for pyelonephritis.   Fetal presentation is unsure. Length of Stay:  4  Days  Subjective: Patient improved. Good appetite. No fevers or chills overnight (last fever 6/5 0147). Breathing improved - no shortness of breath, chest pain or cough. Back pain improved. Patient reports good fetal movement.   She reports no uterine contractions She reports no bleeding  She reports no loss of fluid per vagina.  Vitals:  Blood pressure 103/60, pulse 94, temperature 98.6 F (37 C), temperature source Oral, resp. rate 18, height 5\' 9"  (1.753 m), weight 119 lb (54 kg), last menstrual period 09/28/2016, SpO2 99 %. Physical Examination:  General appearance - alert, well appearing, and in no distress Mental status - alert, oriented to person, place, and time Chest - clear to auscultation, no wheezes, rales or rhonchi, symmetric air entry Heart - normal rate, regular rhythm, normal S1, S2, no murmurs, rubs, clicks or gallops Abdomen - soft, nontender, nondistended, no masses or organomegaly Fundal Height:  size equals dates Extremities: extremities normal, atraumatic, no cyanosis or edema and Homans sign is negative, no sign of DVT  Membranes:intact  Fetal Monitoring:  Baseline: 125 bpm, Variability: Good {> 6 bpm), Accelerations: Reactive and Decelerations: Absent  Labs:  Results for orders placed or performed during the hospital encounter of 03/13/17 (from the past 24 hour(s))  Prepare RBC   Collection Time: 03/16/17  9:00 AM  Result Value Ref Range   Order Confirmation ORDER PROCESSED BY BLOOD BANK   ECHOCARDIOGRAM COMPLETE   Collection Time: 03/16/17  9:19 AM  Result Value Ref Range   Weight 1,904 oz   Height 69 in   BP 94/38 mmHg  CBC   Collection Time: 03/16/17  7:54 PM  Result Value Ref Range   WBC 9.2 4.0  - 10.5 K/uL   RBC 3.67 (L) 3.87 - 5.11 MIL/uL   Hemoglobin 10.3 (L) 12.0 - 15.0 g/dL   HCT 91.429.6 (L) 78.236.0 - 95.646.0 %   MCV 80.7 78.0 - 100.0 fL   MCH 28.1 26.0 - 34.0 pg   MCHC 34.8 30.0 - 36.0 g/dL   RDW 21.314.8 08.611.5 - 57.815.5 %   Platelets 76 (L) 150 - 400 K/uL  Type and screen [facility]   Collection Time: 03/16/17  7:54 PM  Result Value Ref Range   ABO/RH(D) A NEG    Antibody Screen NEG    Sample Expiration 03/19/2017     Imaging Studies:     Echo: normal EF, no diastolic dysfunction.   Medications:  Scheduled . potassium chloride  30 mEq Oral TID AC  . prenatal multivitamin  1 tablet Oral Q1200   I have reviewed the patient's current medications.  ASSESSMENT: Active Problems:   Pyelonephritis   Anemia affecting pregnancy in second trimester   Bacteremia due to Escherichia coli   Gestational thrombocytopenia (HCC)   PLAN: 1. Pyelonephritis 2. Bacteremia due to E Coli  Sensitive to Ceftriaxone - will continue until afebrile 48 hours, then d/c if stable.   Discussed prophylaxis treatment  NST reactive 3. Anemia  S/p 2 units PRBCs, CBC post transfusion 10.3  Continue routine antenatal care.   Levie HeritageStinson, Debara Kamphuis J, DO 03/17/2017,7:28 AM

## 2017-03-18 DIAGNOSIS — O2302 Infections of kidney in pregnancy, second trimester: Secondary | ICD-10-CM

## 2017-03-18 DIAGNOSIS — N12 Tubulo-interstitial nephritis, not specified as acute or chronic: Secondary | ICD-10-CM

## 2017-03-18 LAB — COMPREHENSIVE METABOLIC PANEL
ALT: 11 U/L — ABNORMAL LOW (ref 14–54)
AST: 19 U/L (ref 15–41)
Albumin: 2 g/dL — ABNORMAL LOW (ref 3.5–5.0)
Alkaline Phosphatase: 77 U/L (ref 38–126)
Anion gap: 8 (ref 5–15)
BUN: 16 mg/dL (ref 6–20)
CHLORIDE: 110 mmol/L (ref 101–111)
CO2: 21 mmol/L — AB (ref 22–32)
Calcium: 8.1 mg/dL — ABNORMAL LOW (ref 8.9–10.3)
Creatinine, Ser: 1.11 mg/dL — ABNORMAL HIGH (ref 0.44–1.00)
GFR calc Af Amer: >60 mL/min (ref >60)
Glucose, Bld: 89 mg/dL (ref 65–99)
POTASSIUM: 3.1 mmol/L — AB (ref 3.5–5.1)
SODIUM: 139 mmol/L (ref 135–145)
Total Bilirubin: 0.4 mg/dL (ref 0.3–1.2)
Total Protein: 5.2 g/dL — ABNORMAL LOW (ref 6.5–8.1)

## 2017-03-18 LAB — CBC
HCT: 28.8 % — ABNORMAL LOW (ref 36.0–46.0)
Hemoglobin: 10.1 g/dL — ABNORMAL LOW (ref 12.0–15.0)
MCH: 28.5 pg (ref 26.0–34.0)
MCHC: 35.1 g/dL (ref 30.0–36.0)
MCV: 81.1 fL (ref 78.0–100.0)
PLATELETS: 73 10*3/uL — AB (ref 150–400)
RBC: 3.55 MIL/uL — ABNORMAL LOW (ref 3.87–5.11)
RDW: 15.3 % (ref 11.5–15.5)
WBC: 9.4 10*3/uL (ref 4.0–10.5)

## 2017-03-18 MED ORDER — CEPHALEXIN 500 MG PO CAPS
500.0000 mg | ORAL_CAPSULE | Freq: Four times a day (QID) | ORAL | Status: DC
  Administered 2017-03-18 – 2017-03-19 (×2): 500 mg via ORAL
  Filled 2017-03-18 (×7): qty 1

## 2017-03-18 NOTE — Progress Notes (Signed)
Encouraged patient to drink water and ambulate in the hall

## 2017-03-18 NOTE — Progress Notes (Signed)
Patient ID: April ByarsCierra N Young, female   DOB: [birth year], 21 y.o.   MRN: [id number]  FACULTY PRACTICE ANTEPARTUM NOTE  April Young is a 21 y.o. G1P0000 at 3359w3d   who is admitted for pyelonephritis.   Fetal presentation is unsure. Length of Stay:  5  Days  Subjective: Patient improved. Good appetite. No fevers or chills overnight (last fever 6/5 0147). Breathing improved - no shortness of breath, chest pain or cough. Back pain improved. Patient reports good fetal movement.   She reports no uterine contractions She reports no bleeding  She reports no loss of fluid per vagina.  Vitals:  Blood pressure (!) 90/48, pulse 87, temperature 98.1 F (36.7 C), temperature source Oral, resp. rate 18, height 5\' 9"  (1.753 m), weight 119 lb (54 kg), last menstrual period 09/28/2016, SpO2 94 %. Physical Examination:  General appearance - alert, well appearing, and in no distress Mental status - alert, oriented to person, place, and time Chest - clear to auscultation, no wheezes, rales or rhonchi, symmetric air entry Heart - normal rate, regular rhythm, normal S1, S2, no murmurs, rubs, clicks or gallops Abdomen - soft, nontender, nondistended, no masses or organomegaly Fundal Height:  size equals dates Extremities: extremities normal, atraumatic, no cyanosis or edema and Homans sign is negative, no sign of DVT  Membranes:intact  Fetal Monitoring:  Baseline: 125 bpm, Variability: Good {> 6 bpm), Accelerations: Reactive and Decelerations: Absent  Labs:  Results for orders placed or performed during the hospital encounter of 03/13/17 (from the past 24 hour(s))  CBC   Collection Time: 03/18/17  5:33 AM  Result Value Ref Range   WBC 9.4 4.0 - 10.5 K/uL   RBC 3.55 (L) 3.87 - 5.11 MIL/uL   Hemoglobin 10.1 (L) 12.0 - 15.0 g/dL   HCT 40.928.8 (L) 81.136.0 - 91.446.0 %   MCV 81.1 78.0 - 100.0 fL   MCH 28.5 26.0 - 34.0 pg   MCHC 35.1 30.0 - 36.0 g/dL   RDW 78.215.3 95.611.5 - 21.315.5 %   Platelets 73 (L) 150 - 400 K/uL   Comprehensive metabolic panel   Collection Time: 03/18/17  5:33 AM  Result Value Ref Range   Sodium 139 135 - 145 mmol/L   Potassium 3.1 (L) 3.5 - 5.1 mmol/L   Chloride 110 101 - 111 mmol/L   CO2 21 (L) 22 - 32 mmol/L   Glucose, Bld 89 65 - 99 mg/dL   BUN 16 6 - 20 mg/dL   Creatinine, Ser 0.861.11 (H) 0.44 - 1.00 mg/dL   Calcium 8.1 (L) 8.9 - 10.3 mg/dL   Total Protein 5.2 (L) 6.5 - 8.1 g/dL   Albumin 2.0 (L) 3.5 - 5.0 g/dL   AST 19 15 - 41 U/L   ALT 11 (L) 14 - 54 U/L   Alkaline Phosphatase 77 38 - 126 U/L   Total Bilirubin 0.4 0.3 - 1.2 mg/dL   GFR calc non Af Amer >60 >60 mL/min   GFR calc Af Amer >60 >60 mL/min   Anion gap 8 5 - 15    Imaging Studies:     Echo: normal EF, no diastolic dysfunction.   Medications:  Scheduled . prenatal multivitamin  1 tablet Oral Q1200   I have reviewed the patient's current medications.  ASSESSMENT: Active Problems:   Pyelonephritis   Anemia affecting pregnancy in second trimester   Bacteremia due to Escherichia coli   Gestational thrombocytopenia (HCC)   PLAN: 1. Pyelonephritis 2. Bacteremia due to E Coli  Sensitive to Ceftriaxone - will continue until afebrile 48 hours, then d/c if stable. Probable discharge tomorrow if afebrile, home on keflex QID  Discussed prophylaxis treatment  NST reactive 3. Anemia  S/p 2 units PRBCs, CBC post transfusion 10.3  Continue routine antenatal care.   Lazaro Arms, MD 03/18/2017,9:21 AMPatient ID: April Young, female   DOB: 16-Oct-1995, 21 y.o.   MRN: [id number]

## 2017-03-19 DIAGNOSIS — O2342 Unspecified infection of urinary tract in pregnancy, second trimester: Secondary | ICD-10-CM

## 2017-03-19 MED ORDER — CEPHALEXIN 500 MG PO CAPS: 500.0000 mg | ORAL_CAPSULE | Freq: Four times a day (QID) | ORAL | 0 refills | Status: DC

## 2017-03-19 NOTE — Progress Notes (Signed)
Dr. Shawnie PonsPratt called with report that IV occluded and pt still on IV abx. Orders to d/c saline lock and start po Keflex

## 2017-03-19 NOTE — Discharge Summary (Signed)
Antenatal Physician Discharge Summary  Patient ID: April Young MRN: [id number] DOB/AGE: [birth year] 21 y.o.  Admit date: 03/13/2017 Discharge date: 03/19/2017  Admission Diagnoses: Active Problems:   Pyelonephritis   Anemia affecting pregnancy in second trimester   Bacteremia due to Escherichia coli   Gestational thrombocytopenia [facility])    Discharge Diagnoses: Same  Prenatal Procedures: NST and ultrasound  Consults: Infectious disease  Hospital Course:  This is a 21 y.o. G1P0000 with IUP at [redacted]w[redacted]d admitted for Pyelonephritis. Please see H & P for full details. Had positive urine culture for E.coli. She had fever (102.8), dirty urine and elevated WBC (12.0). She was started on Rocephin. She then became increasingly SOB, with falling BPs and acute renal injury consistent with sepsis including an elevated lactic acid. Sepsis protocol was started. She was placed on CPAP, had blood cultures done (which were positive for E.coli). She was maintained on IV Rocephin. The patient slowly improved. Renal imaging did not show evidence of stone. Her breathing and kidney function improved. ID consulted for + blood cultures and recommendations followed. She then defervesed, had no further CVA tenderness, and was no longer vomiting. Her IV infiltrated and she was transitioned to PO Abx, and remained afebrile x 60 hours prior to discharge.  She was deemed stable for discharge to home with outpatient follow up.  Discharge Exam: Temp:  [97.3 F (36.3 C)-98.5 F (36.9 C)] 97.3 F (36.3 C) (06/08 0346) Pulse Rate:  [68-92] 73 (06/08 0346) Resp:  [16-18] 16 (06/08 0346) BP: (99-112)/(54-69) 99/54 (06/08 0346) SpO2:  [97 %-100 %] 97 % (06/08 0346) Physical Examination: CONSTITUTIONAL: Well-developed, well-nourished female in no acute distress.  HENT:  Normocephalic, atraumatic, External right and left ear normal. Oropharynx is clear and moist EYES: Conjunctivae and EOM are normal. No scleral icterus.   NECK: Normal range of motion, supple, no masses SKIN: Skin is warm and dry. No rash noted. Not diaphoretic. No erythema. No pallor. NEUROLGIC: Alert and oriented to person, place, and time. PSYCHIATRIC: Normal mood and affect.  CARDIOVASCULAR: Normal heart rate noted, regular rhythm RESPIRATORY: Effort and breath sounds normal, no problems with respiration noted MUSCULOSKELETAL: Normal range of motion. No edema and no tenderness. 2+ distal pulses. ABDOMEN: Soft, nontender, nondistended, gravid.  Fetal monitoring: FHR: 140 bpm, Variability: moderate, Accelerations: Present, Decelerations: Absent   Significant Diagnostic Studies:  Results for orders placed or performed during the hospital encounter of 03/13/17 (from the past 168 hour(s))  Urinalysis, Routine w reflex microscopic   Collection Time: 03/13/17  8:30 PM  Result Value Ref Range   Color, Urine AMBER (A) YELLOW   APPearance TURBID (A) CLEAR   Specific Gravity, Urine 1.013 1.005 - 1.030   pH 5.0 5.0 - 8.0   Glucose, UA NEGATIVE NEGATIVE mg/dL   Hgb urine dipstick SMALL (A) NEGATIVE   Bilirubin Urine NEGATIVE NEGATIVE   Ketones, ur 5 (A) NEGATIVE mg/dL   Protein, ur 782 (A) NEGATIVE mg/dL   Nitrite NEGATIVE NEGATIVE   Leukocytes, UA LARGE (A) NEGATIVE   RBC / HPF 6-30 0 - 5 RBC/hpf   WBC, UA TOO NUMEROUS TO COUNT 0 - 5 WBC/hpf   Bacteria, UA MANY (A) NONE SEEN   Squamous Epithelial / LPF 0-5 (A) NONE SEEN   WBC Clumps PRESENT    Mucous PRESENT   Culture, blood (Routine X 2) w Reflex to ID Panel   Collection Time: 03/13/17  8:47 PM  Result Value Ref Range   Specimen Description BLOOD LEFT ARM  Special Requests      BOTTLES DRAWN AEROBIC AND ANAEROBIC Blood Culture adequate volume   Culture  Setup Time      GRAM NEGATIVE RODS IN BOTH AEROBIC AND ANAEROBIC BOTTLES CRITICAL RESULT CALLED TO, READ BACK BY AND VERIFIED WITH: J MENDENHALL,PHARMD AT 1320 03/14/17 BY L BENFIELD Performed at [facility], [address], [place], [place] [address]    Culture ESCHERICHIA COLI (A)    Report Status 03/16/2017 FINAL    Organism ID, Bacteria ESCHERICHIA COLI       Susceptibility   Escherichia coli - MIC*    AMPICILLIN >=32 RESISTANT Resistant     CEFAZOLIN <=4 SENSITIVE Sensitive     CEFEPIME <=1 SENSITIVE Sensitive     CEFTAZIDIME <=1 SENSITIVE Sensitive     CEFTRIAXONE <=1 SENSITIVE Sensitive     CIPROFLOXACIN <=0.25 SENSITIVE Sensitive     GENTAMICIN <=1 SENSITIVE Sensitive     IMIPENEM <=0.25 SENSITIVE Sensitive     TRIMETH/SULFA <=20 SENSITIVE Sensitive     AMPICILLIN/SULBACTAM 16 INTERMEDIATE Intermediate     PIP/TAZO <=4 SENSITIVE Sensitive     Extended ESBL NEGATIVE Sensitive     * ESCHERICHIA COLI  Blood Culture ID Panel (Reflexed)   Collection Time: 03/13/17  8:47 PM  Result Value Ref Range   Enterococcus species NOT DETECTED NOT DETECTED   Listeria monocytogenes NOT DETECTED NOT DETECTED   Staphylococcus species NOT DETECTED NOT DETECTED   Staphylococcus aureus NOT DETECTED NOT DETECTED   Streptococcus species NOT DETECTED NOT DETECTED   Streptococcus agalactiae NOT DETECTED NOT DETECTED   Streptococcus pneumoniae NOT DETECTED NOT DETECTED   Streptococcus pyogenes NOT DETECTED NOT DETECTED   Acinetobacter baumannii NOT DETECTED NOT DETECTED   Enterobacteriaceae species DETECTED (A) NOT DETECTED   Enterobacter cloacae complex NOT DETECTED NOT DETECTED   Escherichia coli DETECTED (A) NOT DETECTED   Klebsiella oxytoca NOT DETECTED NOT DETECTED   Klebsiella pneumoniae NOT DETECTED NOT DETECTED   Proteus species NOT DETECTED NOT DETECTED   Serratia marcescens NOT DETECTED NOT DETECTED   Carbapenem resistance NOT DETECTED NOT DETECTED   Haemophilus influenzae NOT DETECTED NOT DETECTED   Neisseria meningitidis NOT DETECTED NOT DETECTED   Pseudomonas aeruginosa NOT DETECTED NOT DETECTED   Candida albicans NOT DETECTED NOT DETECTED   Candida glabrata NOT DETECTED NOT DETECTED   Candida  krusei NOT DETECTED NOT DETECTED   Candida parapsilosis NOT DETECTED NOT DETECTED   Candida tropicalis NOT DETECTED NOT DETECTED  CBC with Differential/Platelet   Collection Time: 03/13/17  8:47 PM  Result Value Ref Range   WBC 12.0 (H) 4.0 - 10.5 K/uL   RBC 3.20 (L) 3.87 - 5.11 MIL/uL   Hemoglobin 9.1 (L) 12.0 - 15.0 g/dL   HCT 40.9 (L) 81.1 - 91.4 %   MCV 81.6 78.0 - 100.0 fL   MCH 28.4 26.0 - 34.0 pg   MCHC 34.9 30.0 - 36.0 g/dL   RDW 78.2 95.6 - 21.3 %   Platelets 105 (L) 150 - 400 K/uL   Neutrophils Relative % 91 %   Lymphocytes Relative 6 %   Monocytes Relative 3 %   Eosinophils Relative 0 %   Basophils Relative 0 %   Band Neutrophils 0 %   Metamyelocytes Relative 0 %   Myelocytes 0 %   Promyelocytes Absolute 0 %   Blasts 0 %   nRBC 0 0 /100 WBC   Other 0 %   Neutro Abs 10.9 (H) 1.7 - 7.7  K/uL   Lymphs Abs 0.7 0.7 - 4.0 K/uL   Monocytes Absolute 0.4 0.1 - 1.0 K/uL   Eosinophils Absolute 0.0 0.0 - 0.7 K/uL   Basophils Absolute 0.0 0.0 - 0.1 K/uL  Lactic acid, plasma   Collection Time: 03/13/17  8:47 PM  Result Value Ref Range   Lactic Acid, Venous 1.6 0.5 - 1.9 mmol/L  Culture, blood (Routine X 2) w Reflex to ID Panel   Collection Time: 03/13/17  8:55 PM  Result Value Ref Range   Specimen Description BLOOD LEFT HAND    Special Requests      BOTTLES DRAWN AEROBIC ONLY Blood Culture adequate volume   Culture  Setup Time      GRAM NEGATIVE RODS AEROBIC BOTTLE ONLY CRITICAL VALUE NOTED.  VALUE IS CONSISTENT WITH PREVIOUSLY REPORTED AND CALLED VALUE.    Culture (A)     ESCHERICHIA COLI SUSCEPTIBILITIES PERFORMED ON PREVIOUS CULTURE WITHIN THE LAST 5 DAYS. Performed at [facility], [address], [place], [place] [address]    Report Status 03/17/2017 FINAL   Type and screen [facility] [place]   Collection Time: 03/13/17  9:15 PM  Result Value Ref Range   ABO/RH(D) A NEG    Antibody Screen NEG    Sample Expiration 03/16/2017    Unit Number  [id number]    Blood Component Type RED CELLS,LR    Unit division 00    Status of Unit ISSUED,FINAL    Transfusion Status OK TO TRANSFUSE    Crossmatch Result Compatible    Unit Number [id number]    Blood Component Type RED CELLS,LR    Unit division 00    Status of Unit ISSUED,FINAL    Transfusion Status OK TO TRANSFUSE    Crossmatch Result Compatible   ABO/Rh   Collection Time: 03/13/17  9:15 PM  Result Value Ref Range   ABO/RH(D) A NEG   BPAM RBC   Collection Time: 03/13/17  9:15 PM  Result Value Ref Range   ISSUE DATE / TIME [id number]    Blood Product Unit Number [id number]    PRODUCT CODE E0336V00    Unit Type and Rh 0600    Blood Product Expiration Date 664403474259    ISSUE DATE / TIME [id number]    Blood Product Unit Number [id number]    PRODUCT CODE S0109N23    Unit Type and Rh 0600    Blood Product Expiration Date [id number]   CBC with Differential   Collection Time: 03/14/17  8:06 AM  Result Value Ref Range   WBC 6.9 4.0 - 10.5 K/uL   RBC 2.71 (L) 3.87 - 5.11 MIL/uL   Hemoglobin 7.6 (L) 12.0 - 15.0 g/dL   HCT 06.2 (L) 37.6 - 28.3 %   MCV 80.4 78.0 - 100.0 fL   MCH 28.0 26.0 - 34.0 pg   MCHC 34.9 30.0 - 36.0 g/dL   RDW 15.1 76.1 - 60.7 %   Platelets 74 (L) 150 - 400 K/uL   Neutrophils Relative % 90 %   Neutro Abs 6.2 1.7 - 7.7 K/uL   Lymphocytes Relative 9 %   Lymphs Abs 0.7 0.7 - 4.0 K/uL   Monocytes Relative 1 %   Monocytes Absolute 0.1 0.1 - 1.0 K/uL   Eosinophils Relative 0 %   Eosinophils Absolute 0.0 0.0 - 0.7 K/uL   Basophils Relative 0 %   Basophils Absolute 0.0 0.0 - 0.1 K/uL  Comprehensive metabolic panel   Collection Time: 03/14/17  8:06  AM  Result Value Ref Range   Sodium 132 (L) 135 - 145 mmol/L   Potassium 2.7 (LL) 3.5 - 5.1 mmol/L   Chloride 105 101 - 111 mmol/L   CO2 17 (L) 22 - 32 mmol/L   Glucose, Bld 150 (H) 65 - 99 mg/dL   BUN 39 (H) 6 - 20 mg/dL   Creatinine, Ser 1.61 (H) 0.44 - 1.00 mg/dL   Calcium 7.2 (L)  8.9 - 10.3 mg/dL   Total Protein 4.6 (L) 6.5 - 8.1 g/dL   Albumin 1.8 (L) 3.5 - 5.0 g/dL   AST 19 15 - 41 U/L   ALT 11 (L) 14 - 54 U/L   Alkaline Phosphatase 67 38 - 126 U/L   Total Bilirubin 0.7 0.3 - 1.2 mg/dL   GFR calc non Af Amer 19 (L) >60 mL/min   GFR calc Af Amer 22 (L) >60 mL/min   Anion gap 10 5 - 15  Lactic acid, plasma   Collection Time: 03/14/17  8:06 AM  Result Value Ref Range   Lactic Acid, Venous 1.8 0.5 - 1.9 mmol/L  Procalcitonin   Collection Time: 03/14/17  8:06 AM  Result Value Ref Range   Procalcitonin 29.83 ng/mL  Protime-INR   Collection Time: 03/14/17  8:06 AM  Result Value Ref Range   Prothrombin Time 15.2 11.4 - 15.2 seconds   INR 1.19   APTT   Collection Time: 03/14/17  8:06 AM  Result Value Ref Range   aPTT 42 (H) 24 - 36 seconds  Blood gas, arterial   Collection Time: 03/14/17 10:00 AM  Result Value Ref Range   FIO2 0.21    Delivery systems ROOM AIR    pH, Arterial 7.387 7.350 - 7.450   pCO2 arterial 28.7 (L) 32.0 - 48.0 mmHg   pO2, Arterial 110 (H) 83.0 - 108.0 mmHg   Bicarbonate 16.9 (L) 20.0 - 28.0 mmol/L   Acid-base deficit 6.8 (H) 0.0 - 2.0 mmol/L   O2 Saturation 97.0 %   Collection site LEFT RADIAL    Drawn by [id number]    Sample type ARTERIAL    Allens test (pass/fail) PASS PASS  Lactic acid, plasma   Collection Time: 03/14/17 11:06 AM  Result Value Ref Range   Lactic Acid, Venous 2.0 (HH) 0.5 - 1.9 mmol/L  CBC   Collection Time: 03/14/17 11:11 AM  Result Value Ref Range   WBC 7.8 4.0 - 10.5 K/uL   RBC 2.92 (L) 3.87 - 5.11 MIL/uL   Hemoglobin 8.1 (L) 12.0 - 15.0 g/dL   HCT 09.6 (L) 04.5 - 40.9 %   MCV 80.8 78.0 - 100.0 fL   MCH 27.7 26.0 - 34.0 pg   MCHC 34.3 30.0 - 36.0 g/dL   RDW 81.1 91.4 - 78.2 %   Platelets 83 (L) 150 - 400 K/uL  Comprehensive metabolic panel   Collection Time: 03/14/17 11:11 AM  Result Value Ref Range   Sodium 132 (L) 135 - 145 mmol/L   Potassium 3.0 (L) 3.5 - 5.1 mmol/L   Chloride 105 101 - 111  mmol/L   CO2 18 (L) 22 - 32 mmol/L   Glucose, Bld 112 (H) 65 - 99 mg/dL   BUN 36 (H) 6 - 20 mg/dL   Creatinine, Ser 9.56 (H) 0.44 - 1.00 mg/dL   Calcium 7.4 (L) 8.9 - 10.3 mg/dL   Total Protein 5.6 (L) 6.5 - 8.1 g/dL   Albumin 2.1 (L) 3.5 - 5.0 g/dL   AST 19  15 - 41 U/L   ALT 12 (L) 14 - 54 U/L   Alkaline Phosphatase 68 38 - 126 U/L   Total Bilirubin 0.4 0.3 - 1.2 mg/dL   GFR calc non Af Amer 21 (L) >60 mL/min   GFR calc Af Amer 24 (L) >60 mL/min   Anion gap 9 5 - 15  Blood gas, arterial   Collection Time: 03/14/17  1:16 PM  Result Value Ref Range   FIO2 100.00    O2 Content 12.0 L/min   Delivery systems NON-REBREATHER OXYGEN MASK    pH, Arterial 7.428 7.350 - 7.450   pCO2 arterial 25.2 (L) 32.0 - 48.0 mmHg   pO2, Arterial 445 (H) 83.0 - 108.0 mmHg   Bicarbonate 16.4 (L) 20.0 - 28.0 mmol/L   Acid-base deficit 6.6 (H) 0.0 - 2.0 mmol/L   O2 Saturation 100.0 %   Collection site LEFT RADIAL    Drawn by [id number]    Sample type ARTERIAL    Allens test (pass/fail) PASS PASS  CBC   Collection Time: 03/15/17  5:37 AM  Result Value Ref Range   WBC 6.1 4.0 - 10.5 K/uL   RBC 2.64 (L) 3.87 - 5.11 MIL/uL   Hemoglobin 7.5 (L) 12.0 - 15.0 g/dL   HCT 04.521.0 (L) 40.936.0 - 81.146.0 %   MCV 79.5 78.0 - 100.0 fL   MCH 28.4 26.0 - 34.0 pg   MCHC 35.7 30.0 - 36.0 g/dL   RDW 91.414.5 78.211.5 - 95.615.5 %   Platelets 78 (L) 150 - 400 K/uL  Basic metabolic panel   Collection Time: 03/15/17  5:37 AM  Result Value Ref Range   Sodium 135 135 - 145 mmol/L   Potassium 3.0 (L) 3.5 - 5.1 mmol/L   Chloride 111 101 - 111 mmol/L   CO2 16 (L) 22 - 32 mmol/L   Glucose, Bld 77 65 - 99 mg/dL   BUN 24 (H) 6 - 20 mg/dL   Creatinine, Ser 2.132.06 (H) 0.44 - 1.00 mg/dL   Calcium 7.4 (L) 8.9 - 10.3 mg/dL   GFR calc non Af Amer 34 (L) >60 mL/min   GFR calc Af Amer 39 (L) >60 mL/min   Anion gap 8 5 - 15  Comprehensive metabolic panel   Collection Time: 03/15/17 10:47 AM  Result Value Ref Range   Sodium 134 (L) 135 - 145 mmol/L    Potassium 3.2 (L) 3.5 - 5.1 mmol/L   Chloride 111 101 - 111 mmol/L   CO2 15 (L) 22 - 32 mmol/L   Glucose, Bld 77 65 - 99 mg/dL   BUN 22 (H) 6 - 20 mg/dL   Creatinine, Ser 0.861.95 (H) 0.44 - 1.00 mg/dL   Calcium 7.5 (L) 8.9 - 10.3 mg/dL   Total Protein 5.1 (L) 6.5 - 8.1 g/dL   Albumin 1.9 (L) 3.5 - 5.0 g/dL   AST 14 (L) 15 - 41 U/L   ALT 10 (L) 14 - 54 U/L   Alkaline Phosphatase 67 38 - 126 U/L   Total Bilirubin 0.6 0.3 - 1.2 mg/dL   GFR calc non Af Amer 36 (L) >60 mL/min   GFR calc Af Amer 42 (L) >60 mL/min   Anion gap 8 5 - 15  CBC   Collection Time: 03/15/17 10:47 AM  Result Value Ref Range   WBC 6.9 4.0 - 10.5 K/uL   RBC 2.75 (L) 3.87 - 5.11 MIL/uL   Hemoglobin 7.7 (L) 12.0 - 15.0 g/dL   HCT 57.822.2 (  L) 36.0 - 46.0 %   MCV 80.7 78.0 - 100.0 fL   MCH 28.0 26.0 - 34.0 pg   MCHC 34.7 30.0 - 36.0 g/dL   RDW 16.1 09.6 - 04.5 %   Platelets 76 (L) 150 - 400 K/uL  Calcium, ionized   Collection Time: 03/15/17 10:47 AM  Result Value Ref Range   Calcium, Ionized, Serum 4.7 4.5 - 5.6 mg/dL  Lactic acid, plasma   Collection Time: 03/15/17 10:47 AM  Result Value Ref Range   Lactic Acid, Venous 0.7 0.5 - 1.9 mmol/L  Basic metabolic panel   Collection Time: 03/16/17  5:59 AM  Result Value Ref Range   Sodium 135 135 - 145 mmol/L   Potassium 3.1 (L) 3.5 - 5.1 mmol/L   Chloride 110 101 - 111 mmol/L   CO2 18 (L) 22 - 32 mmol/L   Glucose, Bld 80 65 - 99 mg/dL   BUN 18 6 - 20 mg/dL   Creatinine, Ser 4.09 (H) 0.44 - 1.00 mg/dL   Calcium 8.0 (L) 8.9 - 10.3 mg/dL   GFR calc non Af Amer 44 (L) >60 mL/min   GFR calc Af Amer 51 (L) >60 mL/min   Anion gap 7 5 - 15  CBC   Collection Time: 03/16/17  5:59 AM  Result Value Ref Range   WBC 7.8 4.0 - 10.5 K/uL   RBC 2.68 (L) 3.87 - 5.11 MIL/uL   Hemoglobin 7.5 (L) 12.0 - 15.0 g/dL   HCT 81.1 (L) 91.4 - 78.2 %   MCV 80.6 78.0 - 100.0 fL   MCH 28.0 26.0 - 34.0 pg   MCHC 34.7 30.0 - 36.0 g/dL   RDW 95.6 21.3 - 08.6 %   Platelets 74 (L) 150 -  400 K/uL  Prepare RBC   Collection Time: 03/16/17  9:00 AM  Result Value Ref Range   Order Confirmation ORDER PROCESSED BY BLOOD BANK   ECHOCARDIOGRAM COMPLETE   Collection Time: 03/16/17  9:19 AM  Result Value Ref Range   Weight 1,904 oz   Height 69 in   BP 94/38 mmHg  CBC   Collection Time: 03/16/17  7:54 PM  Result Value Ref Range   WBC 9.2 4.0 - 10.5 K/uL   RBC 3.67 (L) 3.87 - 5.11 MIL/uL   Hemoglobin 10.3 (L) 12.0 - 15.0 g/dL   HCT 57.8 (L) 46.9 - 62.9 %   MCV 80.7 78.0 - 100.0 fL   MCH 28.1 26.0 - 34.0 pg   MCHC 34.8 30.0 - 36.0 g/dL   RDW 52.8 41.3 - 24.4 %   Platelets 76 (L) 150 - 400 K/uL  Type and screen [facility] [place]   Collection Time: 03/16/17  7:54 PM  Result Value Ref Range   ABO/RH(D) A NEG    Antibody Screen NEG    Sample Expiration 03/19/2017   CBC   Collection Time: 03/18/17  5:33 AM  Result Value Ref Range   WBC 9.4 4.0 - 10.5 K/uL   RBC 3.55 (L) 3.87 - 5.11 MIL/uL   Hemoglobin 10.1 (L) 12.0 - 15.0 g/dL   HCT 01.0 (L) 27.2 - 53.6 %   MCV 81.1 78.0 - 100.0 fL   MCH 28.5 26.0 - 34.0 pg   MCHC 35.1 30.0 - 36.0 g/dL   RDW 64.4 03.4 - 74.2 %   Platelets 73 (L) 150 - 400 K/uL  Comprehensive metabolic panel   Collection Time: 03/18/17  5:33 AM  Result Value Ref Range  Sodium 139 135 - 145 mmol/L   Potassium 3.1 (L) 3.5 - 5.1 mmol/L   Chloride 110 101 - 111 mmol/L   CO2 21 (L) 22 - 32 mmol/L   Glucose, Bld 89 65 - 99 mg/dL   BUN 16 6 - 20 mg/dL   Creatinine, Ser 1.61 (H) 0.44 - 1.00 mg/dL   Calcium 8.1 (L) 8.9 - 10.3 mg/dL   Total Protein 5.2 (L) 6.5 - 8.1 g/dL   Albumin 2.0 (L) 3.5 - 5.0 g/dL   AST 19 15 - 41 U/L   ALT 11 (L) 14 - 54 U/L   Alkaline Phosphatase 77 38 - 126 U/L   Total Bilirubin 0.4 0.3 - 1.2 mg/dL   GFR calc non Af Amer >60 >60 mL/min   GFR calc Af Amer >60 >60 mL/min   Anion gap 8 5 - 15    Discharge Condition: Stable  Disposition: 01-Home or Self Care   Discharge Instructions    Discharge activity:   Bathroom / Shower only    Complete by:  As directed    Discharge activity: Bedrest    Complete by:  As directed    Discharge diet:  No restrictions    Complete by:  As directed    Fetal Kick Count:  Lie on our left side for one hour after a meal, and count the number of times your baby kicks.  If it is less than 5 times, get up, move around and drink some juice.  Repeat the test 30 minutes later.  If it is still less than 5 kicks in an hour, notify your doctor.    Complete by:  As directed    No sexual activity restrictions    Complete by:  As directed    Notify physician for a general feeling that "something is not right"    Complete by:  As directed    Notify physician for increase or change in vaginal discharge    Complete by:  As directed    Notify physician for intestinal cramps, with or without diarrhea, sometimes described as "gas pain"    Complete by:  As directed    Notify physician for leaking of fluid    Complete by:  As directed    Notify physician for low, dull backache, unrelieved by heat or Tylenol    Complete by:  As directed    Notify physician for menstrual like cramps    Complete by:  As directed    Notify physician for pelvic pressure    Complete by:  As directed    Notify physician for uterine contractions.  These may be painless and feel like the uterus is tightening or the baby is  "balling up"    Complete by:  As directed    Notify physician for vaginal bleeding    Complete by:  As directed    PRETERM LABOR:  Includes any of the follwing symptoms that occur between 20 - [redacted] weeks gestation.  If these symptoms are not stopped, preterm labor can result in preterm delivery, placing your baby at risk    Complete by:  As directed      Allergies as of 03/19/2017      Reactions   Penicillins Rash, Other (See Comments)   03/2017: took rocephin w/o issue. Has patient had a PCN reaction causing immediate rash, facial/tongue/throat swelling, SOB or lightheadedness with  hypotension: No Has patient had a PCN reaction causing severe rash involving mucus membranes or skin  necrosis: No Has patient had a PCN reaction that required hospitalization: No Has patient had a PCN reaction occurring within the last 10 years: No If all of the above answers are "NO", then may proceed with Cephalosporin use.      Medication List    TAKE these medications   cephALEXin 500 MG capsule Commonly known as:  KEFLEX Take 1 capsule (500 mg total) by mouth 4 (four) times daily.   PRENATAL GUMMIES/DHA & FA 0.4-32.5 MG Chew Chew 2 each by mouth daily.      Follow-up Information    [facility] Follow up in 1 week(s).   Specialty:  Obstetrics and Gynecology Why:  Please call for an appointment Contact information: [address] [place] [address] [phone]          Signed: Reva Bores M.D. 03/19/2017, 7:50 AM

## 2017-03-19 NOTE — Progress Notes (Signed)
Upon entering the room for hourly rounding, this patient was found to have left and taken her belongings. Patient was given discharge instructions hours ago and advised to let nursing staff know before leaving the floor so that she could be walked out.

## 2017-03-19 NOTE — Discharge Instructions (Signed)

## 2017-03-19 NOTE — Progress Notes (Signed)
Per Dr. Debroah LoopArnold, patient will not be on bedrest restrictions when discharged. Advised patient and requested Debroah Looprnold change the discharge instructions.

## 2017-03-24 ENCOUNTER — Encounter: Payer: Self-pay | Admitting: Advanced Practice Midwife

## 2017-03-24 ENCOUNTER — Ambulatory Visit (INDEPENDENT_AMBULATORY_CARE_PROVIDER_SITE_OTHER): Payer: Medicaid Other | Admitting: Advanced Practice Midwife

## 2017-03-24 VITALS — BP 100/60 | HR 76 | Wt 123.0 lb

## 2017-03-24 DIAGNOSIS — O26842 Uterine size-date discrepancy, second trimester: Secondary | ICD-10-CM

## 2017-03-24 DIAGNOSIS — Z331 Pregnant state, incidental: Secondary | ICD-10-CM | POA: Diagnosis not present

## 2017-03-24 DIAGNOSIS — O2341 Unspecified infection of urinary tract in pregnancy, first trimester: Secondary | ICD-10-CM

## 2017-03-24 DIAGNOSIS — O2342 Unspecified infection of urinary tract in pregnancy, second trimester: Secondary | ICD-10-CM | POA: Diagnosis not present

## 2017-03-24 DIAGNOSIS — Z1389 Encounter for screening for other disorder: Secondary | ICD-10-CM | POA: Diagnosis not present

## 2017-03-24 DIAGNOSIS — Z3A25 25 weeks gestation of pregnancy: Secondary | ICD-10-CM

## 2017-03-24 DIAGNOSIS — Z3402 Encounter for supervision of normal first pregnancy, second trimester: Secondary | ICD-10-CM

## 2017-03-24 LAB — POCT URINALYSIS DIPSTICK
Blood, UA: NEGATIVE
Glucose, UA: NEGATIVE
Ketones, UA: NEGATIVE
Nitrite, UA: NEGATIVE
Protein, UA: NEGATIVE

## 2017-03-24 MED ORDER — CEPHALEXIN 500 MG PO CAPS: 500.0000 mg | ORAL_CAPSULE | Freq: Every day | ORAL | 4 refills | Status: DC

## 2017-03-24 NOTE — Patient Instructions (Signed)

## 2017-03-24 NOTE — Progress Notes (Signed)
G1P0000 564w2d Estimated Date of Delivery: 07/05/17  Blood pressure 100/60, pulse 76, weight 123 lb (55.8 kg), last menstrual period 09/28/2016.    Was in hospital last week w/sepsis/pyelo. Feelng much better.  BP weight and urine results all reviewed and noted.Dips neg, will get POC next visit (still on tx dose of ABX).   Please refer to the obstetrical flow sheet for the fundal height and fetal heart rate documentation: FH is at umbilicus  Patient reports good fetal movement, denies any bleeding and no rupture of membranes symptoms or regular contractions. Patient is without complaints. All questions were answered.  Orders Placed This Encounter  Procedures  . Urine Culture  . US OB Follow Up  . POCT urinalysis dipstick    Plan:  Continued routine obstetrical care,   Return in about 2 weeks (around 04/07/2017) for US:EFW, PN2/LROB.

## 2017-03-26 LAB — URINE CULTURE

## 2017-04-07 ENCOUNTER — Other Ambulatory Visit: Payer: Medicaid Other

## 2017-04-07 ENCOUNTER — Encounter: Payer: Medicaid Other | Admitting: Women's Health

## 2017-04-09 ENCOUNTER — Encounter (HOSPITAL_COMMUNITY): Payer: Self-pay

## 2017-04-09 ENCOUNTER — Inpatient Hospital Stay (HOSPITAL_COMMUNITY)
Admission: AD | Admit: 2017-04-09 | Discharge: 2017-04-11 | DRG: 781 | Disposition: A | Discharge status: Home / Self Care | Payer: Medicaid Other | Source: Ambulatory Visit | Attending: Obstetrics and Gynecology | Admitting: Obstetrics and Gynecology

## 2017-04-09 DIAGNOSIS — O99112 Other diseases of the blood and blood-forming organs and certain disorders involving the immune mechanism complicating pregnancy, second trimester: Secondary | ICD-10-CM | POA: Diagnosis present

## 2017-04-09 DIAGNOSIS — O26892 Other specified pregnancy related conditions, second trimester: Secondary | ICD-10-CM | POA: Diagnosis present

## 2017-04-09 DIAGNOSIS — B9620 Unspecified Escherichia coli [E. coli] as the cause of diseases classified elsewhere: Secondary | ICD-10-CM | POA: Diagnosis present

## 2017-04-09 DIAGNOSIS — O98812 Other maternal infectious and parasitic diseases complicating pregnancy, second trimester: Secondary | ICD-10-CM | POA: Diagnosis present

## 2017-04-09 DIAGNOSIS — B373 Candidiasis of vulva and vagina: Secondary | ICD-10-CM | POA: Diagnosis present

## 2017-04-09 DIAGNOSIS — Z3A27 27 weeks gestation of pregnancy: Secondary | ICD-10-CM

## 2017-04-09 DIAGNOSIS — D6959 Other secondary thrombocytopenia: Secondary | ICD-10-CM | POA: Diagnosis present

## 2017-04-09 DIAGNOSIS — R109 Unspecified abdominal pain: Secondary | ICD-10-CM | POA: Diagnosis not present

## 2017-04-09 DIAGNOSIS — O2302 Infections of kidney in pregnancy, second trimester: Principal | ICD-10-CM | POA: Diagnosis present

## 2017-04-09 DIAGNOSIS — Z880 Allergy status to penicillin: Secondary | ICD-10-CM | POA: Diagnosis not present

## 2017-04-09 DIAGNOSIS — Z6791 Unspecified blood type, Rh negative: Secondary | ICD-10-CM | POA: Diagnosis not present

## 2017-04-09 DIAGNOSIS — O2303 Infections of kidney in pregnancy, third trimester: Secondary | ICD-10-CM | POA: Diagnosis present

## 2017-04-09 HISTORY — DX: Sepsis due to Escherichia coli (e. coli): A41.51

## 2017-04-09 LAB — CBC WITH DIFFERENTIAL/PLATELET
BASOS ABS: 0 10*3/uL (ref 0.0–0.1)
BASOS PCT: 0 %
Eosinophils Absolute: 0.1 10*3/uL (ref 0.0–0.7)
Eosinophils Relative: 0 %
HEMATOCRIT: 27.2 % — AB (ref 36.0–46.0)
HEMOGLOBIN: 9.1 g/dL — AB (ref 12.0–15.0)
Lymphocytes Relative: 15 %
Lymphs Abs: 1.7 10*3/uL (ref 0.7–4.0)
MCH: 28 pg (ref 26.0–34.0)
MCHC: 33.5 g/dL (ref 30.0–36.0)
MCV: 83.7 fL (ref 78.0–100.0)
Monocytes Absolute: 0.3 10*3/uL (ref 0.1–1.0)
Monocytes Relative: 3 %
NEUTROS ABS: 9.5 10*3/uL — AB (ref 1.7–7.7)
NEUTROS PCT: 82 %
Platelets: 120 10*3/uL — ABNORMAL LOW (ref 150–400)
RBC: 3.25 MIL/uL — ABNORMAL LOW (ref 3.87–5.11)
RDW: 14.4 % (ref 11.5–15.5)
WBC: 11.6 10*3/uL — ABNORMAL HIGH (ref 4.0–10.5)

## 2017-04-09 LAB — URINALYSIS, ROUTINE W REFLEX MICROSCOPIC
Bilirubin Urine: NEGATIVE
GLUCOSE, UA: NEGATIVE mg/dL
Ketones, ur: NEGATIVE mg/dL
NITRITE: POSITIVE — AB
PH: 6 (ref 5.0–8.0)
Specific Gravity, Urine: 1.013 (ref 1.005–1.030)
Squamous Epithelial / LPF: NONE SEEN

## 2017-04-09 LAB — COMPREHENSIVE METABOLIC PANEL
ALBUMIN: 3 g/dL — AB (ref 3.5–5.0)
ALK PHOS: 70 U/L (ref 38–126)
ALT: 9 U/L — ABNORMAL LOW (ref 14–54)
AST: 14 U/L — AB (ref 15–41)
Anion gap: 6 (ref 5–15)
BILIRUBIN TOTAL: 0.3 mg/dL (ref 0.3–1.2)
BUN: 6 mg/dL (ref 6–20)
CALCIUM: 8.2 mg/dL — AB (ref 8.9–10.3)
CO2: 21 mmol/L — ABNORMAL LOW (ref 22–32)
Chloride: 107 mmol/L (ref 101–111)
Creatinine, Ser: 0.56 mg/dL (ref 0.44–1.00)
GFR calc Af Amer: >60 mL/min (ref >60)
GFR calc non Af Amer: >60 mL/min (ref >60)
GLUCOSE: 91 mg/dL (ref 65–99)
POTASSIUM: 3.1 mmol/L — AB (ref 3.5–5.1)
Sodium: 134 mmol/L — ABNORMAL LOW (ref 135–145)
TOTAL PROTEIN: 6.8 g/dL (ref 6.5–8.1)

## 2017-04-09 LAB — TYPE AND SCREEN
ABO/RH(D): A NEG
Antibody Screen: NEGATIVE

## 2017-04-09 MED ORDER — DOCUSATE SODIUM 100 MG PO CAPS
100.0000 mg | ORAL_CAPSULE | Freq: Every day | ORAL | Status: DC
  Administered 2017-04-10: 100 mg via ORAL
  Filled 2017-04-09 (×2): qty 1

## 2017-04-09 MED ORDER — CALCIUM CARBONATE ANTACID 500 MG PO CHEW: 2.0000 | CHEWABLE_TABLET | ORAL | Status: DC | PRN

## 2017-04-09 MED ORDER — ONDANSETRON HCL 4 MG/2ML IJ SOLN
4.0000 mg | Freq: Once | INTRAMUSCULAR | Status: AC
  Administered 2017-04-09: 4 mg via INTRAVENOUS
  Filled 2017-04-09: qty 2

## 2017-04-09 MED ORDER — HYDROMORPHONE HCL 1 MG/ML IJ SOLN
0.5000 mg | Freq: Once | INTRAMUSCULAR | Status: AC
  Administered 2017-04-09: 0.5 mg via INTRAVENOUS
  Filled 2017-04-09: qty 1

## 2017-04-09 MED ORDER — LACTATED RINGERS IV SOLN
INTRAVENOUS | Status: DC
  Administered 2017-04-09 – 2017-04-10 (×3): via INTRAVENOUS

## 2017-04-09 MED ORDER — ACETAMINOPHEN 325 MG PO TABS: 650.0000 mg | ORAL_TABLET | ORAL | Status: DC | PRN

## 2017-04-09 MED ORDER — SODIUM CHLORIDE 0.9 % IV BOLUS (SEPSIS)
500.0000 mL | Freq: Once | INTRAVENOUS | Status: AC
  Administered 2017-04-09: 500 mL via INTRAVENOUS

## 2017-04-09 MED ORDER — DEXTROSE 5 % IV SOLN: 1.0000 g | Freq: Two times a day (BID) | INTRAVENOUS | Status: DC

## 2017-04-09 MED ORDER — DEXTROSE 5 % IV SOLN
2.0000 g | INTRAVENOUS | Status: DC
  Administered 2017-04-09 – 2017-04-11 (×3): 2 g via INTRAVENOUS
  Filled 2017-04-09 (×3): qty 2

## 2017-04-09 MED ORDER — PRENATAL MULTIVITAMIN CH
1.0000 | ORAL_TABLET | Freq: Every day | ORAL | Status: DC
  Administered 2017-04-09 – 2017-04-10 (×2): 1 via ORAL
  Filled 2017-04-09 (×3): qty 1

## 2017-04-09 MED ORDER — OXYCODONE-ACETAMINOPHEN 5-325 MG PO TABS
1.0000 | ORAL_TABLET | Freq: Four times a day (QID) | ORAL | Status: DC | PRN
  Administered 2017-04-09: 2 via ORAL
  Filled 2017-04-09: qty 2

## 2017-04-09 NOTE — MAU Provider Note (Signed)
History     CSN: [id number]  Arrival date and time: 04/09/17 [id number]  First Provider Initiated Contact with Patient 04/09/17 [phone]   Chief Complaint  Patient presents with  . Flank Pain  . Nausea   HPI April Young is a 21 y.o. G1P0000 at 7763w4d who presents with right flank pain. Patient was admitted to hospital earlier this morning for pyelo & sepsis (e.coli). Patient was prescribed keflex to take nightly but states she hasn't picked up her meds yet. Current symptoms began this morning around 5 am. Reports sudden onset of right flank pain. Describes as sharp/throbbing pain that she rates 10/10. Has not treated. States pain feels like previous pyelo. Endorse nausea & vomiting that began this morning. Denies abdominal pain, fever/chills, dysuria, or hematuria. Patient missed last OB appt d/t transportation issues.  Positive fetal movement.   OB History    Gravida Para Term Preterm AB Living   1 0 0 0 0 0   SAB TAB Ectopic Multiple Live Births   0              Past Medical History:  Diagnosis Date  . Abdominal pain affecting pregnancy 03/10/2017  . Candida vaginitis 03/10/2017  . Hx of trichomoniasis 11/2016  . Rh negative status during pregnancy   . Urinary tract infection 03/10/2017    Past Surgical History:  Procedure Laterality Date  . SKIN SURGERY     abcess    Family History  Problem Relation Age of Onset  . COPD Maternal Grandmother     Social History  Substance Use Topics  . Smoking status: Never Smoker  . Smokeless tobacco: Never Used  . Alcohol use No    Allergies:  Allergies  Allergen Reactions  . Penicillins Rash and Other (See Comments)    03/2017: took rocephin w/o issue. Has patient had a PCN reaction causing immediate rash, facial/tongue/throat swelling, SOB or lightheadedness with hypotension: No Has patient had a PCN reaction causing severe rash involving mucus membranes or skin necrosis: No Has patient had a PCN reaction that required  hospitalization: No Has patient had a PCN reaction occurring within the last 10 years: No If all of the above answers are "NO", then may proceed with Cephalosporin use.    Prescriptions Prior to Admission  Medication Sig Dispense Refill Last Dose  . cephALEXin (KEFLEX) 500 MG capsule Take 1 capsule (500 mg total) by mouth at bedtime. 30 capsule 4   . Prenatal MV-Min-FA-Omega-3 (PRENATAL GUMMIES/DHA & FA) 0.4-32.5 MG CHEW Chew 2 each by mouth daily.   Taking    Review of Systems  Constitutional: Negative.   Gastrointestinal: Positive for nausea and vomiting. Negative for abdominal pain, constipation and diarrhea.  Genitourinary: Positive for flank pain. Negative for dysuria, frequency, hematuria, vaginal bleeding and vaginal discharge.   Physical Exam   Blood pressure (!) 107/57, pulse 78, temperature 98.5 F (36.9 C), temperature source Oral, resp. rate 18, weight 121 lb (54.9 kg), last menstrual period 09/28/2016, SpO2 100 %.   Temp:  [98.4 F (36.9 C)-98.5 F (36.9 C)] 98.5 F (36.9 C) (06/29 1051) Pulse Rate:  [78-87] 78 (06/29 1051) Resp:  [18] 18 (06/29 1051) BP: (97-107)/(56-57) 107/57 (06/29 1051) SpO2:  [97 %-100 %] 100 % (06/29 1051) Weight:  [121 lb (54.9 kg)] 121 lb (54.9 kg) (06/29 [id number])  Physical Exam  Nursing note and vitals reviewed. Constitutional: She is oriented to person, place, and time. She appears well-developed and well-nourished. She appears distressed.  HENT:  Head: Normocephalic and atraumatic.  Eyes: Conjunctivae are normal. Right eye exhibits no discharge. Left eye exhibits no discharge. No scleral icterus.  Neck: Normal range of motion.  Cardiovascular: Normal rate, regular rhythm and normal heart sounds.   No murmur heard. Respiratory: Effort normal and breath sounds normal. No respiratory distress. She has no wheezes.  GI: Soft. There is CVA tenderness (right).  Neurological: She is alert and oriented to person, place, and time.  Skin: Skin is  warm and dry. She is not diaphoretic.  Psychiatric: She has a normal mood and affect. Her behavior is normal. Judgment and thought content normal.   Fetal Tracing:  Baseline: 135 Variability: moderate Accelerations:  Decelerations: intermittent tracing, active fetus   Toco: none MAU Course  Procedures Results for orders placed or performed during the hospital encounter of 04/09/17 (from the past 24 hour(s))  CBC with Differential/Platelet     Status: Abnormal   Collection Time: 04/09/17  9:34 AM  Result Value Ref Range   WBC 11.6 (H) 4.0 - 10.5 K/uL   RBC 3.25 (L) 3.87 - 5.11 MIL/uL   Hemoglobin 9.1 (L) 12.0 - 15.0 g/dL   HCT 40.9 (L) 81.1 - 91.4 %   MCV 83.7 78.0 - 100.0 fL   MCH 28.0 26.0 - 34.0 pg   MCHC 33.5 30.0 - 36.0 g/dL   RDW 78.2 95.6 - 21.3 %   Platelets 120 (L) 150 - 400 K/uL   Neutrophils Relative % 82 %   Neutro Abs 9.5 (H) 1.7 - 7.7 K/uL   Lymphocytes Relative 15 %   Lymphs Abs 1.7 0.7 - 4.0 K/uL   Monocytes Relative 3 %   Monocytes Absolute 0.3 0.1 - 1.0 K/uL   Eosinophils Relative 0 %   Eosinophils Absolute 0.1 0.0 - 0.7 K/uL   Basophils Relative 0 %   Basophils Absolute 0.0 0.0 - 0.1 K/uL  Comprehensive metabolic panel     Status: Abnormal   Collection Time: 04/09/17  9:34 AM  Result Value Ref Range   Sodium 134 (L) 135 - 145 mmol/L   Potassium 3.1 (L) 3.5 - 5.1 mmol/L   Chloride 107 101 - 111 mmol/L   CO2 21 (L) 22 - 32 mmol/L   Glucose, Bld 91 65 - 99 mg/dL   BUN 6 6 - 20 mg/dL   Creatinine, Ser 0.86 0.44 - 1.00 mg/dL   Calcium 8.2 (L) 8.9 - 10.3 mg/dL   Total Protein 6.8 6.5 - 8.1 g/dL   Albumin 3.0 (L) 3.5 - 5.0 g/dL   AST 14 (L) 15 - 41 U/L   ALT 9 (L) 14 - 54 U/L   Alkaline Phosphatase 70 38 - 126 U/L   Total Bilirubin 0.3 0.3 - 1.2 mg/dL   GFR calc non Af Amer >60 >60 mL/min   GFR calc Af Amer >60 >60 mL/min   Anion gap 6 5 - 15  Urinalysis, Routine w reflex microscopic     Status: Abnormal   Collection Time: 04/09/17 10:09 AM  Result  Value Ref Range   Color, Urine YELLOW YELLOW   APPearance CLOUDY (A) CLEAR   Specific Gravity, Urine 1.013 1.005 - 1.030   pH 6.0 5.0 - 8.0   Glucose, UA NEGATIVE NEGATIVE mg/dL   Hgb urine dipstick MODERATE (A) NEGATIVE   Bilirubin Urine NEGATIVE NEGATIVE   Ketones, ur NEGATIVE NEGATIVE mg/dL   Protein, ur >=578 (A) NEGATIVE mg/dL   Nitrite POSITIVE (A) NEGATIVE   Leukocytes, UA MODERATE (A)  NEGATIVE   RBC / HPF TOO NUMEROUS TO COUNT 0 - 5 RBC/hpf   WBC, UA TOO NUMEROUS TO COUNT 0 - 5 WBC/hpf   Bacteria, UA MANY (A) NONE SEEN   Squamous Epithelial / LPF NONE SEEN NONE SEEN   WBC Clumps PRESENT    Mucous PRESENT     MDM Intermittent monitoring d/t active fetus, fetal movement palpated & heard on monitor CBC w/diff, CMP IV fluid bolus (normal saline), zofran 4 mg, dilaudid 0.5mg  Cath U/a + nitrites -- urine culture pending S/w Dr. Jolayne Panther. Will admit for pyelo Assessment and Plan  A: 1. Pyelonephritis affecting pregnancy in second trimester    P: Admit to antenatal IV antibiotics Urine culture pending  Judeth Horn 04/09/2017, 8:54 AM

## 2017-04-09 NOTE — MAU Note (Signed)
Report given to Ardyth Maneri Tauber RN High Risk OB received room assignment of 311.

## 2017-04-09 NOTE — H&P (Signed)
HPI April Young is a 21 y.o. G1P0000 at 7339w4d who presents with right flank pain. Patient was admitted to hospital earlier this morning for pyelo & sepsis (e.coli). Patient was prescribed keflex to take nightly but states she hasn't picked up her meds yet. Current symptoms began this morning around 5 am. Reports sudden onset of right flank pain. Describes as sharp/throbbing pain that she rates 10/10. Has not treated. States pain feels like previous pyelo. Endorse nausea & vomiting that began this morning. Denies abdominal pain, fever/chills, dysuria, or hematuria. Patient missed last OB appt d/t transportation issues.  Positive fetal movement.   Patient Active Problem List   Diagnosis Date Noted  . Gestational thrombocytopenia (HCC) 03/16/2017  . Anemia affecting pregnancy in second trimester 03/15/2017  . Bacteremia due to Escherichia coli 03/15/2017  . Pyelonephritis 03/13/2017  . Supervision of normal first pregnancy 01/19/2017  . Late prenatal care 01/19/2017    Prenatal History/Complications:  Past Medical History: Past Medical History:  Diagnosis Date  . Abdominal pain affecting pregnancy 03/10/2017  . Candida vaginitis 03/10/2017  . Hx of trichomoniasis 11/2016  . Rh negative status during pregnancy   . Sepsis due to Escherichia coli (HCC) 03/2017  . Urinary tract infection 03/10/2017    Past Surgical History: Past Surgical History:  Procedure Laterality Date  . SKIN SURGERY     abcess    Obstetrical History: OB History    Gravida Para Term Preterm AB Living   1 0 0 0 0 0   SAB TAB Ectopic Multiple Live Births   0              Gynecological History: OB History    Gravida Para Term Preterm AB Living   1 0 0 0 0 0   SAB TAB Ectopic Multiple Live Births   0              Social History: Social History   Social History  . Marital status: Single    Spouse name: N/A  . Number of children: N/A  . Years of education: N/A   Social History Main Topics  .  Smoking status: Never Smoker  . Smokeless tobacco: Never Used  . Alcohol use No  . Drug use: Yes    Types: Marijuana     Comment: states she stopped with pregnancy  . Sexual activity: Yes    Birth control/ protection: None   Other Topics Concern  . None   Social History Narrative  . None    Family History: Family History  Problem Relation Age of Onset  . COPD Maternal Grandmother     Allergies: Allergies  Allergen Reactions  . Penicillins Rash and Other (See Comments)    03/2017: took rocephin w/o issue. Has patient had a PCN reaction causing immediate rash, facial/tongue/throat swelling, SOB or lightheadedness with hypotension: No Has patient had a PCN reaction causing severe rash involving mucus membranes or skin necrosis: No Has patient had a PCN reaction that required hospitalization: No Has patient had a PCN reaction occurring within the last 10 years: No If all of the above answers are "NO", then may proceed with Cephalosporin use.    Prescriptions Prior to Admission  Medication Sig Dispense Refill Last Dose  . Prenatal MV-Min-FA-Omega-3 (PRENATAL GUMMIES/DHA & FA) 0.4-32.5 MG CHEW Chew 2 each by mouth daily.   Past Month at Unknown time  . cephALEXin (KEFLEX) 500 MG capsule Take 1 capsule (500 mg total) by mouth at bedtime. (Patient  not taking: Reported on 04/09/2017) 30 capsule 4 Not Taking at Unknown time    Review of Systems - History obtained from the patient General ROS: negative for - chills or fever Respiratory ROS: no cough, shortness of breath, or wheezing Cardiovascular ROS: no chest pain or dyspnea on exertion Gastrointestinal ROS: positive for - nausea/vomiting negative for - abdominal pain, constipation or diarrhea Genito-Urinary ROS: positive for - flank pain negative for - dysuria, hematuria or vaginal bleeding  Blood pressure (!) 107/57, pulse 78, temperature 98.5 F (36.9 C), temperature source Oral, resp. rate 18, weight 121 lb (54.9 kg), last  menstrual period 09/28/2016, SpO2 100 %. Physical Exam  Constitutional: She is oriented to person, place, and time. She appears well-developed and well-nourished. She appears distressed.  HENT:  Head: Normocephalic and atraumatic.  Cardiovascular: Normal rate and regular rhythm.   Pulmonary/Chest: Effort normal and breath sounds normal. No respiratory distress. She has no wheezes.  Abdominal: Soft. Bowel sounds are normal.  Right CVAT  Musculoskeletal: She exhibits no edema.  Neurological: She is alert and oriented to person, place, and time.  Skin: Skin is warm and dry.  Psychiatric: She has a normal mood and affect. Her behavior is normal. Judgment and thought content normal.    Fetal Tracing:  Baseline: 135 Variability: moderate Accelerations:  Decelerations: intermittent tracing d/t fetal movement & pt discomfort       Assessment:  Pyelonephritis   Plan:  1. Admit to BS per consult with MD    Judeth Horn, NP 04/09/2017, 10:59 AM

## 2017-04-09 NOTE — MAU Note (Signed)
Pt presents to MAU with complaints of lower back pain. Pt was admitted for pyleo and sent home on June the 8th and states the pain feels the same. Denies any VB or abnormal discharge

## 2017-04-09 NOTE — MAU Note (Signed)
Patient unable to provide urine sample

## 2017-04-10 DIAGNOSIS — Z3A27 27 weeks gestation of pregnancy: Secondary | ICD-10-CM

## 2017-04-10 DIAGNOSIS — O2302 Infections of kidney in pregnancy, second trimester: Principal | ICD-10-CM

## 2017-04-10 LAB — CBC
HCT: 26.8 % — ABNORMAL LOW (ref 36.0–46.0)
Hemoglobin: 9 g/dL — ABNORMAL LOW (ref 12.0–15.0)
MCH: 28.1 pg (ref 26.0–34.0)
MCHC: 33.6 g/dL (ref 30.0–36.0)
MCV: 83.8 fL (ref 78.0–100.0)
PLATELETS: 121 10*3/uL — AB (ref 150–400)
RBC: 3.2 MIL/uL — ABNORMAL LOW (ref 3.87–5.11)
RDW: 14.5 % (ref 11.5–15.5)
WBC: 11.2 10*3/uL — AB (ref 4.0–10.5)

## 2017-04-10 MED ORDER — SODIUM CHLORIDE 0.9 % IV SOLN
INTRAVENOUS | Status: DC
  Administered 2017-04-10 – 2017-04-11 (×4): via INTRAVENOUS

## 2017-04-10 NOTE — Progress Notes (Signed)
[facility] PROGRESS NOTE  April Young is a 21 y.o. G1P0000 at 80w5dwho is admitted for second episode of pyelonephritis this month.  Was discharged on 03/13/17; patient reported not taking her outpatient antibiotics.  Estimated Date of Delivery: 07/05/17 Fetal presentation is unsure.  Length of Stay:  1 Days. Admitted 04/09/2017  Subjective: Reports decreased back pain. Patient reports good fetal movement.  She reports no uterine contractions, no bleeding and no loss of fluid per vagina.  Vitals:  Blood pressure (!) 91/49, pulse 95, temperature 99.1 F (37.3 C), temperature source Oral, resp. rate 16, height '5\' 9"'$  (1.753 m), weight 121 lb (54.9 kg), last menstrual period 09/28/2016, SpO2 100 %.  Patient Vitals for the past 24 hrs:  BP Temp Temp src Pulse Resp SpO2 Height Weight  04/10/17 0446 (!) 91/49 99.1 F (37.3 C) Oral 95 16 100 % - -  04/09/17 2325 (!) 100/54 99.2 F (37.3 C) Oral (!) 107 18 100 % - -  04/09/17 2015 (!) 99/59 99.2 F (37.3 C) Oral 98 18 100 % - -  04/09/17 1639 (!) 97/46 98.8 F (37.1 C) Oral 82 18 99 % - -  04/09/17 1150 (!) 97/53 98.6 F (37 C) Oral 90 18 100 % '5\' 9"'$  (1.753 m) 121 lb (54.9 kg)  04/09/17 1100 - - - - - 100 % - -  04/09/17 1051 (!) 107/57 98.5 F (36.9 C) Oral 78 18 100 % - -  04/09/17 0935 - - - - - 97 % - -  04/09/17 0828 (!) 97/56 98.4 F (36.9 C) - 87 18 - - 121 lb (54.9 kg)    Physical Examination: CONSTITUTIONAL: Well-developed, well-nourished female in no acute distress.  HENT:  Normocephalic, atraumatic, External right and left ear normal. Oropharynx is clear and moist EYES: Conjunctivae and EOM are normal. Pupils are equal, round, and reactive to light. No scleral icterus.  NECK: Normal range of motion, supple, no masses SKIN: Skin is warm and dry. No rash noted. Not diaphoretic. No erythema. No pallor. [place] and oriented to person, place, and time. Normal reflexes, muscle tone  coordination. No cranial nerve deficit noted. PSYCHIATRIC: Normal mood and affect. Normal behavior. Normal judgment and thought content. CARDIOVASCULAR: Normal heart rate noted, regular rhythm RESPIRATORY: Effort and breath sounds normal, no problems with respiration noted MUSCULOSKELETAL: Normal range of motion. No edema and no tenderness. 2+ distal pulses. ABDOMEN: Soft, nontender, nondistended, gravid. CERVIX:    Fetal monitoring: FHR: 145 bpm, Variability: moderate, Accelerations: 10 x 10 Present, Decelerations: Absent  Uterine activity: No contractions   Results for orders placed or performed during the hospital encounter of 04/09/17 (from the past 48 hour(s))  CBC with Differential/Platelet     Status: Abnormal   Collection Time: 04/09/17  9:34 AM  Result Value Ref Range   WBC 11.6 (H) 4.0 - 10.5 K/uL   RBC 3.25 (L) 3.87 - 5.11 MIL/uL   Hemoglobin 9.1 (L) 12.0 - 15.0 g/dL   HCT 27.2 (L) 36.0 - 46.0 %   MCV 83.7 78.0 - 100.0 fL   MCH 28.0 26.0 - 34.0 pg   MCHC 33.5 30.0 - 36.0 g/dL   RDW 14.4 11.5 - 15.5 %   Platelets 120 (L) 150 - 400 K/uL   Neutrophils Relative % 82 %   Neutro Abs 9.5 (H) 1.7 - 7.7 K/uL   Lymphocytes Relative 15 %   Lymphs Abs 1.7 0.7 - 4.0 K/uL   Monocytes Relative  3 %   Monocytes Absolute 0.3 0.1 - 1.0 K/uL   Eosinophils Relative 0 %   Eosinophils Absolute 0.1 0.0 - 0.7 K/uL   Basophils Relative 0 %   Basophils Absolute 0.0 0.0 - 0.1 K/uL  Comprehensive metabolic panel     Status: Abnormal   Collection Time: 04/09/17  9:34 AM  Result Value Ref Range   Sodium 134 (L) 135 - 145 mmol/L   Potassium 3.1 (L) 3.5 - 5.1 mmol/L   Chloride 107 101 - 111 mmol/L   CO2 21 (L) 22 - 32 mmol/L   Glucose, Bld 91 65 - 99 mg/dL   BUN 6 6 - 20 mg/dL   Creatinine, Ser 0.56 0.44 - 1.00 mg/dL   Calcium 8.2 (L) 8.9 - 10.3 mg/dL   Total Protein 6.8 6.5 - 8.1 g/dL   Albumin 3.0 (L) 3.5 - 5.0 g/dL   AST 14 (L) 15 - 41 U/L   ALT 9 (L) 14 - 54 U/L   Alkaline Phosphatase  70 38 - 126 U/L   Total Bilirubin 0.3 0.3 - 1.2 mg/dL   GFR calc non Af Amer >60 >60 mL/min   GFR calc Af Amer >60 >60 mL/min    Comment: (NOTE) The eGFR has been calculated using the CKD EPI equation. This calculation has not been validated in all clinical situations. eGFR's persistently <60 mL/min signify possible Chronic Kidney Disease.    Anion gap 6 5 - 15  Urinalysis, Routine w reflex microscopic     Status: Abnormal   Collection Time: 04/09/17 10:09 AM  Result Value Ref Range   Color, Urine YELLOW YELLOW   APPearance CLOUDY (A) CLEAR   Specific Gravity, Urine 1.013 1.005 - 1.030   pH 6.0 5.0 - 8.0   Glucose, UA NEGATIVE NEGATIVE mg/dL   Hgb urine dipstick MODERATE (A) NEGATIVE   Bilirubin Urine NEGATIVE NEGATIVE   Ketones, ur NEGATIVE NEGATIVE mg/dL   Protein, ur >=300 (A) NEGATIVE mg/dL   Nitrite POSITIVE (A) NEGATIVE   Leukocytes, UA MODERATE (A) NEGATIVE   RBC / HPF TOO NUMEROUS TO COUNT 0 - 5 RBC/hpf   WBC, UA TOO NUMEROUS TO COUNT 0 - 5 WBC/hpf   Bacteria, UA MANY (A) NONE SEEN   Squamous Epithelial / LPF NONE SEEN NONE SEEN   WBC Clumps PRESENT    Mucous PRESENT   Type and screen [place]     Status: None   Collection Time: 04/09/17 12:17 PM  Result Value Ref Range   ABO/RH(D) A NEG    Antibody Screen NEG    Sample Expiration 04/12/2017   CBC     Status: Abnormal   Collection Time: 04/10/17  5:42 AM  Result Value Ref Range   WBC 11.2 (H) 4.0 - 10.5 K/uL   RBC 3.20 (L) 3.87 - 5.11 MIL/uL   Hemoglobin 9.0 (L) 12.0 - 15.0 g/dL   HCT 26.8 (L) 36.0 - 46.0 %   MCV 83.8 78.0 - 100.0 fL   MCH 28.1 26.0 - 34.0 pg   MCHC 33.6 30.0 - 36.0 g/dL   RDW 14.5 11.5 - 15.5 %   Platelets 121 (L) 150 - 400 K/uL    No results found.  Current scheduled medications . docusate sodium  100 mg Oral Daily  . prenatal multivitamin  1 tablet Oral Q1200  Rocephin 2g IV q24h  I have reviewed the patient's current medications.  ASSESSMENT: Active  Problems:   Pyelonephritis complicating pregnancy, antepartum, third  trimester   PLAN: Continue Rocephin for at least 48 hours total, then transition to oral medications as long as patient is improving. Reassuring FHR tracing. Continue routine antenatal care.   Verita Schneiders, MD, [place] Attending [place], [facility]

## 2017-04-11 LAB — CULTURE, OB URINE

## 2017-04-11 MED ORDER — CEPHALEXIN 500 MG PO CAPS: 500.0000 mg | ORAL_CAPSULE | Freq: Four times a day (QID) | ORAL | 0 refills | Status: DC

## 2017-04-11 NOTE — Discharge Summary (Signed)
Antenatal Physician Discharge Summary  Patient ID: April ByarsCierra N Young MRN: [id number] DOB/AGE: 30-Sep-1996 21 y.o.  Admit date: 04/09/2017 Discharge date: 04/11/2017  Admission Diagnoses: Active Problems:   Pyelonephritis complicating pregnancy, antepartum, third trimester    Discharge Diagnoses: Same  Prenatal Procedures: NST and IV Abx  Consults: None  Hospital Course:  This is a 21 y.o. G1P0000 with IUP at 5385w6d admitted for recurrent pyelonphritis. Previously admitted with pyelo and sepsis with E.coli. Placed on Keflex, but not taking prophylaxis at time of admission. Came in with CVA tenderness. No fever and  WBC was 11.6. She was admitted and placed on rocephin. Urine culture reveals E.coli again with same sensitivities. She remained afebrile x 48 hours. She was deemed stable for discharge to home with outpatient follow up.  Discharge Exam: Temp:  [98.2 F (36.8 C)-98.9 F (37.2 C)] 98.4 F (36.9 C) (07/01 0910) Pulse Rate:  [79-92] 79 (07/01 0910) Resp:  [16-18] 17 (07/01 0910) BP: (94-107)/(51-70) 94/51 (07/01 0910) SpO2:  [98 %-100 %] 98 % (07/01 0910) Physical Examination: CONSTITUTIONAL: Well-developed, well-nourished female in no acute distress.  HENT:  Normocephalic, atraumatic EYES: Conjunctivae and EOM are normal. . No scleral icterus.  NECK: Normal range of motion, supple, no masses SKIN: Skin is warm and dry. No rash noted. Not diaphoretic. No erythema. No pallor. NEUROLGIC: Alert and oriented to person, place, and time.  CARDIOVASCULAR: Normal heart rate noted, regular rhythm RESPIRATORY: Effort and breath sounds normal MUSCULOSKELETAL: Normal range of motion. No edema and no tenderness.  ABDOMEN: Soft, nontender, nondistended, gravid.  Fetal monitoring: FHR: 135 bpm, Variability: moderate, Accelerations: Present, Decelerations: Absent  Uterine activity: 0 contractions per hour  Significant Diagnostic Studies:  Results for orders placed or performed  during the hospital encounter of 04/09/17 (from the past 168 hour(s))  CBC with Differential/Platelet   Collection Time: 04/09/17  9:34 AM  Result Value Ref Range   WBC 11.6 (H) 4.0 - 10.5 K/uL   RBC 3.25 (L) 3.87 - 5.11 MIL/uL   Hemoglobin 9.1 (L) 12.0 - 15.0 g/dL   HCT 82.927.2 (L) 56.236.0 - 13.046.0 %   MCV 83.7 78.0 - 100.0 fL   MCH 28.0 26.0 - 34.0 pg   MCHC 33.5 30.0 - 36.0 g/dL   RDW 86.514.4 78.411.5 - 69.615.5 %   Platelets 120 (L) 150 - 400 K/uL   Neutrophils Relative % 82 %   Neutro Abs 9.5 (H) 1.7 - 7.7 K/uL   Lymphocytes Relative 15 %   Lymphs Abs 1.7 0.7 - 4.0 K/uL   Monocytes Relative 3 %   Monocytes Absolute 0.3 0.1 - 1.0 K/uL   Eosinophils Relative 0 %   Eosinophils Absolute 0.1 0.0 - 0.7 K/uL   Basophils Relative 0 %   Basophils Absolute 0.0 0.0 - 0.1 K/uL  Comprehensive metabolic panel   Collection Time: 04/09/17  9:34 AM  Result Value Ref Range   Sodium 134 (L) 135 - 145 mmol/L   Potassium 3.1 (L) 3.5 - 5.1 mmol/L   Chloride 107 101 - 111 mmol/L   CO2 21 (L) 22 - 32 mmol/L   Glucose, Bld 91 65 - 99 mg/dL   BUN 6 6 - 20 mg/dL   Creatinine, Ser 2.950.56 0.44 - 1.00 mg/dL   Calcium 8.2 (L) 8.9 - 10.3 mg/dL   Total Protein 6.8 6.5 - 8.1 g/dL   Albumin 3.0 (L) 3.5 - 5.0 g/dL   AST 14 (L) 15 - 41 U/L   ALT 9 (L) 14 -  54 U/L   Alkaline Phosphatase 70 38 - 126 U/L   Total Bilirubin 0.3 0.3 - 1.2 mg/dL   GFR calc non Af Amer >60 >60 mL/min   GFR calc Af Amer >60 >60 mL/min   Anion gap 6 5 - 15  Culture, OB Urine   Collection Time: 04/09/17 10:09 AM  Result Value Ref Range   Specimen Description OB CATHETERIZED    Special Requests NONE    Culture >=100,000 COLONIES/mL ESCHERICHIA COLI (A)    Report Status 04/11/2017 FINAL    Organism ID, Bacteria ESCHERICHIA COLI (A)       Susceptibility   Escherichia coli - MIC*    AMPICILLIN >=32 RESISTANT Resistant     CEFAZOLIN <=4 SENSITIVE Sensitive     CEFEPIME <=1 SENSITIVE Sensitive     CEFTAZIDIME <=1 SENSITIVE Sensitive      CEFTRIAXONE <=1 SENSITIVE Sensitive     CIPROFLOXACIN <=0.25 SENSITIVE Sensitive     GENTAMICIN <=1 SENSITIVE Sensitive     IMIPENEM <=0.25 SENSITIVE Sensitive     TRIMETH/SULFA <=20 SENSITIVE Sensitive     AMPICILLIN/SULBACTAM 16 INTERMEDIATE Intermediate     PIP/TAZO <=4 SENSITIVE Sensitive     Extended ESBL NEGATIVE Sensitive     * >=100,000 COLONIES/mL ESCHERICHIA COLI  Urinalysis, Routine w reflex microscopic   Collection Time: 04/09/17 10:09 AM  Result Value Ref Range   Color, Urine YELLOW YELLOW   APPearance CLOUDY (A) CLEAR   Specific Gravity, Urine 1.013 1.005 - 1.030   pH 6.0 5.0 - 8.0   Glucose, UA NEGATIVE NEGATIVE mg/dL   Hgb urine dipstick MODERATE (A) NEGATIVE   Bilirubin Urine NEGATIVE NEGATIVE   Ketones, ur NEGATIVE NEGATIVE mg/dL   Protein, ur >=629 (A) NEGATIVE mg/dL   Nitrite POSITIVE (A) NEGATIVE   Leukocytes, UA MODERATE (A) NEGATIVE   RBC / HPF TOO NUMEROUS TO COUNT 0 - 5 RBC/hpf   WBC, UA TOO NUMEROUS TO COUNT 0 - 5 WBC/hpf   Bacteria, UA MANY (A) NONE SEEN   Squamous Epithelial / LPF NONE SEEN NONE SEEN   WBC Clumps PRESENT    Mucous PRESENT   Type and screen [facility]   Collection Time: 04/09/17 12:17 PM  Result Value Ref Range   ABO/RH(D) A NEG    Antibody Screen NEG    Sample Expiration 04/12/2017   CBC   Collection Time: 04/10/17  5:42 AM  Result Value Ref Range   WBC 11.2 (H) 4.0 - 10.5 K/uL   RBC 3.20 (L) 3.87 - 5.11 MIL/uL   Hemoglobin 9.0 (L) 12.0 - 15.0 g/dL   HCT 52.8 (L) 41.3 - 24.4 %   MCV 83.8 78.0 - 100.0 fL   MCH 28.1 26.0 - 34.0 pg   MCHC 33.6 30.0 - 36.0 g/dL   RDW 01.0 27.2 - 53.6 %   Platelets 121 (L) 150 - 400 K/uL    Discharge Condition: Stable  Disposition: 01-Home or Self Care   Discharge Instructions    Discharge activity:  No Restrictions    Complete by:  As directed    Discharge diet:  No restrictions    Complete by:  As directed    Fetal Kick Count:  Lie on our left side for one hour  after a meal, and count the number of times your baby kicks.  If it is less than 5 times, get up, move around and drink some juice.  Repeat the test 30 minutes later.  If it is  still less than 5 kicks in an hour, notify your doctor.    Complete by:  As directed    No sexual activity restrictions    Complete by:  As directed    Notify physician for a general feeling that "something is not right"    Complete by:  As directed    Notify physician for increase or change in vaginal discharge    Complete by:  As directed    Notify physician for intestinal cramps, with or without diarrhea, sometimes described as "gas pain"    Complete by:  As directed    Notify physician for leaking of fluid    Complete by:  As directed    Notify physician for low, dull backache, unrelieved by heat or Tylenol    Complete by:  As directed    Notify physician for menstrual like cramps    Complete by:  As directed    Notify physician for pelvic pressure    Complete by:  As directed    Notify physician for uterine contractions.  These may be painless and feel like the uterus is tightening or the baby is  "balling up"    Complete by:  As directed    Notify physician for vaginal bleeding    Complete by:  As directed    PRETERM LABOR:  Includes any of the follwing symptoms that occur between 20 - [redacted] weeks gestation.  If these symptoms are not stopped, preterm labor can result in preterm delivery, placing your baby at risk    Complete by:  As directed      Allergies as of 04/11/2017      Reactions   Penicillins Rash, Other (See Comments)   03/2017: took rocephin w/o issue. Has patient had a PCN reaction causing immediate rash, facial/tongue/throat swelling, SOB or lightheadedness with hypotension: No Has patient had a PCN reaction causing severe rash involving mucus membranes or skin necrosis: No Has patient had a PCN reaction that required hospitalization: No Has patient had a PCN reaction occurring within the last 10  years: No If all of the above answers are "NO", then may proceed with Cephalosporin use.      Medication List    TAKE these medications   cephALEXin 500 MG capsule Commonly known as:  KEFLEX Take 1 capsule (500 mg total) by mouth 4 (four) times daily. What changed:  when to take this   PRENATAL GUMMIES/DHA & FA 0.4-32.5 MG Chew Chew 2 each by mouth daily.      Follow-up Information    [facility] Follow up in 1 week(s).   Specialty:  Obstetrics and Gynecology Why:  call and reschedule your appointment tomorrow. Contact information: [address] [place] [address] [phone]          Signed: Reva Bores M.D. 04/11/2017, 9:51 AM

## 2017-04-11 NOTE — Progress Notes (Signed)
Pharmacy for patient is closed today, Dr. Erin FullingHarraway-Smith notified and verlbal consent for RN [facility] [place] for Keflex 500 mg PO 4x/day for a total of 7 days with no refills.

## 2017-04-11 NOTE — Discharge Instructions (Signed)

## 2017-04-13 ENCOUNTER — Encounter: Payer: Self-pay | Admitting: Obstetrics and Gynecology

## 2017-04-13 ENCOUNTER — Ambulatory Visit (INDEPENDENT_AMBULATORY_CARE_PROVIDER_SITE_OTHER): Payer: Medicaid Other | Admitting: Obstetrics and Gynecology

## 2017-04-13 ENCOUNTER — Other Ambulatory Visit: Payer: Self-pay | Admitting: Advanced Practice Midwife

## 2017-04-13 ENCOUNTER — Ambulatory Visit (INDEPENDENT_AMBULATORY_CARE_PROVIDER_SITE_OTHER): Payer: Medicaid Other

## 2017-04-13 VITALS — BP 90/52 | HR 70 | Wt 123.6 lb

## 2017-04-13 DIAGNOSIS — Z3A28 28 weeks gestation of pregnancy: Secondary | ICD-10-CM

## 2017-04-13 DIAGNOSIS — Z1389 Encounter for screening for other disorder: Secondary | ICD-10-CM | POA: Diagnosis not present

## 2017-04-13 DIAGNOSIS — A599 Trichomoniasis, unspecified: Secondary | ICD-10-CM | POA: Diagnosis not present

## 2017-04-13 DIAGNOSIS — O26842 Uterine size-date discrepancy, second trimester: Secondary | ICD-10-CM | POA: Diagnosis not present

## 2017-04-13 DIAGNOSIS — O2303 Infections of kidney in pregnancy, third trimester: Secondary | ICD-10-CM | POA: Diagnosis not present

## 2017-04-13 DIAGNOSIS — Z331 Pregnant state, incidental: Secondary | ICD-10-CM | POA: Diagnosis not present

## 2017-04-13 DIAGNOSIS — O98312 Other infections with a predominantly sexual mode of transmission complicating pregnancy, second trimester: Secondary | ICD-10-CM

## 2017-04-13 DIAGNOSIS — Z3402 Encounter for supervision of normal first pregnancy, second trimester: Secondary | ICD-10-CM

## 2017-04-13 DIAGNOSIS — O0930 Supervision of pregnancy with insufficient antenatal care, unspecified trimester: Secondary | ICD-10-CM

## 2017-04-13 LAB — POCT URINALYSIS DIPSTICK
Blood, UA: NEGATIVE
GLUCOSE UA: NEGATIVE
KETONES UA: NEGATIVE
Nitrite, UA: NEGATIVE
Protein, UA: NEGATIVE

## 2017-04-13 MED ORDER — METRONIDAZOLE 500 MG PO TABS: 500.0000 mg | ORAL_TABLET | Freq: Two times a day (BID) | ORAL | 0 refills | Status: DC

## 2017-04-13 NOTE — Progress Notes (Signed)
US 28+1 wks,cephalic,post pl gr 1,normal ovaries bilat,cx 3.1 cm,afi 19 cm,fhr 144 bpm,BPP 8/8,RI .63,.57 S/D 2.3 =22%,EFW 1067 g 38%, AC 2.4%

## 2017-04-13 NOTE — Progress Notes (Signed)
G1P0000  [facility]  Chief Complaint  Patient presents with  . Routine Prenatal Visit    US today  ____  Patient complaints:Patient remains noncompliant with therapy. She was a hospital over the weekend states she was once again treated for back pain for presumed infection but once again she did not pick up her Keflex prescription yet Additionally the patient has resumed sexual activity with her partner who did not get treatment for his Trichomonas. She has recurrent discharge so I'm going to treat her for trichomoniasis again. Patient reports   good fetal movement,                           denies any bleeding , rupture of membranes,or regular contractions. Flank pain has resolved Patient alleges that she'll get her medicines this time  Blood pressure (!) 90/52, pulse 70, weight 123 lb 9.6 oz (56.1 kg), last menstrual period 09/28/2016.   Urine results:notable for 2+ leukocytes refer to the ob flow sheet for FH and FHR, ,                          Physical Examination: General appearance - alert, well appearing, and in no distress                                      Abdomen - FH 25 ,                                                         -FHR 145                                                                                              Pelvic -                                             Questions were answered. 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 Importance of compliance with therapy emphasized again 2. Metronidazole 500 twice a day 7 days for patient, 2 g prescription for partner written (expedited therapy) 3. Patient to pick up Keflex for continued suppressive therapy at bedtime  F/u in 2 weeks for OB visit, proof of care for trichomoniasis

## 2017-04-15 ENCOUNTER — Telehealth: Payer: Self-pay | Admitting: Women's Health

## 2017-04-15 DIAGNOSIS — O0990 Supervision of high risk pregnancy, unspecified, unspecified trimester: Secondary | ICD-10-CM

## 2017-04-15 DIAGNOSIS — Z8759 Personal history of other complications of pregnancy, childbirth and the puerperium: Secondary | ICD-10-CM | POA: Insufficient documentation

## 2017-04-15 DIAGNOSIS — O365930 Maternal care for other known or suspected poor fetal growth, third trimester, not applicable or unspecified: Secondary | ICD-10-CM

## 2017-04-15 NOTE — Telephone Encounter (Signed)
Called pt, notified of efw u/s results done for s<d. Overall EFW 32% w/ AC 2.4% and excellent dopplers-discussed w/ JVF, feels d/t small stature/very round belly- AC measurement images were not optimal, however we will get weekly bpp/dopp until 32wks then 2x/wk testing thereafter. Scheduled her for Tues 7/10 @ 1130 for u/s then f/u w/ provider after, verbalized understanding.  Cheral MarkerKimberly R. Denae Zulueta, CNM, [facility] 04/15/2017 12:58 PM

## 2017-04-16 ENCOUNTER — Other Ambulatory Visit: Payer: Medicaid Other

## 2017-04-17 LAB — CBC
HEMOGLOBIN: 9.6 g/dL — AB (ref 11.1–15.9)
Hematocrit: 30.3 % — ABNORMAL LOW (ref 34.0–46.6)
MCH: 26.1 pg — AB (ref 26.6–33.0)
MCHC: 31.7 g/dL (ref 31.5–35.7)
MCV: 82 fL (ref 79–97)
PLATELETS: 185 10*3/uL (ref 150–379)
RBC: 3.68 x10E6/uL — ABNORMAL LOW (ref 3.77–5.28)
RDW: 14.9 % (ref 12.3–15.4)
WBC: 11.3 10*3/uL — ABNORMAL HIGH (ref 3.4–10.8)

## 2017-04-17 LAB — HIV ANTIBODY (ROUTINE TESTING W REFLEX): HIV Screen 4th Generation wRfx: NONREACTIVE

## 2017-04-17 LAB — GLUCOSE TOLERANCE, 2 HOURS W/ 1HR
GLUCOSE, 1 HOUR: 139 mg/dL (ref 65–179)
GLUCOSE, FASTING: 66 mg/dL (ref 65–91)
Glucose, 2 hour: 93 mg/dL (ref 65–152)

## 2017-04-17 LAB — RPR: RPR: NONREACTIVE

## 2017-04-17 LAB — ANTIBODY SCREEN: ANTIBODY SCREEN: NEGATIVE

## 2017-04-20 ENCOUNTER — Ambulatory Visit (INDEPENDENT_AMBULATORY_CARE_PROVIDER_SITE_OTHER): Payer: Medicaid Other | Admitting: Obstetrics & Gynecology

## 2017-04-20 ENCOUNTER — Ambulatory Visit (INDEPENDENT_AMBULATORY_CARE_PROVIDER_SITE_OTHER): Payer: Medicaid Other

## 2017-04-20 ENCOUNTER — Encounter: Payer: Self-pay | Admitting: Obstetrics & Gynecology

## 2017-04-20 VITALS — BP 80/60 | HR 80 | Wt 123.0 lb

## 2017-04-20 DIAGNOSIS — O09893 Supervision of other high risk pregnancies, third trimester: Secondary | ICD-10-CM

## 2017-04-20 DIAGNOSIS — O365930 Maternal care for other known or suspected poor fetal growth, third trimester, not applicable or unspecified: Secondary | ICD-10-CM | POA: Diagnosis not present

## 2017-04-20 DIAGNOSIS — Z331 Pregnant state, incidental: Secondary | ICD-10-CM

## 2017-04-20 DIAGNOSIS — Z1389 Encounter for screening for other disorder: Secondary | ICD-10-CM | POA: Diagnosis not present

## 2017-04-20 DIAGNOSIS — O26892 Other specified pregnancy related conditions, second trimester: Secondary | ICD-10-CM

## 2017-04-20 DIAGNOSIS — Z3A29 29 weeks gestation of pregnancy: Secondary | ICD-10-CM | POA: Diagnosis not present

## 2017-04-20 DIAGNOSIS — O360130 Maternal care for anti-D [Rh] antibodies, third trimester, not applicable or unspecified: Secondary | ICD-10-CM

## 2017-04-20 DIAGNOSIS — O0990 Supervision of high risk pregnancy, unspecified, unspecified trimester: Secondary | ICD-10-CM

## 2017-04-20 DIAGNOSIS — Z6791 Unspecified blood type, Rh negative: Secondary | ICD-10-CM

## 2017-04-20 DIAGNOSIS — O0930 Supervision of pregnancy with insufficient antenatal care, unspecified trimester: Secondary | ICD-10-CM

## 2017-04-20 LAB — POCT URINALYSIS DIPSTICK
Glucose, UA: NEGATIVE
KETONES UA: NEGATIVE
Nitrite, UA: NEGATIVE
PROTEIN UA: NEGATIVE

## 2017-04-20 MED ORDER — RHO D IMMUNE GLOBULIN 1500 UNIT/2ML IJ SOSY
300.0000 ug | PREFILLED_SYRINGE | Freq: Once | INTRAMUSCULAR | Status: AC
  Administered 2017-04-20: 300 ug via INTRAMUSCULAR

## 2017-04-20 NOTE — Progress Notes (Signed)
Fetal Surveillance Testing today:  BPP 8/8, with normal Dopplers   High Risk Pregnancy Diagnosis(es):   FGR, Pyelo with sepsis  G1P0000 3675w1d Estimated Date of Delivery: 07/05/17  Blood pressure (!) 80/60, pulse 80, weight 123 lb (55.8 kg), last menstrual period 09/28/2016.  Urinalysis: Negative   HPI: The patient is being seen today for ongoing management of as above. Today she reports no problems   BP weight and urine results all reviewed and noted. Patient reports good fetal movement, denies any bleeding and no rupture of membranes symptoms or regular contractions.  Fundal Height:  28 Fetal Heart rate:  148 Edema:  none  Patient is without complaints other than noted in her HPI. All questions were answered.  All lab and sonogram results have been reviewed. Comments:    Assessment:  1.  Pregnancy at 2275w1d,  Estimated Date of Delivery: 07/05/17 :                          2.  FGR                        3.    Medication(s) Plans:  Keflex nightly prophylactically  Treatment Plan:  Rhogam today, BPP weekly until 32 weeks then twice weekly surveillance  Return in about 1 week (around 04/27/2017) for BPP/sono, HROB. for appointment for high risk OB care  No orders of the defined types were placed in this encounter.  Orders Placed This Encounter  Procedures  . US Fetal BPP W/O Non Stress  . POCT urinalysis dipstick

## 2017-04-20 NOTE — Progress Notes (Signed)
US 29+1 wks,breech,fhr 148 bpm,BPP 8/8,normal ovaries bilat,post pl gr 1,afi 16 cm,RI .61,.65 =38%

## 2017-04-20 NOTE — Addendum Note (Signed)
Addended by: Federico FlakeNES, PEGGY A on: 04/20/2017 01:39 PM   Modules accepted: Orders

## 2017-04-26 ENCOUNTER — Other Ambulatory Visit: Payer: Self-pay | Admitting: Obstetrics & Gynecology

## 2017-04-26 DIAGNOSIS — O365930 Maternal care for other known or suspected poor fetal growth, third trimester, not applicable or unspecified: Secondary | ICD-10-CM

## 2017-04-27 ENCOUNTER — Encounter: Payer: Self-pay | Admitting: Obstetrics & Gynecology

## 2017-04-27 ENCOUNTER — Ambulatory Visit (INDEPENDENT_AMBULATORY_CARE_PROVIDER_SITE_OTHER): Payer: Medicaid Other

## 2017-04-27 ENCOUNTER — Ambulatory Visit (INDEPENDENT_AMBULATORY_CARE_PROVIDER_SITE_OTHER): Payer: Medicaid Other | Admitting: Obstetrics & Gynecology

## 2017-04-27 VITALS — BP 104/60 | HR 79 | Wt 121.5 lb

## 2017-04-27 DIAGNOSIS — O365930 Maternal care for other known or suspected poor fetal growth, third trimester, not applicable or unspecified: Secondary | ICD-10-CM

## 2017-04-27 DIAGNOSIS — Z1389 Encounter for screening for other disorder: Secondary | ICD-10-CM

## 2017-04-27 DIAGNOSIS — O359XX0 Maternal care for (suspected) fetal abnormality and damage, unspecified, not applicable or unspecified: Secondary | ICD-10-CM

## 2017-04-27 DIAGNOSIS — Z3A30 30 weeks gestation of pregnancy: Secondary | ICD-10-CM

## 2017-04-27 DIAGNOSIS — O0990 Supervision of high risk pregnancy, unspecified, unspecified trimester: Secondary | ICD-10-CM

## 2017-04-27 DIAGNOSIS — Z331 Pregnant state, incidental: Secondary | ICD-10-CM

## 2017-04-27 DIAGNOSIS — O0930 Supervision of pregnancy with insufficient antenatal care, unspecified trimester: Secondary | ICD-10-CM

## 2017-04-27 LAB — POCT URINALYSIS DIPSTICK
Blood, UA: NEGATIVE
Glucose, UA: NEGATIVE
Ketones, UA: NEGATIVE
NITRITE UA: NEGATIVE
PROTEIN UA: NEGATIVE

## 2017-04-27 NOTE — Progress Notes (Signed)
Fetal Surveillance Testing today:  BPP 8/8   High Risk Pregnancy Diagnosis(es):   FGR with AC disproportionately small 2.4%  G1P0000 6432w1d Estimated Date of Delivery: 07/05/17  Blood pressure 104/60, pulse 79, weight 121 lb 8 oz (55.1 kg), last menstrual period 09/28/2016.  Urinalysis: Negative   HPI: The patient is being seen today for ongoing management of FGR, AC 2.4%. Today she reports no complaints   BP weight and urine results all reviewed and noted. Patient reports good fetal movement, denies any bleeding and no rupture of membranes symptoms or regular contractions.  Fundal Height:  30 Fetal Heart rate:  145 Edema:  none  Patient is without complaints other than noted in her HPI. All questions were answered.  All lab and sonogram results have been reviewed. Comments:    Assessment:  1.  Pregnancy at 832w1d,  Estimated Date of Delivery: 07/05/17 :                          2.  FGR, AC 2.4%                        3.  Chest lesion seen on sonogram  Medication(s) Plans:  none  Treatment Plan:  Will discuss sonogram findings with MFM For now repeat scan 1 week for fetal surveillance  Return in about 1 week (around 05/04/2017) for BPP/sono, HROB, with Dr Despina HiddenEure. for appointment for high risk OB care  No orders of the defined types were placed in this encounter.  Orders Placed This Encounter  Procedures  . US Fetal BPP W/O Non Stress  . US UA Cord Doppler  . POCT Urinalysis Dipstick

## 2017-04-27 NOTE — Progress Notes (Signed)
US 30+1 wks,cephalic,afi 141 bpm,post pl gr 1,afi 14 cm,bilat adnexa's wnl,RI .58,.65 36%,BPP 8/8, hypoechoic mass left chest 3.2 x 1.6 x 1.4 cm

## 2017-05-04 ENCOUNTER — Ambulatory Visit (INDEPENDENT_AMBULATORY_CARE_PROVIDER_SITE_OTHER): Payer: Medicaid Other

## 2017-05-04 ENCOUNTER — Encounter: Payer: Self-pay | Admitting: Obstetrics & Gynecology

## 2017-05-04 ENCOUNTER — Ambulatory Visit (INDEPENDENT_AMBULATORY_CARE_PROVIDER_SITE_OTHER): Payer: Medicaid Other | Admitting: Obstetrics & Gynecology

## 2017-05-04 VITALS — BP 98/58 | HR 77 | Wt 123.0 lb

## 2017-05-04 DIAGNOSIS — Z3A31 31 weeks gestation of pregnancy: Secondary | ICD-10-CM | POA: Diagnosis not present

## 2017-05-04 DIAGNOSIS — O09893 Supervision of other high risk pregnancies, third trimester: Secondary | ICD-10-CM | POA: Diagnosis not present

## 2017-05-04 DIAGNOSIS — O0930 Supervision of pregnancy with insufficient antenatal care, unspecified trimester: Secondary | ICD-10-CM

## 2017-05-04 DIAGNOSIS — O365930 Maternal care for other known or suspected poor fetal growth, third trimester, not applicable or unspecified: Secondary | ICD-10-CM

## 2017-05-04 DIAGNOSIS — O0990 Supervision of high risk pregnancy, unspecified, unspecified trimester: Secondary | ICD-10-CM

## 2017-05-04 DIAGNOSIS — Z1389 Encounter for screening for other disorder: Secondary | ICD-10-CM

## 2017-05-04 DIAGNOSIS — Z331 Pregnant state, incidental: Secondary | ICD-10-CM

## 2017-05-04 LAB — POCT URINALYSIS DIPSTICK
GLUCOSE UA: NEGATIVE
Ketones, UA: NEGATIVE
Nitrite, UA: NEGATIVE
Protein, UA: NEGATIVE
RBC UA: NEGATIVE

## 2017-05-04 NOTE — Progress Notes (Signed)
Fetal Surveillance Testing today:  BPP 8/8   High Risk Pregnancy Diagnosis(es):   FGR, Pyelo with sepsis this pregnancy  G1P0000 315w1d Estimated Date of Delivery: 07/05/17  Blood pressure (!) 98/58, pulse 77, weight 123 lb (55.8 kg), last menstrual period 09/28/2016.  Urinalysis: Negative   HPI: The patient is being seen today for ongoing management of as above. Today she reports no complaints   BP weight and urine results all reviewed and noted. Patient reports good fetal movement, denies any bleeding and no rupture of membranes symptoms or regular contractions.  Fundal Height:  30 Fetal Heart rate:  144 Edema:  none  Patient is without complaints other than noted in her HPI. All questions were answered.  All lab and sonogram results have been reviewed. Comments:    Assessment:  1.  Pregnancy at 6815w1d,  Estimated Date of Delivery: 07/05/17 :                          2.  FGR, AC 2.4%                        3.  Sepsis due to pyelonephritis on suppression  Medication(s) Plans:  Keflex 500 nightly  Treatment Plan:  Twice weekly surveilllance sonogram alternating with NST beginning next week, delivery 37 weeks or as clinically indicated  Area in chest is the thymus reviewed with Dr Sherrie Georgeecker  Return in about 1 week (around 05/11/2017) for BPP/sono, HROB. for appointment for high risk OB care  No orders of the defined types were placed in this encounter.  Orders Placed This Encounter  Procedures  . US OB Follow Up  . US Fetal BPP W/O Non Stress  . US UA Cord Doppler  . POCT urinalysis dipstick

## 2017-05-04 NOTE — Progress Notes (Signed)
US 31+1 wks,cephalic,fhr 136 bpm,post pl gr 1,normal ovaries bilat,RI .66,.66=68.5%,AFI 10 cm

## 2017-05-11 ENCOUNTER — Ambulatory Visit (INDEPENDENT_AMBULATORY_CARE_PROVIDER_SITE_OTHER): Payer: Medicaid Other | Admitting: Advanced Practice Midwife

## 2017-05-11 ENCOUNTER — Ambulatory Visit (INDEPENDENT_AMBULATORY_CARE_PROVIDER_SITE_OTHER): Payer: Medicaid Other

## 2017-05-11 ENCOUNTER — Encounter: Payer: Self-pay | Admitting: Advanced Practice Midwife

## 2017-05-11 VITALS — BP 100/56 | HR 84 | Wt 125.0 lb

## 2017-05-11 DIAGNOSIS — O98313 Other infections with a predominantly sexual mode of transmission complicating pregnancy, third trimester: Secondary | ICD-10-CM

## 2017-05-11 DIAGNOSIS — Z8619 Personal history of other infectious and parasitic diseases: Secondary | ICD-10-CM

## 2017-05-11 DIAGNOSIS — Z1389 Encounter for screening for other disorder: Secondary | ICD-10-CM | POA: Diagnosis not present

## 2017-05-11 DIAGNOSIS — O0990 Supervision of high risk pregnancy, unspecified, unspecified trimester: Secondary | ICD-10-CM

## 2017-05-11 DIAGNOSIS — Z3A32 32 weeks gestation of pregnancy: Secondary | ICD-10-CM

## 2017-05-11 DIAGNOSIS — O365930 Maternal care for other known or suspected poor fetal growth, third trimester, not applicable or unspecified: Secondary | ICD-10-CM | POA: Diagnosis not present

## 2017-05-11 DIAGNOSIS — O0930 Supervision of pregnancy with insufficient antenatal care, unspecified trimester: Secondary | ICD-10-CM

## 2017-05-11 DIAGNOSIS — Z331 Pregnant state, incidental: Secondary | ICD-10-CM

## 2017-05-11 DIAGNOSIS — Z3403 Encounter for supervision of normal first pregnancy, third trimester: Secondary | ICD-10-CM

## 2017-05-11 LAB — POCT URINALYSIS DIPSTICK
GLUCOSE UA: NEGATIVE
KETONES UA: NEGATIVE
NITRITE UA: NEGATIVE
Protein, UA: NEGATIVE
RBC UA: NEGATIVE

## 2017-05-11 NOTE — Progress Notes (Signed)
US 32+1 wks,cephalic,post pl gr 2,fhr 136 bpm,afi 11 cm,efw 1802 g 38% ac 8.3%,BPP 8/8,RI .69,.53= 63%

## 2017-05-11 NOTE — Progress Notes (Signed)
Fetal Surveillance Testing today:  BPP/dopplers   High Risk Pregnancy Diagnosis(es):   FGR   G1P0000 7491w1d Estimated Date of Delivery: 07/05/17  Blood pressure (!) 100/56, pulse 84, weight 125 lb (56.7 kg), last menstrual period 09/28/2016.  Urinalysis: Negative   HPI: The patient is being seen today for ongoing management of the above. Today she reports no c/o   BP weight and urine results all reviewed and noted. Patient reports good fetal movement, denies any bleeding and no rupture of membranes symptoms or regular contractions.  Patient is without complaints other than noted in her HPI. All questions were answered.  All lab and sonogram results have been reviewed. Comments:  US 32+1 wks,cephalic,post pl gr 2,fhr 136 bpm,afi 11 cm,efw 1802 g 38% ac 8.3%,BPP 8/8,RI .69,.53= 63%  Assessment:  1.  Pregnancy at 5291w1d,  Estimated Date of Delivery: 07/05/17 :                          2.  FGR (low AC)                        3.    Medication(s) Plans:  Keflex 500mg  qhs  Treatment Plan:  Twice weekly surveilllance sonogram alternating with NST, delivery 37 weeks or as clinically indicated   Return for Fridays for NST/HROB and Tuesdays for US/HROB. for appointment for high risk OB care  No orders of the defined types were placed in this encounter.  Orders Placed This Encounter  Procedures  . Trichomonas vaginalis, RNA  . POCT Urinalysis Dipstick

## 2017-05-13 ENCOUNTER — Encounter: Payer: Self-pay | Admitting: Women's Health

## 2017-05-13 DIAGNOSIS — A599 Trichomoniasis, unspecified: Secondary | ICD-10-CM | POA: Insufficient documentation

## 2017-05-13 LAB — TRICHOMONAS VAGINALIS, PROBE AMP: TRICH VAG BY NAA: POSITIVE — AB

## 2017-05-14 ENCOUNTER — Telehealth: Payer: Self-pay | Admitting: *Deleted

## 2017-05-14 ENCOUNTER — Telehealth: Payer: Self-pay | Admitting: Women's Health

## 2017-05-14 ENCOUNTER — Other Ambulatory Visit: Payer: Medicaid Other | Admitting: Obstetrics and Gynecology

## 2017-05-14 DIAGNOSIS — A599 Trichomoniasis, unspecified: Secondary | ICD-10-CM

## 2017-05-14 MED ORDER — METRONIDAZOLE 500 MG PO TABS: 2000.0000 mg | ORAL_TABLET | Freq: Once | ORAL | 0 refills | Status: AC

## 2017-05-14 MED ORDER — METRONIDAZOLE 500 MG PO TABS: 500.0000 mg | ORAL_TABLET | Freq: Two times a day (BID) | ORAL | 0 refills | Status: DC

## 2017-05-14 NOTE — Telephone Encounter (Signed)
Patient states April BattenKim spoke with her earlier.

## 2017-05-14 NOTE — Telephone Encounter (Signed)
Called pt. Notified her trich Etter Sjogrenpoc is still +. This is the 2nd time she's been treated and had + POC. States she did not take all of her medicine, when asked why, states 'I don't know'. Rx metronidazole 2,000mg  po x 1 (is not compliant w/ BID x 7d dosage)-discussed it is very important to take ALL of medicine to get rid of infection. Do not have sex until after POC this time. She is unsure if partner has ever taken his meds. Offered to tx him, pt states she doesn't know- will have to check w/ him- to call us back w/ name/dob/allergies/pharmacy if he wants us to tx, otherwise he needs to go to HD or PCP for tx. Informed if he does not get tx and they have sex, she will just continue getting trichomonas- so very important they both finish all meds, no sex until after POC. Verbalized understanding.  Cheral MarkerKimberly R. Serene Kopf, CNM, [facility]NP-BC 05/14/2017 12:41 PM

## 2017-05-17 ENCOUNTER — Other Ambulatory Visit: Payer: Self-pay | Admitting: Advanced Practice Midwife

## 2017-05-17 DIAGNOSIS — O365932 Maternal care for other known or suspected poor fetal growth, third trimester, fetus 2: Secondary | ICD-10-CM

## 2017-05-18 ENCOUNTER — Encounter: Payer: Medicaid Other | Admitting: Obstetrics & Gynecology

## 2017-05-18 ENCOUNTER — Other Ambulatory Visit: Payer: Medicaid Other

## 2017-05-20 ENCOUNTER — Encounter: Payer: Medicaid Other | Admitting: Obstetrics & Gynecology

## 2017-05-20 ENCOUNTER — Other Ambulatory Visit: Payer: Medicaid Other

## 2017-05-21 ENCOUNTER — Encounter: Payer: Medicaid Other | Admitting: Obstetrics & Gynecology

## 2017-05-21 ENCOUNTER — Other Ambulatory Visit: Payer: Medicaid Other

## 2017-05-22 ENCOUNTER — Encounter (HOSPITAL_COMMUNITY): Payer: Self-pay

## 2017-05-22 ENCOUNTER — Inpatient Hospital Stay (HOSPITAL_COMMUNITY)
Admission: AD | Admit: 2017-05-22 | Discharge: 2017-05-24 | DRG: 781 | Disposition: A | Discharge status: Home / Self Care | Payer: Medicaid Other | Source: Ambulatory Visit | Attending: Family Medicine | Admitting: Family Medicine

## 2017-05-22 DIAGNOSIS — Z880 Allergy status to penicillin: Secondary | ICD-10-CM

## 2017-05-22 DIAGNOSIS — O2303 Infections of kidney in pregnancy, third trimester: Secondary | ICD-10-CM | POA: Diagnosis present

## 2017-05-22 DIAGNOSIS — D6959 Other secondary thrombocytopenia: Secondary | ICD-10-CM | POA: Diagnosis present

## 2017-05-22 DIAGNOSIS — O99113 Other diseases of the blood and blood-forming organs and certain disorders involving the immune mechanism complicating pregnancy, third trimester: Secondary | ICD-10-CM | POA: Diagnosis present

## 2017-05-22 DIAGNOSIS — O365930 Maternal care for other known or suspected poor fetal growth, third trimester, not applicable or unspecified: Secondary | ICD-10-CM | POA: Diagnosis present

## 2017-05-22 DIAGNOSIS — Z3A33 33 weeks gestation of pregnancy: Secondary | ICD-10-CM

## 2017-05-22 DIAGNOSIS — O26893 Other specified pregnancy related conditions, third trimester: Secondary | ICD-10-CM | POA: Diagnosis present

## 2017-05-22 DIAGNOSIS — O2332 Infections of other parts of urinary tract in pregnancy, second trimester: Secondary | ICD-10-CM | POA: Diagnosis not present

## 2017-05-22 DIAGNOSIS — Z6791 Unspecified blood type, Rh negative: Secondary | ICD-10-CM | POA: Diagnosis not present

## 2017-05-22 DIAGNOSIS — B9620 Unspecified Escherichia coli [E. coli] as the cause of diseases classified elsewhere: Secondary | ICD-10-CM | POA: Diagnosis present

## 2017-05-22 DIAGNOSIS — Z8759 Personal history of other complications of pregnancy, childbirth and the puerperium: Secondary | ICD-10-CM | POA: Diagnosis present

## 2017-05-22 DIAGNOSIS — M549 Dorsalgia, unspecified: Secondary | ICD-10-CM | POA: Diagnosis not present

## 2017-05-22 LAB — CBC WITH DIFFERENTIAL/PLATELET
BASOS ABS: 0 10*3/uL (ref 0.0–0.1)
BASOS PCT: 0 %
Eosinophils Absolute: 0 10*3/uL (ref 0.0–0.7)
Eosinophils Relative: 0 %
HEMATOCRIT: 28 % — AB (ref 36.0–46.0)
HEMOGLOBIN: 9.3 g/dL — AB (ref 12.0–15.0)
LYMPHS PCT: 12 %
Lymphs Abs: 1.7 10*3/uL (ref 0.7–4.0)
MCH: 27.7 pg (ref 26.0–34.0)
MCHC: 33.2 g/dL (ref 30.0–36.0)
MCV: 83.3 fL (ref 78.0–100.0)
Monocytes Absolute: 0.4 10*3/uL (ref 0.1–1.0)
Monocytes Relative: 3 %
NEUTROS ABS: 11.9 10*3/uL — AB (ref 1.7–7.7)
NEUTROS PCT: 85 %
Platelets: 152 10*3/uL (ref 150–400)
RBC: 3.36 MIL/uL — AB (ref 3.87–5.11)
RDW: 14.6 % (ref 11.5–15.5)
WBC: 14 10*3/uL — ABNORMAL HIGH (ref 4.0–10.5)

## 2017-05-22 LAB — URINALYSIS, ROUTINE W REFLEX MICROSCOPIC
Bilirubin Urine: NEGATIVE
GLUCOSE, UA: NEGATIVE mg/dL
Ketones, ur: NEGATIVE mg/dL
Nitrite: NEGATIVE
PROTEIN: 100 mg/dL — AB
Specific Gravity, Urine: 1.012 (ref 1.005–1.030)
pH: 7 (ref 5.0–8.0)

## 2017-05-22 LAB — COMPREHENSIVE METABOLIC PANEL
ALBUMIN: 2.9 g/dL — AB (ref 3.5–5.0)
ALK PHOS: 91 U/L (ref 38–126)
ALT: 9 U/L — AB (ref 14–54)
ANION GAP: 6 (ref 5–15)
AST: 13 U/L — ABNORMAL LOW (ref 15–41)
BILIRUBIN TOTAL: 0.4 mg/dL (ref 0.3–1.2)
BUN: 8 mg/dL (ref 6–20)
CALCIUM: 8.3 mg/dL — AB (ref 8.9–10.3)
CO2: 23 mmol/L (ref 22–32)
Chloride: 107 mmol/L (ref 101–111)
Creatinine, Ser: 0.58 mg/dL (ref 0.44–1.00)
GFR calc non Af Amer: >60 mL/min (ref >60)
GLUCOSE: 96 mg/dL (ref 65–99)
Potassium: 3.3 mmol/L — ABNORMAL LOW (ref 3.5–5.1)
Sodium: 136 mmol/L (ref 135–145)
TOTAL PROTEIN: 6.4 g/dL — AB (ref 6.5–8.1)

## 2017-05-22 MED ORDER — OXYCODONE-ACETAMINOPHEN 5-325 MG PO TABS: 1.0000 | ORAL_TABLET | Freq: Four times a day (QID) | ORAL | Status: DC | PRN

## 2017-05-22 MED ORDER — PRENATAL MULTIVITAMIN CH
1.0000 | ORAL_TABLET | Freq: Every day | ORAL | Status: DC
  Administered 2017-05-22 – 2017-05-23 (×2): 1 via ORAL
  Filled 2017-05-22 (×2): qty 1

## 2017-05-22 MED ORDER — CALCIUM CARBONATE ANTACID 500 MG PO CHEW: 2.0000 | CHEWABLE_TABLET | ORAL | Status: DC | PRN

## 2017-05-22 MED ORDER — ACETAMINOPHEN 500 MG PO TABS
1000.0000 mg | ORAL_TABLET | Freq: Four times a day (QID) | ORAL | Status: DC | PRN
  Administered 2017-05-22 – 2017-05-24 (×3): 1000 mg via ORAL
  Filled 2017-05-22 (×3): qty 2

## 2017-05-22 MED ORDER — SODIUM CHLORIDE 0.9 % IV SOLN
INTRAVENOUS | Status: DC
  Administered 2017-05-22 – 2017-05-24 (×6): via INTRAVENOUS

## 2017-05-22 MED ORDER — CEFTRIAXONE SODIUM 2 G IJ SOLR
2.0000 g | INTRAMUSCULAR | Status: DC
  Administered 2017-05-22 – 2017-05-23 (×2): 2 g via INTRAVENOUS
  Filled 2017-05-22 (×3): qty 2

## 2017-05-22 MED ORDER — ZOLPIDEM TARTRATE 5 MG PO TABS: 5.0000 mg | ORAL_TABLET | Freq: Every evening | ORAL | Status: DC | PRN

## 2017-05-22 MED ORDER — DOCUSATE SODIUM 100 MG PO CAPS
100.0000 mg | ORAL_CAPSULE | Freq: Every day | ORAL | Status: DC
  Administered 2017-05-22 – 2017-05-23 (×2): 100 mg via ORAL
  Filled 2017-05-22 (×2): qty 1

## 2017-05-22 NOTE — MAU Note (Signed)
Patient arrived via EMS, c/o abdominal pain, denies vaginal bleeding, no unusual vaginal discharge, positive FM.  Patient unable to supply a urine sample at present for testing.

## 2017-05-22 NOTE — H&P (Signed)
April Young is a 21 y.o. female G1 @33 .5 wks presenting for back pain that started around 0500. Pain is located on right lower back. She describes as achey and constant. She had 2 episodes of N/V this am. No fevers. No dysuria, polyuria, or hematuria. Reports good FM. No VB, LOF, or ctx. Her pregnancy has been complicated by recurrent pyelonephritis, late PNC, IUGR, and persistent trichomonas infection. She was instructed to take Keflex nightly but "forgets sometimes". Evaluation in MAU reveals leukocytosis with left shift and UA that indicates pyelo.  OB History    Gravida Para Term Preterm AB Living   1 0 0 0 0 0   SAB TAB Ectopic Multiple Live Births   0             Past Medical History:  Diagnosis Date  . Abdominal pain affecting pregnancy 03/10/2017  . Candida vaginitis 03/10/2017  . Hx of trichomoniasis 11/2016  . Rh negative status during pregnancy   . Sepsis due to Escherichia coli (HCC) 03/2017  . Urinary tract infection 03/10/2017   Past Surgical History:  Procedure Laterality Date  . SKIN SURGERY     abcess   Family History: family history includes COPD in her maternal grandmother. Social History:  reports that she has never smoked. She has never used smokeless tobacco. She reports that she does not drink alcohol or use drugs.     Maternal Diabetes: No Genetic Screening: Normal Maternal Ultrasounds/Referrals: Abnormal:  Findings:   IUGR Fetal Ultrasounds or other Referrals:  None Maternal Substance Abuse:  No Significant Maternal Medications:  None Significant Maternal Lab Results:  None Other Comments:  None  Review of Systems  Constitutional: Negative for chills and fever.  Gastrointestinal: Negative for abdominal pain.  Genitourinary: Positive for flank pain. Negative for dysuria, frequency, hematuria and urgency.  Musculoskeletal: Positive for back pain.   Maternal Medical History:  Fetal activity: Perceived fetal activity is normal.   Last perceived  fetal movement was within the past hour.    Prenatal complications: IUGR.   Prenatal Complications - Diabetes: none.      Blood pressure (!) 100/54, pulse 84, temperature 98.4 F (36.9 C), resp. rate 16, last menstrual period 09/28/2016.   Fetal Exam Fetal Monitor Review: Mode: ultrasound.   Baseline rate: 130.  Variability: moderate (6-25 bpm).   Pattern: accelerations present and no decelerations.    Fetal State Assessment: Category I - tracings are normal.     Physical Exam  Nursing note and vitals reviewed. Constitutional: She appears well-developed and well-nourished. No distress.  HENT:  Head: Normocephalic and atraumatic.  Neck: Normal range of motion.  Cardiovascular: Normal rate.   Respiratory: Effort normal. No respiratory distress.  GI: Soft. She exhibits no distension. There is no tenderness. There is CVA tenderness (right).  Musculoskeletal: Normal range of motion.       Cervical back: Normal.       Thoracic back: She exhibits tenderness (right).       Lumbar back: She exhibits tenderness (right).    Prenatal labs: ABO, Rh: --/--/A NEG (06/29 1217) Antibody: Negative (07/06 0901) Rubella: 22.40 (04/10 1447) RPR: Non Reactive (07/06 0901)  HBsAg: Negative (04/10 1447)  HIV:    GBS:     Assessment/Plan: 33.[redacted] weeks gestation Pyelonephritis Admit to Ante Rocephin [facility]ia prn Management per MD Dr. Shawnie PonsPratt aware of admit    April Young, CNM 05/22/2017, 9:38 AM

## 2017-05-22 NOTE — Progress Notes (Signed)
Patient taken off monitor to void, patient came out of bathroom stating she could not, explained will need to obtain a cath urine if unable to void, patient states she will call me when able to void.

## 2017-05-22 NOTE — Progress Notes (Signed)
Patient vomited and urinated all over bed, changed gown and bedding and applied large pad and panties on patient.

## 2017-05-23 DIAGNOSIS — Z3A33 33 weeks gestation of pregnancy: Secondary | ICD-10-CM

## 2017-05-23 DIAGNOSIS — O2303 Infections of kidney in pregnancy, third trimester: Principal | ICD-10-CM

## 2017-05-23 NOTE — Progress Notes (Signed)
Patient ID: April ByarsCierra N Young, female   DOB: January 16, 1996, 21 y.o.   MRN: [id number] FACULTY PRACTICE ANTEPARTUM(COMPREHENSIVE) NOTE  April Young is a 21 y.o. G1P0000 at 7362w6d by early ultrasound who is admitted for pyelonephritis.   Fetal presentation is unsure. Length of Stay:  1  Days  Subjective: Feels better. Back pain is improved. Patient reports the fetal movement as active. Patient reports uterine contraction  activity as none. Patient reports  vaginal bleeding as none. Patient describes fluid per vagina as None.  Vitals:  Blood pressure 102/60, pulse 99, temperature 99.1 F (37.3 C), temperature source Oral, resp. rate 18, height 5\' 8"  (1.727 m), weight 123 lb (55.8 kg), last menstrual period 09/28/2016, SpO2 99 %. Physical Examination:  General appearance - alert, well appearing, and in no distress Chest - normal effort Abdomen - gravid, NT Fundal Height:  size equals dates Extremities: Homans sign is negative, no sign of DVT  Membranes:intact  Fetal Monitoring:  Baseline: 155 bpm, Variability: Good {> 6 bpm), Accelerations: Reactive and Decelerations: Absent  Labs:  Results for orders placed or performed during the hospital encounter of 05/22/17 (from the past 24 hour(s))  Urinalysis, Routine w reflex microscopic   Collection Time: 05/22/17  8:31 AM  Result Value Ref Range   Color, Urine YELLOW YELLOW   APPearance CLOUDY (A) CLEAR   Specific Gravity, Urine 1.012 1.005 - 1.030   pH 7.0 5.0 - 8.0   Glucose, UA NEGATIVE NEGATIVE mg/dL   Hgb urine dipstick MODERATE (A) NEGATIVE   Bilirubin Urine NEGATIVE NEGATIVE   Ketones, ur NEGATIVE NEGATIVE mg/dL   Protein, ur 409100 (A) NEGATIVE mg/dL   Nitrite NEGATIVE NEGATIVE   Leukocytes, UA LARGE (A) NEGATIVE   RBC / HPF TOO NUMEROUS TO COUNT 0 - 5 RBC/hpf   WBC, UA TOO NUMEROUS TO COUNT 0 - 5 WBC/hpf   Bacteria, UA RARE (A) NONE SEEN   Squamous Epithelial / LPF 0-5 (A) NONE SEEN   WBC Clumps PRESENT    Mucous PRESENT     Budding Yeast PRESENT    Non Squamous Epithelial 0-5 (A) NONE SEEN  CBC with Differential/Platelet   Collection Time: 05/22/17  8:36 AM  Result Value Ref Range   WBC 14.0 (H) 4.0 - 10.5 K/uL   RBC 3.36 (L) 3.87 - 5.11 MIL/uL   Hemoglobin 9.3 (L) 12.0 - 15.0 g/dL   HCT 81.128.0 (L) 91.436.0 - 78.246.0 %   MCV 83.3 78.0 - 100.0 fL   MCH 27.7 26.0 - 34.0 pg   MCHC 33.2 30.0 - 36.0 g/dL   RDW 95.614.6 21.311.5 - 08.615.5 %   Platelets 152 150 - 400 K/uL   Neutrophils Relative % 85 %   Neutro Abs 11.9 (H) 1.7 - 7.7 K/uL   Lymphocytes Relative 12 %   Lymphs Abs 1.7 0.7 - 4.0 K/uL   Monocytes Relative 3 %   Monocytes Absolute 0.4 0.1 - 1.0 K/uL   Eosinophils Relative 0 %   Eosinophils Absolute 0.0 0.0 - 0.7 K/uL   Basophils Relative 0 %   Basophils Absolute 0.0 0.0 - 0.1 K/uL  Comprehensive metabolic panel   Collection Time: 05/22/17  8:36 AM  Result Value Ref Range   Sodium 136 135 - 145 mmol/L   Potassium 3.3 (L) 3.5 - 5.1 mmol/L   Chloride 107 101 - 111 mmol/L   CO2 23 22 - 32 mmol/L   Glucose, Bld 96 65 - 99 mg/dL   BUN 8 6 -  20 mg/dL   Creatinine, Ser 1.61 0.44 - 1.00 mg/dL   Calcium 8.3 (L) 8.9 - 10.3 mg/dL   Total Protein 6.4 (L) 6.5 - 8.1 g/dL   Albumin 2.9 (L) 3.5 - 5.0 g/dL   AST 13 (L) 15 - 41 U/L   ALT 9 (L) 14 - 54 U/L   Alkaline Phosphatase 91 38 - 126 U/L   Total Bilirubin 0.4 0.3 - 1.2 mg/dL   GFR calc non Af Amer >60 >60 mL/min   GFR calc Af Amer >60 >60 mL/min   Anion gap 6 5 - 15  Type and screen [facility]   Collection Time: 05/22/17  9:48 AM  Result Value Ref Range   ABO/RH(D) A NEG    Antibody Screen POS    Sample Expiration 05/25/2017    Antibody Identification PASSIVELY ACQUIRED ANTI-D    DAT, IgG NEG    Unit Number [id number]    Blood Component Type RED CELLS,LR    Unit division 00    Status of Unit ALLOCATED    Transfusion Status OK TO TRANSFUSE    Crossmatch Result COMPATIBLE    Unit Number [id number]    Blood Component Type RED  CELLS,LR    Unit division 00    Status of Unit ALLOCATED    Transfusion Status OK TO TRANSFUSE    Crossmatch Result COMPATIBLE   BPAM RBC   Collection Time: 05/22/17  9:48 AM  Result Value Ref Range   Blood Product Unit Number [id number]    Unit Type and Rh 0600    Blood Product Expiration Date [id number]    Blood Product Unit Number [id number]    Unit Type and Rh 0600    Blood Product Expiration Date [id number]    Medications:  Scheduled . docusate sodium  100 mg Oral Daily  . prenatal multivitamin  1 tablet Oral Q1200   I have reviewed the patient's current medications.  ASSESSMENT: Principal Problem:   Pyelonephritis complicating pregnancy, antepartum, third trimester Active Problems:   IUGR (intrauterine growth restriction) affecting care of mother   PLAN: Continue inpatient x 48 hours Await urine culture results Continue Rocephin Fetal testing is reassuring.  Reva Bores, MD 05/23/2017,7:43 AM

## 2017-05-23 NOTE — Plan of Care (Signed)
Problem: Pain Managment: Goal: General experience of comfort will improve Outcome: Progressing Patient afebrile this morning and denies right flank pain

## 2017-05-24 LAB — CULTURE, OB URINE

## 2017-05-24 MED ORDER — CEPHALEXIN 500 MG PO CAPS: 500.0000 mg | ORAL_CAPSULE | Freq: Two times a day (BID) | ORAL | 2 refills | Status: DC

## 2017-05-24 MED ORDER — CEPHALEXIN 500 MG PO CAPS: 500.0000 mg | ORAL_CAPSULE | Freq: Four times a day (QID) | ORAL | 0 refills | Status: DC

## 2017-05-24 NOTE — Discharge Instructions (Signed)
Intrauterine Growth Restriction Intrauterine growth restriction (IUGR) is when your baby is not growing normally during your pregnancy. A baby with IUGR is smaller than it should be and may weigh less than normal at birth. IUGR can result if there is a problem with the organ that supplies your baby with oxygen and nutrition (placenta). Usually, there is no way to prevent this type of problem. What are the causes? The most common cause of IUGR is a problem with the placenta or umbilical cord that causes your developing baby to get less oxygen or nutrition than needed. Other causes include:  Poor maternal nutrition.  Chemicals found in substances such as cigarettes, alcohol, and some illegal drugs.  Some prescription medicines.  Congenital defects.  Genetic disorders.  An infection.  Carrying more than one baby, such as twins or triplets (multiple gestations).  What increases the risk? This condition is more likely to develop in women who:  Are over the age of 20 at the time of delivery (advanced maternal age).  Have medical conditions such as high blood pressure, diabetes, heart or kidney disease, anemia, or conditions that increase the risk for blood clotting.  Live at a very high altitude during pregnancy.  Have a personal history or family history of IUGR.  Take medicines during pregnancy that are linked with congenital disabilities.  Have a personal or family history of a genetic disorder.  Come into contact with infected cat feces (toxoplasmosis).  Come into contact with chickenpox (varicella) or [place] measles (rubella).  Have or are at risk of getting an infectious disease such as syphilis, HIV, or herpes.  Eat poorly during their pregnancy.  Weigh less than 100 pounds.  Have a family history of multiple gestations.  Have had infertility treatments.  Use tobacco, illegal drugs, or drink alcohol during pregnancy.  What are the signs or symptoms? IUGR does not  cause many symptoms. You might notice that your baby does not move or kick very often. Also, your belly may not be as big as expected for the stage of your pregnancy. How is this diagnosed? This condition is diagnosed with physical and prenatal exams. Your health care provider will measure the size of your baby inside your womb during a routine screening exam using sound waves (ultrasound). Your health care provider will compare the size of your baby to the size of other babies at the same stage of development (gestational age). Your health care provider will diagnose IUGR if your baby is smaller than 90 percent of all other babies at the same gestational age. You may also have tests to find the cause of IUGR. These can include:  Having fluid removed from your womb to check for signs of infection or a congenital disability (amniocentesis).  A series of tests to monitor your baby's health (fetal monitoring).  How is this treated? In most cases, treatment for this condition focuses on stopping the cause of your babys small growth. Your health care providers will monitor your pregnancy closely and help you manage your pregnancy. If this condition is caused by a placenta problem and the baby is not getting enough blood, treatment may include:  Medicine to start labor and deliver your baby early (induction).  Cesarean delivery. In this procedure, your baby is delivered through a cut (incision) in the abdomen and womb (uterus).  Follow these instructions at home:  Make sure you are eating enough calories and gaining enough weight.  Eat a balanced diet. Work with a Dealernutrition specialist (  dietitian), if needed.  Rest as needed. Try to get at least eight hours of sleep every night.  Do not drink alcohol.  Do not use illegal drugs.  Do not use any tobacco products, including cigarettes, chewing tobacco, or e-cigarettes. If you need help quitting, ask your health care provider.  Talk to your  health care provider about steps you can take to avoid infections.  Take medicines, vitamins, and mineral supplements only as directed by your health care provider. Make sure that your health care provider knows about all of the prescribed or over-the-counter medicines, supplements, vitamins, eye drops, and creams that you are using.  Keep all follow-up visits as directed by your health care provider. This is important. Contact a health care provider if:  Your baby is not moving as often as before. This information is not intended to replace advice given to you by your health care provider. Make sure you discuss any questions you have with your health care provider. Document Released: 07/07/2008 Document Revised: 03/05/2016 Document Reviewed: 09/24/2014 Elsevier Interactive Patient Education  2018 [employer] Pregnancy and Urinary Tract Infection What is a urinary tract infection? A urinary tract infection (UTI) is an infection of any part of the urinary tract. This includes the kidneys, the tubes that connect your kidneys to your bladder (ureters), the bladder, and the tube that carries urine out of your body (urethra). These organs make, store, and get rid of urine in the body. A UTI can be a bladder infection (cystitis) or a kidney infection (pyelonephritis). This infection may be caused by fungi, viruses, and bacteria. Bacteria are the most common cause of UTIs. You are more likely to develop a UTI during pregnancy because:  The physical and hormonal changes your body goes through can make it easier for bacteria to get into your urinary tract.  Your growing baby puts pressure on your uterus and can affect urine flow.  Does a UTI place my baby at risk? An untreated UTI during pregnancy could lead to a kidney infection, which can cause health problems that could affect your baby. Possible complications of an untreated UTI include:  Having your baby before 37 weeks of pregnancy  (premature).  Having a baby with a low birth weight.  Developing high blood pressure during pregnancy (preeclampsia).  What are the symptoms of a UTI? Symptoms of a UTI include:  Fever.  Frequent urination or passing small amounts of urine frequently.  Needing to urinate urgently.  Pain or a burning sensation with urination.  Urine that smells bad or unusual.  Cloudy urine.  Pain in the lower abdomen or back.  Trouble urinating.  Blood in the urine.  Vomiting or being less hungry than normal.  Diarrhea or abdominal pain.  Vaginal discharge.  What are the treatment options for a UTI during pregnancy? Treatment for this condition may include:  Antibiotic medicines that are safe to take during pregnancy.  Other medicines to treat less common causes of UTI.  How can I prevent a UTI?  To prevent a UTI:  Go to the bathroom as soon as you feel the need.  Always wipe from front to back.  Wash your genital area with soap and warm water daily.  Empty your bladder before and after sex.  Wear cotton underwear.  Limit your intake of high sugar foods or drinks, such as regular soda, juice, and sweets.  Drink 6-8 glasses of water daily.  Do not wear tight-fitting pants.  Do not douche or  use deodorant sprays.  Do not drink alcohol, caffeine, or carbonated drinks. These can irritate the bladder.  Contact a health care provider if:  Your symptoms do not improve or get worse.  You have a fever after two days of treatment.  You have a rash.  You have abnormal vaginal discharge.  You have back or side pain.  You have chills.  You have nausea and vomiting. Get help right away if: Seek immediate medical care if you are pregnant and:  You feel contractions in your uterus.  You have lower belly pain.  You have a gush of fluid from your vagina.  You have blood in your urine.  You are vomiting and cannot keep down any medicines or water.  This  information is not intended to replace advice given to you by your health care provider. Make sure you discuss any questions you have with your health care provider. Document Released: 01/23/2011 Document Revised: 09/11/2016 Document Reviewed: 08/19/2015 Elsevier Interactive Patient Education  2017 [employer].

## 2017-05-24 NOTE — Progress Notes (Signed)
Patient ID: April ByarsCierra N Young, female   DOB: September 20, 1996, 21 y.o.   MRN: [id number] FACULTY PRACTICE ANTEPARTUM(COMPREHENSIVE) NOTE  April Young is a 21 y.o. G1P0000 with Estimated Date of Delivery: 07/05/17    1461w0d  who is admitted for recurrent pyelonephritis.  Pt has not been compliant with her outpatient care   Fetal presentation is cephalic. Length of Stay:  2  Days  Date of admission:05/22/2017  Subjective: No complaints Patient reports the fetal movement as active. Patient reports uterine contraction  activity as none. Patient reports  vaginal bleeding as none. Patient describes fluid per vagina as None.  Vitals:  Blood pressure 101/67, pulse (!) 107, temperature 98.8 F (37.1 C), temperature source Oral, resp. rate 18, height 5\' 8"  (1.727 m), weight 123 lb (55.8 kg), last menstrual period 09/28/2016, SpO2 97 %. Vitals:   05/23/17 1151 05/23/17 1549 05/23/17 1932 05/24/17 0000  BP:  111/68  101/67  Pulse:  92  (!) 107  Resp:  18 15 18   Temp:  99.3 F (37.4 C) 98.1 F (36.7 C) 98.8 F (37.1 C)  TempSrc:  Oral Oral Oral  SpO2: 100% 100% 100% 97%  Weight:      Height:       Physical Examination:  General appearance - alert, well appearing, and in no distress Abdomen - soft, nontender, nondistended, no masses or organomegaly Back exam - no CVAT appreciated  Extremities: extremities normal, atraumatic, no cyanosis or edema  Membranes:intact  Fetal Monitoring:  Reactive NST     Labs:  No results found for this or any previous visit (from the past 24 hour(s)).  Imaging Studies:      Medications:  Scheduled . docusate sodium  100 mg Oral Daily  . prenatal multivitamin  1 tablet Oral Q1200   I have reviewed the patient's current medications.  ASSESSMENT: G1P0000 8361w0d Estimated Date of Delivery: 07/05/17  Patient Active Problem List   Diagnosis Date Noted  . Pyelonephritis affecting pregnancy in third trimester 05/22/2017  . Trichomonas infection 05/13/2017   . IUGR (intrauterine growth restriction) affecting care of mother 04/15/2017  . Pyelonephritis complicating pregnancy, antepartum, third trimester 04/09/2017  . Gestational thrombocytopenia (HCC) 03/16/2017  . Anemia affecting pregnancy in second trimester 03/15/2017  . Bacteremia due to Escherichia coli 03/15/2017  . Pyelonephritis 03/13/2017  . Supervision of high risk pregnancy, antepartum 01/19/2017  . Late prenatal care 01/19/2017    PLAN: Urine culture has grown E coli, sensitivities pending, continue rocephin in the meantime for at least 72 hours IV  Pt [facility] the [facility] she has been called each time, for fetal monitoring as well as, taking her keflex suppression daily which she has not been doing.  She voices understanding  EURE,LUTHER H 05/24/2017,8:05 AM

## 2017-05-24 NOTE — Discharge Summary (Signed)
Antenatal Physician Discharge Summary  Patient ID: April Young MRN: [id number] DOB/AGE: 1996/06/06 21 y.o.  Admit date: 05/22/2017 Discharge date: 05/24/2017  Admission Diagnoses: Principal Problem:   Pyelonephritis complicating pregnancy, antepartum, third trimester Active Problems:   IUGR (intrauterine growth restriction) affecting care of mother    Discharge Diagnoses: Same  Prenatal Procedures: NST and IV antibiotics  Consults: None  Hospital Course:  This is a 21 y.o. G1P0000 with IUP at [redacted]w[redacted]d admitted for recurrent pyelonephritis. She was admitted with painful urination, CVA tenderness and elevated WBC. She remained afebrile x 48 hours. Urine culture collected and placed on IV Rocephin. Her urine culture grew E.coli which was sensitive to Keflex. Once this returned, it was decided she had reached the maximum benefit from her stay. She was deemed stable for discharge to home with outpatient follow up.  Discharge Exam: Temp:  [97.8 F (36.6 C)-99.3 F (37.4 C)] 98.2 F (36.8 C) (08/13 0800) Pulse Rate:  [84-107] 84 (08/13 0800) Resp:  [15-18] 16 (08/13 0800) BP: (86-111)/(51-68) 86/51 (08/13 0800) SpO2:  [97 %-100 %] 99 % (08/13 0800) Physical Examination: CONSTITUTIONAL: Well-developed, well-nourished female in no acute distress.  HENT:  Normocephalic, atraumatic, External right and left ear normal.  EYES: Conjunctivae and EOM are normal. No scleral icterus.  NECK: Normal range of motion, supple, no masses SKIN: Skin is warm and dry. No rash noted. Not diaphoretic. No erythema. No pallor. NEUROLGIC: Alert and oriented to person, place, and time.  PSYCHIATRIC: Normal mood and affect. Normal behavior.  CARDIOVASCULAR: Normal heart rate noted, regular rhythm RESPIRATORY: Effort and breath sounds normal MUSCULOSKELETAL: Normal range of motion. No edema and no tenderness.  ABDOMEN: Soft, nontender, nondistended, gravid.  Fetal monitoring: FHR: 155 bpm,  Variability: moderate, Accelerations: Present, Decelerations: Absent  Uterine activity: 0 contractions per hour  Significant Diagnostic Studies:  Results for orders placed or performed during the hospital encounter of 05/22/17 (from the past 168 hour(s))  Urinalysis, Routine w reflex microscopic   Collection Time: 05/22/17  8:31 AM  Result Value Ref Range   Color, Urine YELLOW YELLOW   APPearance CLOUDY (A) CLEAR   Specific Gravity, Urine 1.012 1.005 - 1.030   pH 7.0 5.0 - 8.0   Glucose, UA NEGATIVE NEGATIVE mg/dL   Hgb urine dipstick MODERATE (A) NEGATIVE   Bilirubin Urine NEGATIVE NEGATIVE   Ketones, ur NEGATIVE NEGATIVE mg/dL   Protein, ur 409 (A) NEGATIVE mg/dL   Nitrite NEGATIVE NEGATIVE   Leukocytes, UA LARGE (A) NEGATIVE   RBC / HPF TOO NUMEROUS TO COUNT 0 - 5 RBC/hpf   WBC, UA TOO NUMEROUS TO COUNT 0 - 5 WBC/hpf   Bacteria, UA RARE (A) NONE SEEN   Squamous Epithelial / LPF 0-5 (A) NONE SEEN   WBC Clumps PRESENT    Mucous PRESENT    Budding Yeast PRESENT    Non Squamous Epithelial 0-5 (A) NONE SEEN  CBC with Differential/Platelet   Collection Time: 05/22/17  8:36 AM  Result Value Ref Range   WBC 14.0 (H) 4.0 - 10.5 K/uL   RBC 3.36 (L) 3.87 - 5.11 MIL/uL   Hemoglobin 9.3 (L) 12.0 - 15.0 g/dL   HCT 81.1 (L) 91.4 - 78.2 %   MCV 83.3 78.0 - 100.0 fL   MCH 27.7 26.0 - 34.0 pg   MCHC 33.2 30.0 - 36.0 g/dL   RDW 95.6 21.3 - 08.6 %   Platelets 152 150 - 400 K/uL   Neutrophils Relative % 85 %   Neutro  Abs 11.9 (H) 1.7 - 7.7 K/uL   Lymphocytes Relative 12 %   Lymphs Abs 1.7 0.7 - 4.0 K/uL   Monocytes Relative 3 %   Monocytes Absolute 0.4 0.1 - 1.0 K/uL   Eosinophils Relative 0 %   Eosinophils Absolute 0.0 0.0 - 0.7 K/uL   Basophils Relative 0 %   Basophils Absolute 0.0 0.0 - 0.1 K/uL  Comprehensive metabolic panel   Collection Time: 05/22/17  8:36 AM  Result Value Ref Range   Sodium 136 135 - 145 mmol/L   Potassium 3.3 (L) 3.5 - 5.1 mmol/L   Chloride 107 101 - 111  mmol/L   CO2 23 22 - 32 mmol/L   Glucose, Bld 96 65 - 99 mg/dL   BUN 8 6 - 20 mg/dL   Creatinine, Ser 1.61 0.44 - 1.00 mg/dL   Calcium 8.3 (L) 8.9 - 10.3 mg/dL   Total Protein 6.4 (L) 6.5 - 8.1 g/dL   Albumin 2.9 (L) 3.5 - 5.0 g/dL   AST 13 (L) 15 - 41 U/L   ALT 9 (L) 14 - 54 U/L   Alkaline Phosphatase 91 38 - 126 U/L   Total Bilirubin 0.4 0.3 - 1.2 mg/dL   GFR calc non Af Amer >60 >60 mL/min   GFR calc Af Amer >60 >60 mL/min   Anion gap 6 5 - 15  Culture, OB Urine   Collection Time: 05/22/17  9:05 AM  Result Value Ref Range   Specimen Description OB CLEAN CATCH    Special Requests NONE    Culture (A)     >=100,000 COLONIES/mL ESCHERICHIA COLI NO GROUP B STREP (S.AGALACTIAE) ISOLATED Performed at [facility], [address] [place], [place] [address]    Report Status 05/24/2017 FINAL    Organism ID, Bacteria ESCHERICHIA COLI (A)       Susceptibility   Escherichia coli - MIC*    AMPICILLIN >=32 RESISTANT Resistant     CEFAZOLIN <=4 SENSITIVE Sensitive     CEFEPIME <=1 SENSITIVE Sensitive     CEFTAZIDIME <=1 SENSITIVE Sensitive     CEFTRIAXONE <=1 SENSITIVE Sensitive     CIPROFLOXACIN <=0.25 SENSITIVE Sensitive     GENTAMICIN <=1 SENSITIVE Sensitive     IMIPENEM <=0.25 SENSITIVE Sensitive     TRIMETH/SULFA <=20 SENSITIVE Sensitive     AMPICILLIN/SULBACTAM 16 INTERMEDIATE Intermediate     PIP/TAZO <=4 SENSITIVE Sensitive     Extended ESBL NEGATIVE Sensitive     * >=100,000 COLONIES/mL ESCHERICHIA COLI  Type and screen [facility]   Collection Time: 05/22/17  9:48 AM  Result Value Ref Range   ABO/RH(D) A NEG    Antibody Screen POS    Sample Expiration 05/25/2017    Antibody Identification PASSIVELY ACQUIRED ANTI-D    DAT, IgG NEG    Unit Number [id number]    Blood Component Type RED CELLS,LR    Unit division 00    Status of Unit ALLOCATED    Transfusion Status OK TO TRANSFUSE    Crossmatch Result COMPATIBLE    Unit Number [id number]     Blood Component Type RED CELLS,LR    Unit division 00    Status of Unit ALLOCATED    Transfusion Status OK TO TRANSFUSE    Crossmatch Result COMPATIBLE   BPAM RBC   Collection Time: 05/22/17  9:48 AM  Result Value Ref Range   Blood Product Unit Number [id number]    Unit Type and Rh 0600  Blood Product Expiration Date [id number]    Blood Product Unit Number [id number]    Unit Type and Rh 0600    Blood Product Expiration Date [id number]     Discharge Condition: Stable  Disposition: 01-Home or Self Care   Discharge Instructions    Discharge activity:  No Restrictions    Complete by:  As directed    Discharge diet:  No restrictions    Complete by:  As directed    Fetal Kick Count:  Lie on our left side for one hour after a meal, and count the number of times your baby kicks.  If it is less than 5 times, get up, move around and drink some juice.  Repeat the test 30 minutes later.  If it is still less than 5 kicks in an hour, notify your doctor.    Complete by:  As directed    Notify physician for a general feeling that "something is not right"    Complete by:  As directed    Notify physician for increase or change in vaginal discharge    Complete by:  As directed    Notify physician for intestinal cramps, with or without diarrhea, sometimes described as "gas pain"    Complete by:  As directed    Notify physician for leaking of fluid    Complete by:  As directed    Notify physician for low, dull backache, unrelieved by heat or Tylenol    Complete by:  As directed    Notify physician for menstrual like cramps    Complete by:  As directed    Notify physician for pelvic pressure    Complete by:  As directed    Notify physician for uterine contractions.  These may be painless and feel like the uterus is tightening or the baby is  "balling up"    Complete by:  As directed    Notify physician for vaginal bleeding    Complete by:  As directed    PRETERM LABOR:   Includes any of the follwing symptoms that occur between 20 - [redacted] weeks gestation.  If these symptoms are not stopped, preterm labor can result in preterm delivery, placing your baby at risk    Complete by:  As directed    Sexual Activity:      Complete by:  As directed    Please be sure to empty your bladder after sex     Allergies as of 05/24/2017      Reactions   Penicillins Rash, Other (See Comments)   Has patient had a PCN reaction causing immediate rash, facial/tongue/throat swelling, SOB or lightheadedness with hypotension: No Has patient had a PCN reaction causing severe rash involving mucus membranes or skin necrosis: No Has patient had a PCN reaction that required hospitalization: No Has patient had a PCN reaction occurring within the last 10 years: No If all of the above answers are "NO", then may proceed with Cephalosporin use.      Medication List    TAKE these medications   cephALEXin 500 MG capsule Commonly known as:  KEFLEX Take 1 capsule (500 mg total) by mouth 4 (four) times daily. What changed:  You were already taking a medication with the same name, and this prescription was added. Make sure you understand how and when to take each.   cephALEXin 500 MG capsule Commonly known as:  KEFLEX Take 1 capsule (500 mg total) by mouth 2 (two) times daily. What changed:  when  to take this   PRENATAL GUMMIES/DHA & FA 0.4-32.5 MG Chew Chew 2 each by mouth daily.      Follow-up Information    [facility] Follow up in 1 week(s).   Specialty:  Obstetrics and Gynecology Why:  They will call you with appointment--if you have not heard from them by tomorrow--call the office Contact information: [address] [place] [phone]          Signed: Reva Boresanya S Alhaji Mcneal M.D. 05/24/2017, 9:33 AM

## 2017-05-26 LAB — TYPE AND SCREEN
ABO/RH(D): A NEG
ANTIBODY SCREEN: POSITIVE
DAT, IgG: NEGATIVE
UNIT DIVISION: 0
Unit division: 0

## 2017-05-26 LAB — BPAM RBC
BLOOD PRODUCT EXPIRATION DATE: 201809142359
BLOOD PRODUCT EXPIRATION DATE: 201809142359
UNIT TYPE AND RH: 600
Unit Type and Rh: 600

## 2017-05-27 ENCOUNTER — Ambulatory Visit (INDEPENDENT_AMBULATORY_CARE_PROVIDER_SITE_OTHER): Payer: Medicaid Other

## 2017-05-27 ENCOUNTER — Ambulatory Visit (INDEPENDENT_AMBULATORY_CARE_PROVIDER_SITE_OTHER): Payer: Medicaid Other | Admitting: Advanced Practice Midwife

## 2017-05-27 ENCOUNTER — Encounter: Payer: Self-pay | Admitting: Advanced Practice Midwife

## 2017-05-27 VITALS — BP 110/70 | HR 68 | Wt 130.5 lb

## 2017-05-27 DIAGNOSIS — O0933 Supervision of pregnancy with insufficient antenatal care, third trimester: Secondary | ICD-10-CM

## 2017-05-27 DIAGNOSIS — Z1389 Encounter for screening for other disorder: Secondary | ICD-10-CM

## 2017-05-27 DIAGNOSIS — Z3A34 34 weeks gestation of pregnancy: Secondary | ICD-10-CM | POA: Diagnosis not present

## 2017-05-27 DIAGNOSIS — O365930 Maternal care for other known or suspected poor fetal growth, third trimester, not applicable or unspecified: Secondary | ICD-10-CM

## 2017-05-27 DIAGNOSIS — O0990 Supervision of high risk pregnancy, unspecified, unspecified trimester: Secondary | ICD-10-CM

## 2017-05-27 DIAGNOSIS — O98313 Other infections with a predominantly sexual mode of transmission complicating pregnancy, third trimester: Secondary | ICD-10-CM

## 2017-05-27 DIAGNOSIS — O09893 Supervision of other high risk pregnancies, third trimester: Secondary | ICD-10-CM | POA: Diagnosis not present

## 2017-05-27 DIAGNOSIS — A599 Trichomoniasis, unspecified: Secondary | ICD-10-CM

## 2017-05-27 DIAGNOSIS — Z331 Pregnant state, incidental: Secondary | ICD-10-CM

## 2017-05-27 DIAGNOSIS — O365932 Maternal care for other known or suspected poor fetal growth, third trimester, fetus 2: Secondary | ICD-10-CM

## 2017-05-27 LAB — POCT URINALYSIS DIPSTICK
GLUCOSE UA: NEGATIVE
Ketones, UA: NEGATIVE
NITRITE UA: NEGATIVE
Protein, UA: NEGATIVE

## 2017-05-27 MED ORDER — ONDANSETRON HCL 4 MG PO TABS: 4.0000 mg | ORAL_TABLET | Freq: Three times a day (TID) | ORAL | 1 refills | Status: DC | PRN

## 2017-05-27 NOTE — Progress Notes (Signed)
Fetal Surveillance Testing today:  US   High Risk Pregnancy Diagnosis(es):   FGR  G1P0000 3750w3d Estimated Date of Delivery: 07/05/17  Blood pressure 110/70, pulse 68, weight 130 lb 8 oz (59.2 kg), last menstrual period 09/28/2016.  Urinalysis: Negative   HPI: The patient is being seen today for ongoing management of the above. [facility].  She still wasn't consistently taking her prescribed nightly Keflex.   Today she reports She IS taking it as prescribed.  Feels better.  She has also been noncompliant w/flagyl for trich.  She STILL has NOT taken her flagyl, nor has her partner gotten treated . "It will make me sick".  Agrees to take it today,premedicate w/zofran.  Rx for her partner (he is here today) sent and both of them told not to have sex for 2 weeks.  Pt also no showed or cancelled her last 4 appts over the last 2 weeks.  Discussed importance of twice weekly surveillence in a growth restricted fetus.  Verbalized understanding. .    BP weight and urine results all reviewed and noted. Patient reports good fetal movement, denies any bleeding and no rupture of membranes symptoms or regular contractions.  Edema:  no  Patient is without complaints other than noted in her HPI. All questions were answered.  All lab and sonogram results have been reviewed. Comments: normal  US 34+3 wks,cephalic,BPP 8/8,fhr 138 bpm,afi 12 cm,RI .59,.59=51%,post pl gr 2,normal ovaries bilat  Assessment:  1.  Pregnancy at 2750w3d,  Estimated Date of Delivery: 07/05/17 :   FGR, normal dopplers.                          3.    Medication(s) Plans:  Keflex nightlty and flagyl 2gm once  Treatment Plan:  Twice weekly surveilllance sonogram alternating with NST, delivery 37 weeks or as clinically indicated  Return for Mondays NST/HROB and Thursdays US/HROB. for appointment for high risk OB care  Meds ordered this encounter  Medications  . ondansetron (ZOFRAN) 4 MG tablet     Sig: Take 1 tablet (4 mg total) by mouth every 8 (eight) hours as needed for nausea or vomiting.    Dispense:  30 tablet    Refill:  1    Order Specific Question:   Supervising Provider    Answer:   Duane LopeEURE, LUTHER H [[id number]]   Orders Placed This Encounter  Procedures  . POCT Urinalysis Dipstick

## 2017-05-27 NOTE — Progress Notes (Signed)
US 34+3 wks,cephalic,BPP 8/8,fhr 138 bpm,afi 12 cm,RI .59,.59=51%,post pl gr 2,normal ovaries bilat

## 2017-05-31 ENCOUNTER — Other Ambulatory Visit: Payer: Medicaid Other | Admitting: Obstetrics & Gynecology

## 2017-06-01 ENCOUNTER — Ambulatory Visit (INDEPENDENT_AMBULATORY_CARE_PROVIDER_SITE_OTHER): Payer: Medicaid Other | Admitting: Obstetrics & Gynecology

## 2017-06-01 ENCOUNTER — Encounter: Payer: Self-pay | Admitting: Obstetrics & Gynecology

## 2017-06-01 VITALS — BP 98/56 | HR 81 | Wt 129.0 lb

## 2017-06-01 DIAGNOSIS — O0990 Supervision of high risk pregnancy, unspecified, unspecified trimester: Secondary | ICD-10-CM

## 2017-06-01 DIAGNOSIS — O2303 Infections of kidney in pregnancy, third trimester: Secondary | ICD-10-CM | POA: Diagnosis not present

## 2017-06-01 DIAGNOSIS — Z3A35 35 weeks gestation of pregnancy: Secondary | ICD-10-CM | POA: Diagnosis not present

## 2017-06-01 DIAGNOSIS — Z1389 Encounter for screening for other disorder: Secondary | ICD-10-CM

## 2017-06-01 DIAGNOSIS — Z331 Pregnant state, incidental: Secondary | ICD-10-CM | POA: Diagnosis not present

## 2017-06-01 DIAGNOSIS — O365930 Maternal care for other known or suspected poor fetal growth, third trimester, not applicable or unspecified: Secondary | ICD-10-CM | POA: Diagnosis not present

## 2017-06-01 DIAGNOSIS — O09893 Supervision of other high risk pregnancies, third trimester: Secondary | ICD-10-CM

## 2017-06-01 LAB — POCT URINALYSIS DIPSTICK
GLUCOSE UA: NEGATIVE
KETONES UA: NEGATIVE
NITRITE UA: NEGATIVE
Protein, UA: NEGATIVE
RBC UA: NEGATIVE

## 2017-06-01 NOTE — Progress Notes (Signed)
Fetal Surveillance Testing today:  Reactive NST   High Risk Pregnancy Diagnosis(es):   Pyelonephritis with sepsis, FGR  G1P0000 [redacted]w[redacted]d Estimated Date of Delivery: 07/05/17  Blood pressure (!) 98/56, pulse 81, weight 129 lb (58.5 kg), last menstrual period 09/28/2016.  Urinalysis: Negative   HPI: The patient is being seen today for ongoing management of as above. Today she reports taking the keflex as prescribed   BP weight and urine results all reviewed and noted. Patient reports good fetal movement, denies any bleeding and no rupture of membranes symptoms or regular contractions.  Fundal Height:  34 Fetal Heart rate:  140 Edema:  none  Patient is without complaints other than noted in her HPI. All questions were answered.  All lab and sonogram results have been reviewed. Comments:    Assessment:  1.  Pregnancy at [redacted]w[redacted]d,  Estimated Date of Delivery: 07/05/17 :                          2.  Pyelonephritis, recurrent, with sepsis                        3.  FGR  Medication(s) Plans:  finisih 4 x daily keflex then nightly keflex  Treatment Plan:  Twice weekly surveillance ith sono alt NST cont keflex  Return in about 3 days (around 06/04/2017) for HROB, BPP/sono. for appointment for high risk OB care  No orders of the defined types were placed in this encounter.  Orders Placed This Encounter  Procedures  . US Fetal BPP W/O Non Stress  . [place] UA Cord Doppler  . US OB Follow Up  . POCT urinalysis dipstick

## 2017-06-02 ENCOUNTER — Encounter: Payer: Medicaid Other | Admitting: Obstetrics and Gynecology

## 2017-06-03 ENCOUNTER — Encounter: Payer: Medicaid Other | Admitting: Obstetrics and Gynecology

## 2017-06-03 ENCOUNTER — Other Ambulatory Visit: Payer: Medicaid Other

## 2017-06-08 ENCOUNTER — Ambulatory Visit (INDEPENDENT_AMBULATORY_CARE_PROVIDER_SITE_OTHER): Payer: Medicaid Other

## 2017-06-08 ENCOUNTER — Inpatient Hospital Stay (HOSPITAL_COMMUNITY)
Admission: AD | Admit: 2017-06-08 | Discharge: 2017-06-08 | Disposition: A | Discharge status: Home / Self Care | Payer: Medicaid Other | Source: Ambulatory Visit | Attending: Family Medicine | Admitting: Family Medicine

## 2017-06-08 ENCOUNTER — Encounter (HOSPITAL_COMMUNITY): Payer: Self-pay

## 2017-06-08 ENCOUNTER — Encounter: Payer: Self-pay | Admitting: Obstetrics & Gynecology

## 2017-06-08 ENCOUNTER — Ambulatory Visit (INDEPENDENT_AMBULATORY_CARE_PROVIDER_SITE_OTHER): Payer: Medicaid Other | Admitting: Obstetrics & Gynecology

## 2017-06-08 VITALS — BP 90/50 | HR 78 | Wt 131.0 lb

## 2017-06-08 DIAGNOSIS — Z3A36 36 weeks gestation of pregnancy: Secondary | ICD-10-CM

## 2017-06-08 DIAGNOSIS — Z331 Pregnant state, incidental: Secondary | ICD-10-CM | POA: Diagnosis not present

## 2017-06-08 DIAGNOSIS — O4703 False labor before 37 completed weeks of gestation, third trimester: Secondary | ICD-10-CM | POA: Diagnosis not present

## 2017-06-08 DIAGNOSIS — M549 Dorsalgia, unspecified: Secondary | ICD-10-CM | POA: Diagnosis present

## 2017-06-08 DIAGNOSIS — Z1389 Encounter for screening for other disorder: Secondary | ICD-10-CM

## 2017-06-08 DIAGNOSIS — O365930 Maternal care for other known or suspected poor fetal growth, third trimester, not applicable or unspecified: Secondary | ICD-10-CM

## 2017-06-08 DIAGNOSIS — O2343 Unspecified infection of urinary tract in pregnancy, third trimester: Secondary | ICD-10-CM

## 2017-06-08 DIAGNOSIS — R109 Unspecified abdominal pain: Secondary | ICD-10-CM | POA: Diagnosis present

## 2017-06-08 DIAGNOSIS — O0930 Supervision of pregnancy with insufficient antenatal care, unspecified trimester: Secondary | ICD-10-CM

## 2017-06-08 DIAGNOSIS — Z3689 Encounter for other specified antenatal screening: Secondary | ICD-10-CM

## 2017-06-08 DIAGNOSIS — O0990 Supervision of high risk pregnancy, unspecified, unspecified trimester: Secondary | ICD-10-CM

## 2017-06-08 LAB — URINALYSIS, ROUTINE W REFLEX MICROSCOPIC
BILIRUBIN URINE: NEGATIVE
Bacteria, UA: NONE SEEN
Glucose, UA: NEGATIVE mg/dL
KETONES UR: NEGATIVE mg/dL
Nitrite: NEGATIVE
Protein, ur: 100 mg/dL — AB
SPECIFIC GRAVITY, URINE: 1.014 (ref 1.005–1.030)
pH: 7 (ref 5.0–8.0)

## 2017-06-08 LAB — POCT URINALYSIS DIPSTICK
Glucose, UA: NEGATIVE
KETONES UA: NEGATIVE
Nitrite, UA: POSITIVE
Protein, UA: NEGATIVE
RBC UA: NEGATIVE

## 2017-06-08 MED ORDER — SULFAMETHOXAZOLE-TRIMETHOPRIM 800-160 MG PO TABS: 1.0000 | ORAL_TABLET | Freq: Two times a day (BID) | ORAL | 0 refills | Status: DC

## 2017-06-08 MED ORDER — ACETAMINOPHEN 500 MG PO TABS
1000.0000 mg | ORAL_TABLET | Freq: Four times a day (QID) | ORAL | Status: DC | PRN
  Administered 2017-06-08: 1000 mg via ORAL
  Filled 2017-06-08: qty 2

## 2017-06-08 MED ORDER — CEPHALEXIN 500 MG PO CAPS: 500.0000 mg | ORAL_CAPSULE | Freq: Four times a day (QID) | ORAL | 0 refills | Status: DC

## 2017-06-08 MED ORDER — CEFTRIAXONE SODIUM 1 G IJ SOLR
2.0000 g | Freq: Once | INTRAMUSCULAR | Status: AC
  Administered 2017-06-08: 2 g via INTRAMUSCULAR
  Filled 2017-06-08 (×2): qty 20

## 2017-06-08 NOTE — Progress Notes (Signed)
[place] 36+1 wks,cephalic,BPP 8/8,FHR 135 BPM,post pl gr 2,afi 11.9 cm,RI .62 ,.54=54%,EFW 2541 g 24% AC 7.5 %

## 2017-06-08 NOTE — MAU Note (Signed)
Pt presents to MAU via EMS for contraction pain and back pain that started at 4 pm today. Pt states that pain is constant. Currently taking Cephalexin for Pyelo.

## 2017-06-08 NOTE — MAU Provider Note (Signed)
History     CSN: [id number]  Arrival date and time: 06/08/17 8088   First Provider Initiated Contact with Patient 06/08/17 1952      Chief Complaint  Patient presents with  . Abdominal Pain  . Back Pain   G1 @36 .1 wks here with abdominal pain and back pain. Pain started around 4pm in her right lower back. Describes as constant. Abdominal pain started around 6pm. Describes as intermittent but cannot recall frequency or consistency. Did not use anything for the pain. Denies VB and LOF. Reports good FM. No fevers. Denies urinary sx. She has hx of recurrent septic pyelo and admits to missing several does of her prophylactic Keflex.    OB History    Gravida Para Term Preterm AB Living   1 0 0 0 0 0   SAB TAB Ectopic Multiple Live Births   0              Past Medical History:  Diagnosis Date  . Abdominal pain affecting pregnancy 03/10/2017  . Candida vaginitis 03/10/2017  . Hx of trichomoniasis 11/2016  . Rh negative status during pregnancy   . Sepsis due to Escherichia coli (HCC) 03/2017  . Urinary tract infection 03/10/2017    Past Surgical History:  Procedure Laterality Date  . SKIN SURGERY     abcess    Family History  Problem Relation Age of Onset  . COPD Maternal Grandmother     Social History  Substance Use Topics  . Smoking status: Never Smoker  . Smokeless tobacco: Never Used  . Alcohol use No    Allergies:  Allergies  Allergen Reactions  . Penicillins Rash and Other (See Comments)    Has patient had a PCN reaction causing immediate rash, facial/tongue/throat swelling, SOB or lightheadedness with hypotension: No Has patient had a PCN reaction causing severe rash involving mucus membranes or skin necrosis: No Has patient had a PCN reaction that required hospitalization: No Has patient had a PCN reaction occurring within the last 10 years: No If all of the above answers are "NO", then may proceed with Cephalosporin use.    Prescriptions Prior to  Admission  Medication Sig Dispense Refill Last Dose  . cephALEXin (KEFLEX) 500 MG capsule Take 1 capsule (500 mg total) by mouth 2 (two) times daily. (Patient taking differently: Take 500 mg by mouth at bedtime. ) 60 capsule 2 Taking  . Prenatal MV-Min-FA-Omega-3 (PRENATAL GUMMIES/DHA & FA) 0.4-32.5 MG CHEW Chew 2 each by mouth daily.   Taking  . sulfamethoxazole-trimethoprim (BACTRIM DS,SEPTRA DS) 800-160 MG tablet Take 1 tablet by mouth 2 (two) times daily. 14 tablet 0     Review of Systems  Gastrointestinal: Positive for abdominal pain.  Genitourinary: Negative for vaginal bleeding.  Musculoskeletal: Positive for back pain.   Physical Exam   Blood pressure 107/71, pulse 92, temperature 98 F (36.7 C), temperature source Oral, resp. rate 18, last menstrual period 09/28/2016.  Physical Exam  Constitutional: She is oriented to person, place, and time. She appears well-developed and well-nourished. No distress.  HENT:  Head: Normocephalic and atraumatic.  Cardiovascular: Normal rate.   Respiratory: Effort normal.  GI: Soft. She exhibits no distension. There is no tenderness. There is no CVA tenderness.  gravid  Musculoskeletal: Normal range of motion.       Cervical back: Normal. She exhibits no tenderness.       Thoracic back: Normal. She exhibits no tenderness.       Lumbar back: She exhibits tenderness (  right only).  Neurological: She is alert and oriented to person, place, and time.  Skin: Skin is warm and dry.  Psychiatric: She has a normal mood and affect.   EFM: 125 bpm, mod variability, + accels, no decels Toco: irregular, irritability  Results for orders placed or performed during the hospital encounter of 06/08/17 (from the past 24 hour(s))  Urinalysis, Routine w reflex microscopic     Status: Abnormal   Collection Time: 06/08/17  7:02 PM  Result Value Ref Range   Color, Urine YELLOW YELLOW   APPearance CLOUDY (A) CLEAR   Specific Gravity, Urine 1.014 1.005 - 1.030    pH 7.0 5.0 - 8.0   Glucose, UA NEGATIVE NEGATIVE mg/dL   Hgb urine dipstick LARGE (A) NEGATIVE   Bilirubin Urine NEGATIVE NEGATIVE   Ketones, ur NEGATIVE NEGATIVE mg/dL   Protein, ur 409 (A) NEGATIVE mg/dL   Nitrite NEGATIVE NEGATIVE   Leukocytes, UA LARGE (A) NEGATIVE   RBC / HPF TOO NUMEROUS TO COUNT 0 - 5 RBC/hpf   WBC, UA TOO NUMEROUS TO COUNT 0 - 5 WBC/hpf   Bacteria, UA NONE SEEN NONE SEEN   Squamous Epithelial / LPF 0-5 (A) NONE SEEN   WBC Clumps PRESENT    Mucus PRESENT    Budding Yeast PRESENT    MAU Course  Procedures Rocephin 2g IM  MDM Labs ordered and reviewed. No evidence of pyelo. Will treat for UTI. No cervical change, no evidence of PTL. Consult with Dr. Adrian Blackwater, recommend Rocephin 2g IM now then Keflex 4x/day. Stable for discharge home.   Assessment and Plan   1. [redacted] weeks gestation of pregnancy   2. NST (non-stress test) reactive   3. Threatened premature labor in third trimester   4. Urinary tract infection in mother during third trimester of pregnancy    Discharge home Follow up in OB office as scheduled PTL precautions Rx Keflex QID x7 days  Allergies as of 06/08/2017      Reactions   Penicillins Rash, Other (See Comments)   Has patient had a PCN reaction causing immediate rash, facial/tongue/throat swelling, SOB or lightheadedness with hypotension: No Has patient had a PCN reaction causing severe rash involving mucus membranes or skin necrosis: No Has patient had a PCN reaction that required hospitalization: No Has patient had a PCN reaction occurring within the last 10 years: No If all of the above answers are "NO", then may proceed with Cephalosporin use.      Medication List    STOP taking these medications   sulfamethoxazole-trimethoprim 800-160 MG tablet Commonly known as:  BACTRIM DS,SEPTRA DS     TAKE these medications   cephALEXin 500 MG capsule Commonly known as:  KEFLEX Take 1 capsule (500 mg total) by mouth 4 (four) times  daily. What changed:  when to take this   PRENATAL GUMMIES/DHA & FA 0.4-32.5 MG Chew Chew 2 each by mouth daily.            Discharge Care Instructions        Start     Ordered   06/08/17 0000  cephALEXin (KEFLEX) 500 MG capsule  4 times daily    Question:  Supervising Provider  Answer:  Levie Heritage   06/08/17 2057   06/08/17 0000  Discharge patient    Question Answer Comment  Discharge disposition 01-Home or Self Care   Discharge patient date 06/08/2017      06/08/17 2057      Donette Larry, CNM  06/08/2017, 7:59 PM

## 2017-06-08 NOTE — Progress Notes (Signed)
Fetal Surveillance Testing today:  BPP 8/8 with normal Dopplers   High Risk Pregnancy Diagnosis(es):   Fetal growth restriction  G1P0000 [redacted]w[redacted]d Estimated Date of Delivery: 07/05/17  Blood pressure (!) 90/50, pulse 78, weight 131 lb (59.4 kg), last menstrual period 09/28/2016, not currently breastfeeding.  Urinalysis: Negative   HPI: The patient is being seen today for ongoing management of fetal growth prescription. Today she reports no complaints   BP weight and urine results all reviewed and noted. Patient reports good fetal movement, denies any bleeding and no rupture of membranes symptoms or regular contractions.  Fundal Height:  33 Fetal Heart rate:  145 Edema:  none  Patient is without complaints other than noted in her HPI. All questions were answered.  All lab and sonogram results have been reviewed. Comments:    Assessment:  1.  Pregnancy at [redacted]w[redacted]d,  Estimated Date of Delivery: 07/05/17 :                          2.  Fetal growth restriction                        3.    Medication(s) Plans:  None  Treatment Plan:  Twice weekly fetal surveillance with planned delivering at 39 weeks unless otherwise clinically indicated  Return in about 3 days (around 06/11/2017) for NST, HROB. for appointment for high risk OB care  Meds ordered this encounter  Medications  . DISCONTD: sulfamethoxazole-trimethoprim (BACTRIM DS,SEPTRA DS) 800-160 MG tablet    Sig: Take 1 tablet by mouth 2 (two) times daily.    Dispense:  14 tablet    Refill:  0   Orders Placed This Encounter  Procedures  . Urine Culture  . POCT Urinalysis Dipstick

## 2017-06-08 NOTE — Discharge Instructions (Signed)
Braxton Hicks Contractions °Contractions of the uterus can occur throughout pregnancy, but they are not always a sign that you are in labor. You may have practice contractions called Braxton Hicks contractions. These false labor contractions are sometimes confused with true labor. °What are Braxton Hicks contractions? °Braxton Hicks contractions are tightening movements that occur in the muscles of the uterus before labor. Unlike true labor contractions, these contractions do not result in opening (dilation) and thinning of the cervix. Toward the end of pregnancy (32-34 weeks), Braxton Hicks contractions can happen more often and may become stronger. These contractions are sometimes difficult to tell apart from true labor because they can be very uncomfortable. You should not feel embarrassed if you go to the hospital with false labor. °Sometimes, the only way to tell if you are in true labor is for your health care provider to look for changes in the cervix. The health care provider will do a physical exam and may monitor your contractions. If you are not in true labor, the exam should show that your cervix is not dilating and your water has not broken. °If there are no prenatal problems or other health problems associated with your pregnancy, it is completely safe for you to be sent home with false labor. You may continue to have Braxton Hicks contractions until you go into true labor. °How can I tell the difference between true labor and false labor? °· Differences °? False labor °? Contractions last 30-70 seconds.: Contractions are usually shorter and not as strong as true labor contractions. °? Contractions become very regular.: Contractions are usually irregular. °? Discomfort is usually felt in the top of the uterus, and it spreads to the lower abdomen and low back.: Contractions are often felt in the front of the lower abdomen and in the groin. °? Contractions do not go away with walking.: Contractions may  go away when you walk around or change positions while lying down. °? Contractions usually become more intense and increase in frequency.: Contractions get weaker and are shorter-lasting as time goes on. °? The cervix dilates and gets thinner.: The cervix usually does not dilate or become thin. °Follow these instructions at home: °· Take over-the-counter and prescription medicines only as told by your health care provider. °· Keep up with your usual exercises and follow other instructions from your health care provider. °· Eat and drink lightly if you think you are going into labor. °· If Braxton Hicks contractions are making you uncomfortable: °? Change your position from lying down or resting to walking, or change from walking to resting. °? Sit and rest in a tub of warm water. °? Drink enough fluid to keep your urine clear or pale yellow. Dehydration may cause these contractions. °? Do slow and deep breathing several times an hour. °· Keep all follow-up prenatal visits as told by your health care provider. This is important. °Contact a health care provider if: °· You have a fever. °· You have continuous pain in your abdomen. °Get help right away if: °· Your contractions become stronger, more regular, and closer together. °· You have fluid leaking or gushing from your vagina. °· You pass blood-tinged mucus (bloody show). °· You have bleeding from your vagina. °· You have low back pain that you never had before. °· You feel your baby’s head pushing down and causing pelvic pressure. °· Your baby is not moving inside you as much as it used to. °Summary °· Contractions that occur before labor are   called Braxton Hicks contractions, false labor, or practice contractions. °· Braxton Hicks contractions are usually shorter, weaker, farther apart, and less regular than true labor contractions. True labor contractions usually become progressively stronger and regular and they become more frequent. °· Manage discomfort from  Braxton Hicks contractions by changing position, resting in a warm bath, drinking plenty of water, or practicing deep breathing. °This information is not intended to replace advice given to you by your health care provider. Make sure you discuss any questions you have with your health care provider. °Document Released: 09/28/2005 Document Revised: 08/17/2016 Document Reviewed: 08/17/2016 °Elsevier Interactive Patient Education © 2017 [employer] Inc[employer] ° °Pregnancy and Urinary Tract Infection °What is a urinary tract infection? °A urinary tract infection (UTI) is an infection of any part of the urinary tract. This includes the kidneys, the tubes that connect your kidneys to your bladder (ureters), the bladder, and the tube that carries urine out of your body (urethra). These organs make, store, and get rid of urine in the body. A UTI can be a bladder infection (cystitis) or a kidney infection (pyelonephritis). This infection may be caused by fungi, viruses, and bacteria. Bacteria are the most common cause of UTIs. °You are more likely to develop a UTI during pregnancy because: °· The physical and hormonal changes your body goes through can make it easier for bacteria to get into your urinary tract. °· Your growing baby puts pressure on your uterus and can affect urine flow. ° °Does a UTI place my baby at risk? °An untreated UTI during pregnancy could lead to a kidney infection, which can cause health problems that could affect your baby. Possible complications of an untreated UTI include: °· Having your baby before 37 weeks of pregnancy (premature). °· Having a baby with a low birth weight. °· Developing high blood pressure during pregnancy (preeclampsia). ° °What are the symptoms of a UTI? °Symptoms of a UTI include: °· Fever. °· Frequent urination or passing small amounts of urine frequently. °· Needing to urinate urgently. °· Pain or a burning sensation with urination. °· Urine that smells bad or unusual. °· Cloudy  urine. °· Pain in the lower abdomen or back. °· Trouble urinating. °· Blood in the urine. °· Vomiting or being less hungry than normal. °· Diarrhea or abdominal pain. °· Vaginal discharge. ° °What are the treatment options for a UTI during pregnancy? °Treatment for this condition may include: °· Antibiotic medicines that are safe to take during pregnancy. °· Other medicines to treat less common causes of UTI. ° °How can I prevent a UTI? ° °To prevent a UTI: °· Go to the bathroom as soon as you feel the need. °· Always wipe from front to back. °· Wash your genital area with soap and warm water daily. °· Empty your bladder before and after sex. °· Wear cotton underwear. °· Limit your intake of high sugar foods or drinks, such as regular soda, juice, and sweets. °· Drink 6-8 glasses of water daily. °· Do not wear tight-fitting pants. °· Do not douche or use deodorant sprays. °· Do not drink alcohol, caffeine, or carbonated drinks. These can irritate the bladder. ° °Contact a health care provider if: °· Your symptoms do not improve or get worse. °· You have a fever after two days of treatment. °· You have a rash. °· You have abnormal vaginal discharge. °· You have back or side pain. °· You have chills. °· You have nausea and vomiting. °Get help right away   if: °Seek immediate medical care if you are pregnant and: °· You feel contractions in your uterus. °· You have lower belly pain. °· You have a gush of fluid from your vagina. °· You have blood in your urine. °· You are vomiting and cannot keep down any medicines or water. ° °This information is not intended to replace advice given to you by your health care provider. Make sure you discuss any questions you have with your health care provider. °Document Released: 01/23/2011 Document Revised: 09/11/2016 Document Reviewed: 08/19/2015 °Elsevier Interactive Patient Education © 2017 Elsevier Inc. ° °

## 2017-06-09 ENCOUNTER — Telehealth: Payer: Self-pay | Admitting: *Deleted

## 2017-06-09 NOTE — Telephone Encounter (Signed)
Patient's grandmother called stating patient called her complaining of contractions, leaking and vomiting in which started last night. Patient is not currently with her grandmother, she is in [place]. Informed grandmother that I would like to speak with patient directly and will contact her. Verbalized understanding.  Called patient but went to voicemail. Left message to return call.

## 2017-06-10 ENCOUNTER — Encounter: Payer: Medicaid Other | Admitting: Obstetrics and Gynecology

## 2017-06-10 LAB — URINE CULTURE

## 2017-06-11 ENCOUNTER — Other Ambulatory Visit: Payer: Medicaid Other | Admitting: Obstetrics and Gynecology

## 2017-06-16 ENCOUNTER — Encounter: Payer: Self-pay | Admitting: Advanced Practice Midwife

## 2017-06-16 ENCOUNTER — Ambulatory Visit (INDEPENDENT_AMBULATORY_CARE_PROVIDER_SITE_OTHER): Payer: Medicaid Other | Admitting: Advanced Practice Midwife

## 2017-06-16 VITALS — BP 94/56 | HR 82 | Wt 128.0 lb

## 2017-06-16 DIAGNOSIS — O365930 Maternal care for other known or suspected poor fetal growth, third trimester, not applicable or unspecified: Secondary | ICD-10-CM

## 2017-06-16 DIAGNOSIS — O09893 Supervision of other high risk pregnancies, third trimester: Secondary | ICD-10-CM | POA: Diagnosis not present

## 2017-06-16 DIAGNOSIS — Z9119 Patient's noncompliance with other medical treatment and regimen: Secondary | ICD-10-CM

## 2017-06-16 DIAGNOSIS — Z3A37 37 weeks gestation of pregnancy: Secondary | ICD-10-CM | POA: Diagnosis not present

## 2017-06-16 DIAGNOSIS — Z1389 Encounter for screening for other disorder: Secondary | ICD-10-CM

## 2017-06-16 DIAGNOSIS — Z331 Pregnant state, incidental: Secondary | ICD-10-CM | POA: Diagnosis not present

## 2017-06-16 DIAGNOSIS — O0993 Supervision of high risk pregnancy, unspecified, third trimester: Secondary | ICD-10-CM

## 2017-06-16 LAB — POCT URINALYSIS DIPSTICK
GLUCOSE UA: NEGATIVE
Ketones, UA: NEGATIVE
Nitrite, UA: NEGATIVE
PROTEIN UA: NEGATIVE
RBC UA: NEGATIVE

## 2017-06-16 NOTE — Progress Notes (Signed)
Fetal Surveillance Testing today:  NST   High Risk Pregnancy Diagnosis(es):   Pyelonephritis with sepsis, FGR.. NONCOMPLIANT WITH TESTING/MEDICATIONS.  Still not coming twice weekly.   G1P0000 5033w2d Estimated Date of Delivery: 07/05/17  Blood pressure (!) 94/56, pulse 82, weight 128 lb (58.1 kg), last menstrual period 09/28/2016.  Urinalysis: Negative   HPI: The patient is being seen today for ongoing management of the above. Today she reports she threw up her flagyl for trich (NEVER PICKED UP ZOFRAN) again.  Also hasn't been taking antibiotics, has another UTI in [facility] 8/28 (got Rocephin IM and QID keflex).   Does not plan to take flagyl, despite risks and explicit explanation of what protozoa are.  PLAN IV FLAGYL IN HOSPITAL  BP weight and urine results all  noted. Patient reports good fetal movement, denies any bleeding and no rupture of membranes symptoms or regular contractions.  Fetal Heart rate:  130 Edema:  No NST reactive:  avg LTV, + accels, no decles, neg CST (BH ctx)  Patient is without complaints other than noted in her HPI. All questions were answered.  All lab and sonogram results have been reviewed. Comments: 8 /28 EFW 24%, but AC 7.5%  Assessment:  1.  Pregnancy at 5333w2d,  Estimated Date of Delivery: 07/05/17 :                          2.  FGR (low AC); hx pyelo w/sepsis                        3.  Noncompliant w/meds/testing  Medication(s) Plans:  Keflex nightly, IV flagyl in hospital  Treatment Plan:  Twice weekly NST/US  Return for HROB, US:OB F/U:. for appointment for high risk OB care  No orders of the defined types were placed in this encounter.  Orders Placed This Encounter  Procedures  . US OB Follow Up  . US FETAL BPP WO NON STRESS  . POCT urinalysis dipstick

## 2017-06-18 ENCOUNTER — Other Ambulatory Visit: Payer: Self-pay | Admitting: Advanced Practice Midwife

## 2017-06-18 DIAGNOSIS — O365930 Maternal care for other known or suspected poor fetal growth, third trimester, not applicable or unspecified: Secondary | ICD-10-CM

## 2017-06-18 DIAGNOSIS — O0993 Supervision of high risk pregnancy, unspecified, third trimester: Secondary | ICD-10-CM

## 2017-06-21 ENCOUNTER — Encounter: Payer: Medicaid Other | Admitting: Obstetrics & Gynecology

## 2017-06-21 ENCOUNTER — Encounter: Payer: Self-pay | Admitting: Obstetrics & Gynecology

## 2017-06-21 ENCOUNTER — Ambulatory Visit (INDEPENDENT_AMBULATORY_CARE_PROVIDER_SITE_OTHER): Payer: Medicaid Other

## 2017-06-21 VITALS — BP 90/40 | HR 84 | Wt 131.0 lb

## 2017-06-21 DIAGNOSIS — O0930 Supervision of pregnancy with insufficient antenatal care, unspecified trimester: Secondary | ICD-10-CM

## 2017-06-21 DIAGNOSIS — Z3A38 38 weeks gestation of pregnancy: Secondary | ICD-10-CM | POA: Diagnosis not present

## 2017-06-21 DIAGNOSIS — O365930 Maternal care for other known or suspected poor fetal growth, third trimester, not applicable or unspecified: Secondary | ICD-10-CM | POA: Diagnosis not present

## 2017-06-21 DIAGNOSIS — O0993 Supervision of high risk pregnancy, unspecified, third trimester: Secondary | ICD-10-CM

## 2017-06-21 DIAGNOSIS — O0990 Supervision of high risk pregnancy, unspecified, unspecified trimester: Secondary | ICD-10-CM

## 2017-06-21 LAB — POCT URINALYSIS DIPSTICK
Blood, UA: NEGATIVE
Glucose, UA: NEGATIVE
KETONES UA: NEGATIVE
Nitrite, UA: NEGATIVE
Protein, UA: NEGATIVE

## 2017-06-21 NOTE — Progress Notes (Addendum)
US 38 wks,cephalic,fhr 133 bpm,BPP 8/8,AFI 13 cm,RI .55,.58=57%,EFW 2795 g 17%,BPD 3.8%,HC 11%,AC 3%,FL 49%

## 2017-06-22 ENCOUNTER — Encounter: Payer: Medicaid Other | Admitting: Obstetrics & Gynecology

## 2017-07-05 ENCOUNTER — Ambulatory Visit (INDEPENDENT_AMBULATORY_CARE_PROVIDER_SITE_OTHER): Payer: Medicaid Other | Admitting: Women's Health

## 2017-07-05 ENCOUNTER — Inpatient Hospital Stay (HOSPITAL_COMMUNITY)
Admission: AD | Admit: 2017-07-05 | Discharge: 2017-07-08 | DRG: 774 | Disposition: A | Discharge status: Home / Self Care | Payer: Medicaid Other | Source: Ambulatory Visit | Attending: Family Medicine | Admitting: Family Medicine

## 2017-07-05 ENCOUNTER — Encounter: Payer: Self-pay | Admitting: Women's Health

## 2017-07-05 ENCOUNTER — Encounter (HOSPITAL_COMMUNITY): Payer: Self-pay | Admitting: *Deleted

## 2017-07-05 VITALS — BP 120/58 | HR 93 | Wt 134.0 lb

## 2017-07-05 DIAGNOSIS — A5901 Trichomonal vulvovaginitis: Secondary | ICD-10-CM | POA: Diagnosis present

## 2017-07-05 DIAGNOSIS — R8299 Other abnormal findings in urine: Secondary | ICD-10-CM | POA: Diagnosis not present

## 2017-07-05 DIAGNOSIS — Z23 Encounter for immunization: Secondary | ICD-10-CM | POA: Diagnosis not present

## 2017-07-05 DIAGNOSIS — O365930 Maternal care for other known or suspected poor fetal growth, third trimester, not applicable or unspecified: Secondary | ICD-10-CM | POA: Diagnosis present

## 2017-07-05 DIAGNOSIS — O26893 Other specified pregnancy related conditions, third trimester: Secondary | ICD-10-CM | POA: Diagnosis present

## 2017-07-05 DIAGNOSIS — D6959 Other secondary thrombocytopenia: Secondary | ICD-10-CM | POA: Diagnosis present

## 2017-07-05 DIAGNOSIS — O0930 Supervision of pregnancy with insufficient antenatal care, unspecified trimester: Secondary | ICD-10-CM

## 2017-07-05 DIAGNOSIS — R82998 Other abnormal findings in urine: Secondary | ICD-10-CM

## 2017-07-05 DIAGNOSIS — Z880 Allergy status to penicillin: Secondary | ICD-10-CM

## 2017-07-05 DIAGNOSIS — Z30017 Encounter for initial prescription of implantable subdermal contraceptive: Secondary | ICD-10-CM

## 2017-07-05 DIAGNOSIS — Z3A40 40 weeks gestation of pregnancy: Secondary | ICD-10-CM

## 2017-07-05 DIAGNOSIS — O0990 Supervision of high risk pregnancy, unspecified, unspecified trimester: Secondary | ICD-10-CM

## 2017-07-05 DIAGNOSIS — O9912 Other diseases of the blood and blood-forming organs and certain disorders involving the immune mechanism complicating childbirth: Secondary | ICD-10-CM | POA: Diagnosis present

## 2017-07-05 DIAGNOSIS — O98813 Other maternal infectious and parasitic diseases complicating pregnancy, third trimester: Secondary | ICD-10-CM | POA: Diagnosis not present

## 2017-07-05 DIAGNOSIS — Z1389 Encounter for screening for other disorder: Secondary | ICD-10-CM | POA: Diagnosis not present

## 2017-07-05 DIAGNOSIS — Z6791 Unspecified blood type, Rh negative: Secondary | ICD-10-CM

## 2017-07-05 DIAGNOSIS — O0933 Supervision of pregnancy with insufficient antenatal care, third trimester: Secondary | ICD-10-CM

## 2017-07-05 DIAGNOSIS — Z331 Pregnant state, incidental: Secondary | ICD-10-CM

## 2017-07-05 DIAGNOSIS — Z3490 Encounter for supervision of normal pregnancy, unspecified, unspecified trimester: Secondary | ICD-10-CM | POA: Diagnosis present

## 2017-07-05 DIAGNOSIS — D649 Anemia, unspecified: Secondary | ICD-10-CM | POA: Diagnosis present

## 2017-07-05 DIAGNOSIS — O9832 Other infections with a predominantly sexual mode of transmission complicating childbirth: Secondary | ICD-10-CM | POA: Diagnosis present

## 2017-07-05 DIAGNOSIS — O0993 Supervision of high risk pregnancy, unspecified, third trimester: Secondary | ICD-10-CM

## 2017-07-05 DIAGNOSIS — O9902 Anemia complicating childbirth: Secondary | ICD-10-CM | POA: Diagnosis present

## 2017-07-05 DIAGNOSIS — A599 Trichomoniasis, unspecified: Secondary | ICD-10-CM | POA: Diagnosis not present

## 2017-07-05 LAB — POCT URINALYSIS DIPSTICK
Glucose, UA: NEGATIVE
Ketones, UA: NEGATIVE
NITRITE UA: NEGATIVE
PROTEIN UA: NEGATIVE

## 2017-07-05 LAB — POCT WET PREP (WET MOUNT): Clue Cells Wet Prep Whiff POC: POSITIVE

## 2017-07-05 LAB — GROUP B STREP BY PCR: GROUP B STREP BY PCR: NEGATIVE

## 2017-07-05 LAB — CBC
HEMATOCRIT: 29.6 % — AB (ref 36.0–46.0)
Hemoglobin: 9.8 g/dL — ABNORMAL LOW (ref 12.0–15.0)
MCH: 27.3 pg (ref 26.0–34.0)
MCHC: 33.1 g/dL (ref 30.0–36.0)
MCV: 82.5 fL (ref 78.0–100.0)
Platelets: 178 10*3/uL (ref 150–400)
RBC: 3.59 MIL/uL — AB (ref 3.87–5.11)
RDW: 13.8 % (ref 11.5–15.5)
WBC: 12.1 10*3/uL — AB (ref 4.0–10.5)

## 2017-07-05 LAB — OB RESULTS CONSOLE GBS: GBS: NEGATIVE

## 2017-07-05 MED ORDER — OXYTOCIN 40 UNITS IN LACTATED RINGERS INFUSION - SIMPLE MED
1.0000 m[IU]/min | INTRAVENOUS | Status: DC
  Administered 2017-07-05: 2 m[IU]/min via INTRAVENOUS
  Filled 2017-07-05: qty 1000

## 2017-07-05 MED ORDER — OXYTOCIN BOLUS FROM INFUSION
500.0000 mL | Freq: Once | INTRAVENOUS | Status: AC
  Administered 2017-07-06: 500 mL via INTRAVENOUS

## 2017-07-05 MED ORDER — OXYTOCIN 40 UNITS IN LACTATED RINGERS INFUSION - SIMPLE MED
2.5000 [IU]/h | INTRAVENOUS | Status: DC
  Administered 2017-07-06: 2.5 [IU]/h via INTRAVENOUS

## 2017-07-05 MED ORDER — OXYCODONE-ACETAMINOPHEN 5-325 MG PO TABS: 2.0000 | ORAL_TABLET | ORAL | Status: DC | PRN

## 2017-07-05 MED ORDER — FENTANYL CITRATE (PF) 100 MCG/2ML IJ SOLN
100.0000 ug | INTRAMUSCULAR | Status: DC | PRN
  Administered 2017-07-06 (×3): 100 ug via INTRAVENOUS
  Filled 2017-07-05 (×3): qty 2

## 2017-07-05 MED ORDER — ACETAMINOPHEN 325 MG PO TABS: 650.0000 mg | ORAL_TABLET | ORAL | Status: DC | PRN

## 2017-07-05 MED ORDER — LACTATED RINGERS IV SOLN
INTRAVENOUS | Status: DC
  Administered 2017-07-05 – 2017-07-06 (×2): via INTRAVENOUS

## 2017-07-05 MED ORDER — TERBUTALINE SULFATE 1 MG/ML IJ SOLN
0.2500 mg | Freq: Once | INTRAMUSCULAR | Status: DC | PRN
  Filled 2017-07-05: qty 1

## 2017-07-05 MED ORDER — OXYCODONE-ACETAMINOPHEN 5-325 MG PO TABS: 1.0000 | ORAL_TABLET | ORAL | Status: DC | PRN

## 2017-07-05 MED ORDER — SOD CITRATE-CITRIC ACID 500-334 MG/5ML PO SOLN: 30.0000 mL | ORAL | Status: DC | PRN

## 2017-07-05 MED ORDER — METRONIDAZOLE IN NACL 5-0.79 MG/ML-% IV SOLN
500.0000 mg | INTRAVENOUS | Status: AC
  Administered 2017-07-05 (×4): 500 mg via INTRAVENOUS
  Filled 2017-07-05 (×4): qty 100

## 2017-07-05 MED ORDER — LIDOCAINE HCL (PF) 1 % IJ SOLN
30.0000 mL | INTRAMUSCULAR | Status: DC | PRN
  Administered 2017-07-06: 30 mL via SUBCUTANEOUS
  Filled 2017-07-05: qty 30

## 2017-07-05 MED ORDER — ONDANSETRON HCL 4 MG/2ML IJ SOLN: 4.0000 mg | Freq: Four times a day (QID) | INTRAMUSCULAR | Status: DC | PRN

## 2017-07-05 MED ORDER — LACTATED RINGERS IV SOLN: 500.0000 mL | INTRAVENOUS | Status: DC | PRN

## 2017-07-05 NOTE — Anesthesia Pain Management Evaluation Note (Signed)
  CRNA Pain Management Visit Note  Patient: April Young, 21 y.o., female  "Hello I am a member of the anesthesia team at [facility]. We have an anesthesia team available at all times to provide care throughout the hospital, including epidural management and anesthesia for C-section. I don't know your plan for the delivery whether it a natural birth, water birth, IV sedation, nitrous supplementation, doula or epidural, but we want to meet your pain goals."   1.Was your pain managed to your expectations on prior hospitalizations?   No prior hospitalizations  2.What is your expectation for pain management during this hospitalization?     Epidural  3.How can we help you reach that goal?   Record the patient's initial score and the patient's pain goal.   Pain: 0  Pain Goal: 8 [facility] wants you to be able to say your pain was always managed very well.  Laban Emperor 07/05/2017

## 2017-07-05 NOTE — Progress Notes (Signed)
Labor Progress Note  April Young is a 21 y.o. G1P0000 at [redacted]w[redacted]d  admitted for induction of labor due to IUGR.  S: Pt reports feeling fine. Having minimal discomfort with contractions.   O:  BP 107/67   Pulse 68   Temp 98.2 F (36.8 C) (Oral)   Resp 16   Ht  (1.753 m)   Wt 60.8 kg (134 lb)   LMP 09/28/2016 (Exact Date)   BMI 19.79 kg/m   No intake/output data recorded.  FHT:  FHR: 120 bpm, variability: moderate,  accelerations:  Present,  decelerations:  Absent UC:   regular, every 2-3 minutes SVE:   Dilation: 4 Effacement (%): 70 Station: +1 Exam by:: Dr. Doroteo Glassman Membranes intact  Pitocin @ 46mu/min  Labs: Lab Results  Component Value Date   WBC 12.1 (H) 07/05/2017   HGB 9.8 (L) 07/05/2017   HCT 29.6 (L) 07/05/2017   MCV 82.5 07/05/2017   PLT 178 07/05/2017    Assessment / Plan: 21 y.o. G1P0000 [redacted]w[redacted]d here for IOL due to IUGR. Not yet in labor.    Labor: Not initiated. Currently on 16 milli-units/min Pitocin. Advancing 2 x 2. Will plan to AROM when she gets in active labor.  Fetal Wellbeing:  Category I Pain Control:  Labor support without medications Anticipated MOD:  NSVD   Caryl Ada, DO OB Fellow 07/05/2017, 11:27 PM

## 2017-07-05 NOTE — H&P (Signed)
LABOR ADMISSION HISTORY AND PHYSICAL  April Young is a 21 y.o. female G1P0000 with IUP at [redacted]w[redacted]d by LMP and 14 wk [place] presenting for IOL for IUGR. She reports +FMs, No LOF, no VB, no blurry vision, no headaches, no peripheral edema, and no RUQ pain.  She plans on breast and bottle feeding. She is considering Nexplanon for birth control.  Dating: By LMP and 14 wk [place] --->  Estimated Date of Delivery: 07/05/17  Sono:   , CWD, normal anatomy, cephalic presentation, 2795g, 16% EFW  Patient initiated prenatal care at 16 weeks at [facility]  Prenatal History/Complications: IUGR - noncompliant with antenatal testing Late and limited PNC Trichomonas without adherence to treatment Pyelonephritis w/ sepsis in early pregnancy, not adherent to suppressive therapy Rh negative  Past Medical History: Past Medical History:  Diagnosis Date  . Abdominal pain affecting pregnancy 03/10/2017  . Candida vaginitis 03/10/2017  . Hx of trichomoniasis 11/2016  . Rh negative status during pregnancy   . Sepsis due to Escherichia coli (HCC) 03/2017  . Urinary tract infection 03/10/2017    Past Surgical History: Past Surgical History:  Procedure Laterality Date  . SKIN SURGERY     abcess    Obstetrical History: OB History    Gravida Para Term Preterm AB Living   1 0 0 0 0 0   SAB TAB Ectopic Multiple Live Births   0              Social History: Social History   Social History  . Marital status: Single    Spouse name: N/A  . Number of children: N/A  . Years of education: N/A   Social History Main Topics  . Smoking status: Never Smoker  . Smokeless tobacco: Never Used  . Alcohol use No  . Drug use: No     Comment: states she stopped with pregnancy  . Sexual activity: Not Currently    Birth control/ protection: None   Other Topics Concern  . Not on file   Social History Narrative  . No narrative on file    Family History: Family History  Problem Relation Age of Onset  . COPD  Maternal Grandmother     Allergies: Allergies  Allergen Reactions  . Penicillins Rash and Other (See Comments)    Has patient had a PCN reaction causing immediate rash, facial/tongue/throat swelling, SOB or lightheadedness with hypotension: No Has patient had a PCN reaction causing severe rash involving mucus membranes or skin necrosis: No Has patient had a PCN reaction that required hospitalization: No Has patient had a PCN reaction occurring within the last 10 years: No If all of the above answers are "NO", then may proceed with Cephalosporin use.    Prescriptions Prior to Admission  Medication Sig Dispense Refill Last Dose  . cephALEXin (KEFLEX) 500 MG capsule Take 1 capsule (500 mg total) by mouth 4 (four) times daily. (Patient not taking: Reported on 07/05/2017) 28 capsule 0 Not Taking  . Prenatal MV-Min-FA-Omega-3 (PRENATAL GUMMIES/DHA & FA) 0.4-32.5 MG CHEW Chew 2 each by mouth daily.   Taking     Review of Systems   All systems reviewed and negative except as stated in HPI  Last menstrual period 09/28/2016. General appearance: alert, cooperative, appears stated age and no distress Lungs: normal work of breathing Extremities: Homans sign is negative, no sign of DVT Presentation: cephalic Fetal monitoringBaseline: 130 bpm, Variability: Good {> 6 bpm), Accelerations: Reactive and Decelerations: Absent Uterine activityFrequency: Every 2-3 minutes  Prenatal labs: ABO, Rh: --/--/A NEG (08/11 1610) Antibody: POS (08/11 0948) Rubella: Immune RPR: Non Reactive (07/06 0901)  HBsAg: Negative (04/10 1447)  HIV:  Non Reactive  GBS:  Unknown GTT: Fasting- 66, 1 hr- 139, 2 hr- 93  Prenatal Transfer Tool  Maternal Diabetes: No Genetic Screening: Normal Maternal Ultrasounds/Referrals: Abnormal:  Findings:   IUGR Fetal Ultrasounds or other Referrals:  None Maternal Substance Abuse:  No Significant Maternal Medications:  None Significant Maternal Lab Results:  None  Results for orders placed or performed in visit on 07/05/17 (from the past 24 hour(s))  POCT urinalysis dipstick   Collection Time: 07/05/17 12:16 PM  Result Value Ref Range   Color, UA     Clarity, UA     Glucose, UA neg    Bilirubin, UA     Ketones, UA neg    Spec Grav, UA  1.010 - 1.025   Blood, UA small    pH, UA  5.0 - 8.0   Protein, UA neg    Urobilinogen, UA  0.2 or 1.0 E.U./dL   Nitrite, UA neg    Leukocytes, UA Large (3+) (A) Negative  POCT Wet Prep [facility])   Collection Time: 07/05/17  1:09 PM  Result Value Ref Range   Source Wet Prep POC vaginal    WBC, Wet Prep HPF POC many    Bacteria Wet Prep HPF POC None (A) Few   BACTERIA WET PREP MORPHOLOGY POC     Clue Cells Wet Prep HPF POC None None   Clue Cells Wet Prep Whiff POC Positive Whiff    Yeast Wet Prep HPF POC None    KOH Wet Prep POC     Trichomonas Wet Prep HPF POC Present (A) Absent    Patient Active Problem List   Diagnosis Date Noted  . Encounter for induction of labor 07/05/2017  . Trichomonas infection 05/13/2017  . IUGR (intrauterine growth restriction) affecting care of mother 04/15/2017  . Pyelonephritis complicating pregnancy, antepartum, third trimester 04/09/2017  . Gestational thrombocytopenia (HCC) 03/16/2017  . Anemia affecting pregnancy in second trimester 03/15/2017  . Bacteremia due to Escherichia coli 03/15/2017  . Pyelonephritis 03/13/2017  . Supervision of high risk pregnancy, antepartum 01/19/2017  . Late prenatal care 01/19/2017    Assessment: April Young is a 21 y.o. G1P0000 at [redacted]w[redacted]d here for IOL for IUGR.  #Labor: will start 2x2 pitocin when patient is finished eating #Pain: Epidural upon request #FWB: Cat 1 #ID: GBS unknown - awaiting PCR results #MOF: breast and bottle #MOC: considering Nexplanon #Circ:  N/A  Trichomonas positive on exam this morning; will start IV Flagyl.  Lezlie Octave, MD Family Medicine Resident PGY-1  07/05/2017, 3:22 PM  I  confirm that I have verified the information documented in the resident's note and that I have also personally reperformed the physical exam and all medical decision making activities.  Thressa Sheller 4:56 PM 07/05/17

## 2017-07-05 NOTE — Progress Notes (Signed)
[facility] High-Risk Pregnancy Visit  Patient name: April Young MRN [id number]  Date of birth: Feb 02, 1996 CC & HPI:  April Young is a 21 y.o. G1P0000 female at [redacted]w[redacted]d with an Estimated Date of Delivery: 07/05/17 being seen today for ongoing management of a high-risk pregnancy complicated by IUGR-noncompliant with antenatal testing, late and limited prenatal care, trichomonas- noncompliant w/ meds, pyelonephritis w/ sepsis earlier in pregnancy- noncompliant with suppression therapy.   No visit with provider since 37.2wks  Last U/S: 9/10 @ 38.0wks: afi 13cm, BPD 4%, HC 11%, AC 3%, FL 49%, overall EFW 17%/2796g, dopp 56%, BPP 8/8  Today she reports she has 'been in [place]', has problems with transportation 'sometimes'. States she did take meds for trichomonas.    Review of Systems:   Reports good fetal movement. Denies regular contractions, leakage of fluid, vaginal bleeding, abnormal vaginal discharge w/ itching/odor/irritation, headaches, visual changes, shortness of breath, chest pain, abdominal pain, severe nausea/vomiting, or problems with urination or bowel movements.   Pertinent History Reviewed:  Reviewed past medical,surgical and family history.  Reviewed problem list, medications and allergies.  Objective Findings:   Vitals:   07/05/17 1211  BP: (!) 120/58  Pulse: 93  Weight: 134 lb (60.8 kg)    Body mass index is 20.37 kg/m.  Fundal Height:  36 Fetal Heart rate:  149 Edema:  None GBS, gc/ct collected Spec exam: mod amt thin frothy white malodorous d/c SVE per request: tight 3/50/-2, vtx Fetal Surveillance Testing today:  FHT, to [facility] for direct admit  Results for orders placed or performed in visit on 07/05/17 (from the past 24 hour(s))  POCT urinalysis dipstick   Collection Time: 07/05/17 12:16 PM  Result Value Ref Range   Color, UA     Clarity, UA     Glucose, UA neg    Bilirubin, UA     Ketones, UA neg    Spec Grav, UA  1.010 - 1.025   Blood, UA small    pH, UA  5.0 - 8.0   Protein, UA neg    Urobilinogen, UA  0.2 or 1.0 E.U./dL   Nitrite, UA neg    Leukocytes, UA Large (3+) (A) Negative  POCT Wet Prep Mell[place])   Collection Time: 07/05/17  1:09 PM  Result Value Ref Range   Source Wet Prep POC vaginal    WBC, Wet Prep HPF POC many    Bacteria Wet Prep HPF POC None (A) Few   BACTERIA WET PREP MORPHOLOGY POC     Clue Cells Wet Prep HPF POC None None   Clue Cells Wet Prep Whiff POC Positive Whiff    Yeast Wet Prep HPF POC None    KOH Wet Prep POC     Trichomonas Wet Prep HPF POC Present (A) Absent     Assessment & Plan:   1) High-risk pregnancy G1P0000 at [redacted]w[redacted]d with an Estimated Date of Delivery: 07/05/17  2) IUGR, non-compliant w/ antenatal testing, 1wk past due for IOL 3) Late and limited prenatal care, no visit in 3wks 4) 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 Pt's friend drove her here today, friend present in room and states she will take her to [facility]  today.   Reviewed: plans depo for contraception>advised abstinence until pp visit All questions were answered  Return for 4-6wks for pp visit.   Orders Placed This Encounter  Procedures  . GC/Chlamydia Probe Amp  . Strep Gp B NAA+Rflx  . Urine Culture  . POCT urinalysis dipstick  . POCT [employer] Prep ([address]     Marge Duncans CNM, [facility] 07/05/2017 1:15 PM

## 2017-07-05 NOTE — Progress Notes (Signed)
Labor Progress Note  April Young is a 21 y.o. G1P0000 at [redacted]w[redacted]d  admitted for induction of labor due to IUGR.  S: Pt reports doing feeling fine, minimal pain, and minimal contraction sensations (despite monitor recording very frequent contractions). Endorses fetal movements.   Denies PIH symptoms (HA, scotomata, RUQ pain)  O:  Temp 99 F (37.2 C) (Oral)   Resp 16   Ht  (1.753 m)   Wt 60.8 kg (134 lb)   LMP 09/28/2016 (Exact Date)   BMI 19.79 kg/m   No intake/output data recorded.  FHT:  FHR: 135 bpm, variability: moderate,  accelerations:  Present,  decelerations:  Absent UC:   regular, every 3 minutes SVE:   Dilation: 3 Effacement (%): 50 Station: -2 Exam by:: Foley,rn SROM/AROM: None   Pitocin initiated @ 1730. 2 x 2  Labs: Lab Results  Component Value Date   WBC 12.1 (H) 07/05/2017   HGB 9.8 (L) 07/05/2017   HCT 29.6 (L) 07/05/2017   MCV 82.5 07/05/2017   PLT 178 07/05/2017    Assessment / Plan: 21 y.o. G1P0000 [redacted]w[redacted]d here for IOL due to IUGR. Not yet in labor.    Labor: Not initiated. Currently on 8 milli-units/min Pitocin. Advancing 2 x 2. Willc consider addition of Cytotec due to thick cervix.  Fetal Wellbeing:  Category I Pain Control:  Labor support without medications Anticipated MOD:  NSVD

## 2017-07-06 ENCOUNTER — Encounter (HOSPITAL_COMMUNITY): Payer: Self-pay

## 2017-07-06 DIAGNOSIS — O99119 Other diseases of the blood and blood-forming organs and certain disorders involving the immune mechanism complicating pregnancy, unspecified trimester: Secondary | ICD-10-CM

## 2017-07-06 DIAGNOSIS — Z3A40 40 weeks gestation of pregnancy: Secondary | ICD-10-CM

## 2017-07-06 DIAGNOSIS — O365990 Maternal care for other known or suspected poor fetal growth, unspecified trimester, not applicable or unspecified: Secondary | ICD-10-CM

## 2017-07-06 LAB — RPR: RPR: NONREACTIVE

## 2017-07-06 MED ORDER — SIMETHICONE 80 MG PO CHEW: 80.0000 mg | CHEWABLE_TABLET | ORAL | Status: DC | PRN

## 2017-07-06 MED ORDER — ACETAMINOPHEN 325 MG PO TABS: 650.0000 mg | ORAL_TABLET | ORAL | Status: DC | PRN

## 2017-07-06 MED ORDER — COCONUT OIL OIL
1.0000 | TOPICAL_OIL | Status: DC | PRN
Start: 2017-07-06 — End: 2017-07-08

## 2017-07-06 MED ORDER — DIPHENHYDRAMINE HCL 50 MG/ML IJ SOLN: 12.5000 mg | INTRAMUSCULAR | Status: DC | PRN

## 2017-07-06 MED ORDER — PRENATAL MULTIVITAMIN CH
1.0000 | ORAL_TABLET | Freq: Every day | ORAL | Status: DC
  Administered 2017-07-07: 1 via ORAL
  Filled 2017-07-06: qty 1

## 2017-07-06 MED ORDER — DIBUCAINE 1 % RE OINT: 1.0000 "application " | TOPICAL_OINTMENT | RECTAL | Status: DC | PRN

## 2017-07-06 MED ORDER — BENZOCAINE-MENTHOL 20-0.5 % EX AERO
1.0000 "application " | INHALATION_SPRAY | CUTANEOUS | Status: DC | PRN
  Administered 2017-07-06: 1 via TOPICAL
  Filled 2017-07-06: qty 56

## 2017-07-06 MED ORDER — FENTANYL 2.5 MCG/ML BUPIVACAINE 1/10 % EPIDURAL INFUSION (WH - ANES)
14.0000 mL/h | INTRAMUSCULAR | Status: DC | PRN
  Filled 2017-07-06: qty 100

## 2017-07-06 MED ORDER — LACTATED RINGERS IV SOLN: 500.0000 mL | Freq: Once | INTRAVENOUS | Status: DC

## 2017-07-06 MED ORDER — EPHEDRINE 5 MG/ML INJ
10.0000 mg | INTRAVENOUS | Status: DC | PRN
  Filled 2017-07-06: qty 2

## 2017-07-06 MED ORDER — ZOLPIDEM TARTRATE 5 MG PO TABS: 5.0000 mg | ORAL_TABLET | Freq: Every evening | ORAL | Status: DC | PRN

## 2017-07-06 MED ORDER — ONDANSETRON HCL 4 MG/2ML IJ SOLN: 4.0000 mg | INTRAMUSCULAR | Status: DC | PRN

## 2017-07-06 MED ORDER — IBUPROFEN 600 MG PO TABS
600.0000 mg | ORAL_TABLET | Freq: Four times a day (QID) | ORAL | Status: DC
  Administered 2017-07-06 – 2017-07-08 (×8): 600 mg via ORAL
  Filled 2017-07-06 (×8): qty 1

## 2017-07-06 MED ORDER — WITCH HAZEL-GLYCERIN EX PADS: 1.0000 "application " | MEDICATED_PAD | CUTANEOUS | Status: DC | PRN

## 2017-07-06 MED ORDER — TETANUS-DIPHTH-ACELL PERTUSSIS 5-2.5-18.5 LF-MCG/0.5 IM SUSP: 0.5000 mL | Freq: Once | INTRAMUSCULAR | Status: DC

## 2017-07-06 MED ORDER — SENNOSIDES-DOCUSATE SODIUM 8.6-50 MG PO TABS
2.0000 | ORAL_TABLET | ORAL | Status: DC
  Administered 2017-07-06 – 2017-07-08 (×2): 2 via ORAL
  Filled 2017-07-06 (×2): qty 2

## 2017-07-06 MED ORDER — PHENYLEPHRINE 40 MCG/ML (10ML) SYRINGE FOR IV PUSH (FOR BLOOD PRESSURE SUPPORT)
80.0000 ug | PREFILLED_SYRINGE | INTRAVENOUS | Status: DC | PRN
  Filled 2017-07-06: qty 5
  Filled 2017-07-06: qty 10

## 2017-07-06 MED ORDER — ONDANSETRON HCL 4 MG PO TABS: 4.0000 mg | ORAL_TABLET | ORAL | Status: DC | PRN

## 2017-07-06 MED ORDER — INFLUENZA VAC SPLIT QUAD 0.5 ML IM SUSY
0.5000 mL | PREFILLED_SYRINGE | INTRAMUSCULAR | Status: AC
  Administered 2017-07-07: 0.5 mL via INTRAMUSCULAR
  Filled 2017-07-06: qty 0.5

## 2017-07-06 MED ORDER — PHENYLEPHRINE 40 MCG/ML (10ML) SYRINGE FOR IV PUSH (FOR BLOOD PRESSURE SUPPORT)
80.0000 ug | PREFILLED_SYRINGE | INTRAVENOUS | Status: DC | PRN
  Filled 2017-07-06: qty 5

## 2017-07-06 MED ORDER — DIPHENHYDRAMINE HCL 25 MG PO CAPS: 25.0000 mg | ORAL_CAPSULE | Freq: Four times a day (QID) | ORAL | Status: DC | PRN

## 2017-07-06 NOTE — Progress Notes (Signed)
Labor Progress Note  April Young is a 21 y.o. G1P0000 at [redacted]w[redacted]d  admitted for induction of labor due to IUGR.  S: Pt resting comfortably. Having minimal discomfort with contractions.   O:  BP 102/67   Pulse 69   Temp 98.3 F (36.8 C) (Oral)   Resp 18   Ht  (1.753 m)   Wt 60.8 kg (134 lb)   LMP 09/28/2016 (Exact Date)   BMI 19.79 kg/m   No intake/output data recorded.  FHT:  FHR: 120 bpm, variability: moderate,  accelerations:  Present,  decelerations:  Absent UC:   regular, every 1-3 minutes SVE:   Dilation: 5 Effacement (%): 70 Station: +1 Exam by:: Dr. Doroteo Glassman AROM   Pitocin @ 22mu/min  Labs: Lab Results  Component Value Date   WBC 12.1 (H) 07/05/2017   HGB 9.8 (L) 07/05/2017   HCT 29.6 (L) 07/05/2017   MCV 82.5 07/05/2017   PLT 178 07/05/2017    Assessment / Plan: 21 y.o. G1P0000 [redacted]w[redacted]d here for IOL due to IUGR.  Labor: Latent labor. Currently on 22 milli-units/min Pitocin. Advancing 2 x 2. AROM'd to help augment labor.  Fetal Wellbeing:  Category I Pain Control:  Labor support without medications Anticipated MOD:  NSVD   Caryl Ada, DO OB Fellow 07/06/2017, 3:48 AM

## 2017-07-07 ENCOUNTER — Encounter (HOSPITAL_COMMUNITY): Payer: Self-pay | Admitting: *Deleted

## 2017-07-07 LAB — STREP GP B NAA+RFLX: Strep Gp B NAA+Rflx: NEGATIVE

## 2017-07-07 LAB — GC/CHLAMYDIA PROBE AMP
Chlamydia trachomatis, NAA: NEGATIVE
NEISSERIA GONORRHOEAE BY PCR: NEGATIVE

## 2017-07-07 LAB — URINE CULTURE: ORGANISM ID, BACTERIA: NO GROWTH

## 2017-07-07 MED ORDER — IBUPROFEN 600 MG PO TABS: 600.0000 mg | ORAL_TABLET | Freq: Four times a day (QID) | ORAL | 0 refills | Status: DC

## 2017-07-07 MED ORDER — RHO D IMMUNE GLOBULIN 1500 UNIT/2ML IJ SOSY
300.0000 ug | PREFILLED_SYRINGE | Freq: Once | INTRAMUSCULAR | Status: AC
  Administered 2017-07-07: 300 ug via INTRAVENOUS
  Filled 2017-07-07: qty 2

## 2017-07-07 MED ORDER — SENNOSIDES-DOCUSATE SODIUM 8.6-50 MG PO TABS: 2.0000 | ORAL_TABLET | Freq: Every evening | ORAL | 0 refills | Status: DC | PRN

## 2017-07-07 NOTE — Discharge Instructions (Signed)

## 2017-07-07 NOTE — Progress Notes (Signed)
Post Partum Day 1 Subjective: Patient is doing well this morning with minimal abdominal discomfort well controlled with motrin. She attempted breast feeding without success and plans to bottle feed. Reports minimal vaginal bleeding, Up ad lib, voiding ok, + flatulence. Patient wants nexplanon for contraception. Patient ready to go home today if possible.   Objective: Blood pressure 104/64, pulse 68, temperature 98 F (36.7 C), temperature source Oral, resp. rate 18, height  (1.753 m), weight 60.8 kg (134 lb), last menstrual period 09/28/2016, unknown if currently breastfeeding.  Physical Exam:  General: alert, cooperative, appears stated age and no distress Lochia: appropriate Uterine Fundus: firm Incision: N/A DVT Evaluation: No evidence of DVT seen on physical exam.   Recent Labs  07/05/17 1540  HGB 9.8*  HCT 29.6*    Assessment/Plan: Discharge home   Nexplanon for [facility]   LOS: 2 days   Dyke Maes Chalsey Leeth PA-S 07/07/2017, 7:50 AM

## 2017-07-08 DIAGNOSIS — Z30017 Encounter for initial prescription of implantable subdermal contraceptive: Secondary | ICD-10-CM

## 2017-07-08 LAB — RH IG WORKUP (INCLUDES ABO/RH)
ABO/RH(D): A NEG
FETAL SCREEN: NEGATIVE
Gestational Age(Wks): 40
UNIT DIVISION: 0

## 2017-07-08 MED ORDER — LIDOCAINE HCL 1 % IJ SOLN
0.0000 mL | Freq: Once | INTRAMUSCULAR | Status: AC | PRN
  Administered 2017-07-08: 10 mL via INTRADERMAL
  Filled 2017-07-08: qty 20

## 2017-07-08 MED ORDER — ETONOGESTREL 68 MG ~~LOC~~ IMPL
68.0000 mg | DRUG_IMPLANT | Freq: Once | SUBCUTANEOUS | Status: AC
  Administered 2017-07-08: 68 mg via SUBCUTANEOUS
  Filled 2017-07-08: qty 1

## 2017-07-08 NOTE — Procedures (Signed)
PROCEDURE NOTE: Nexplanon Insertion  Patient given informed consent, signed copy in the chart, time out was performed.  Postpartum contraception.  Patient's left arm was prepped and draped in the usual sterile fashion. The ruler used to measure and mark insertion area. Pt was prepped with alcohol swab and then injected with 10 cc of 1% lidocaine with epinephrine used to anesthetize the area.  Pt was prepped with betadine, nexplanon removed from packaging, device confirmed in needle, then inserted full length of needle and withdrawn per manufacturer's instructions. Minimal blood loss. The insertion site covered with antibiotic ointment and a pressure bandage to minimize bruising. There were no complications and the patient tolerated the procedure well.  Device information was given in handout form. Patient is informed the removal date will be in three years and package insert card filled out and given to her.  Nexplanon lot number [id number]  [employer]. Frances Furbish, MD PGY-1, [facility] 07/08/2017 10:31 AM

## 2017-07-08 NOTE — Discharge Summary (Signed)
OB Discharge Summary     Patient Name: April Young DOB: Feb 14, 1996 MRN: [id number]  Date of admission: 07/05/2017 Delivering MD: Pincus Large   Date of discharge: 07/08/2017  Admitting diagnosis: 40wks INDUCTION Intrauterine pregnancy: [redacted]w[redacted]d     Secondary diagnosis:  Active Problems:   Encounter for induction of labor  Additional problems: +Trichomoniasis, failed prior outpatient treatment. Treated intrapartum with IV flagyl.  IUGR Recurrent pyelonephritis during pregnancy Rh negative     Discharge diagnosis: Term Pregnancy Delivered                                                                                                Post partum procedures:rhogam  Augmentation: AROM and Pitocin  Complications: None  Hospital course:  Induction of Labor With Vaginal Delivery   21 y.o. yo G1P1001 at [redacted]w[redacted]d was admitted to the hospital 07/05/2017 for induction of labor.  Indication for induction: IUGR.  Patient had an uncomplicated labor course as follows: Membrane Rupture Time/Date: 3:18 AM ,07/06/2017   Intrapartum Procedures: Episiotomy: None [1]                                         Lacerations:  Labial [10]  Patient had delivery of a Viable infant.  Information for the patient's newborn:  Mccayla, Shimada [[id number]]      07/06/2017  Details of delivery can be found in separate delivery note.  Patient had a routine postpartum course. Patient is discharged home 07/08/17.  Physical exam  Vitals:   07/06/17 1846 07/07/17 0519 07/07/17 1810 07/08/17 0652  BP: 103/62 104/64 111/73 122/76  Pulse: 75 68 (!) 55 (!) 58  Resp: Temp: 98.7 F (37.1 C) 98 F (36.7 C) 98.2 F (36.8 C) 98.4 F (36.9 C)  TempSrc: Oral Oral Oral   Weight:      Height:       General: alert, cooperative, appears stated age and no distress Lochia: appropriate Uterine Fundus: firm Incision: N/A DVT Evaluation: No evidence of DVT seen on physical exam.  Labs: Lab Results   Component Value Date   WBC 12.1 (H) 07/05/2017   HGB 9.8 (L) 07/05/2017   HCT 29.6 (L) 07/05/2017   MCV 82.5 07/05/2017   PLT 178 07/05/2017   CMP Latest Ref Rng & Units 05/22/2017  Glucose 65 - 99 mg/dL 96  BUN 6 - 20 mg/dL 8  Creatinine 0.86 - 5.78 mg/dL 4.69  Sodium 629 - 528 mmol/L 136  Potassium 3.5 - 5.1 mmol/L 3.3(L)  Chloride 101 - 111 mmol/L 107  CO2 22 - 32 mmol/L 23  Calcium 8.9 - 10.3 mg/dL 8.3(L)  Total Protein 6.5 - 8.1 g/dL 6.4(L)  Total Bilirubin 0.3 - 1.2 mg/dL 0.4  Alkaline Phos 38 - 126 U/L 91  AST 15 - 41 U/L 13(L)  ALT 14 - 54 U/L 9(L)    Discharge instruction: per After Visit Summary and "Baby and Me Booklet".  After visit meds:  Allergies  as of 07/08/2017      Reactions   Penicillins Rash, Other (See Comments)   Has patient had a PCN reaction causing immediate rash, facial/tongue/throat swelling, SOB or lightheadedness with hypotension: No Has patient had a PCN reaction causing severe rash involving mucus membranes or skin necrosis: No Has patient had a PCN reaction that required hospitalization: No Has patient had a PCN reaction occurring within the last 10 years: No If all of the above answers are "NO", then may proceed with Cephalosporin use.      Medication List    TAKE these medications   ibuprofen 600 MG tablet Commonly known as:  ADVIL,MOTRIN Take 1 tablet (600 mg total) by mouth every 6 (six) hours.   PRENATAL GUMMIES/DHA & FA 0.4-32.5 MG Chew Chew 2 each by mouth daily.   senna-docusate 8.6-50 MG tablet Commonly known as:  Senokot-S Take 2 tablets by mouth at bedtime as needed for mild constipation.            Discharge Care Instructions        Start     Ordered   07/07/17 0000  ibuprofen (ADVIL,MOTRIN) 600 MG tablet  Every 6 hours     07/07/17 1000   07/07/17 0000  senna-docusate (SENOKOT-S) 8.6-50 MG tablet  At bedtime PRN     07/07/17 1000   07/05/17 0000  OB RESULTS CONSOLE GC/Chlamydia    Comments:  This external  order was created through the Results Console.   07/05/17 1923   07/05/17 0000  OB RESULT CONSOLE Group B Strep    Comments:  This external order was created through the Results Console.   07/05/17 1924      Diet: routine diet  Activity: Advance as tolerated. Pelvic rest for 6 weeks.   Outpatient follow up:4 weeks Follow up Appt: Future Appointments Date Time Provider Department Center  08/09/2017 1:30 PM Cheral Marker, CNM [facility]   Follow up Visit:No Follow-up on file.  Postpartum contraception: Nexplanon while inpatient (now [place] resident per patient)  Newborn Data: Live born female  Birth Weight: 6 lb 13.4 oz (3100 g) APGAR: 7, 9  Baby Feeding: Bottle Disposition:home with mother   07/08/2017 Josephine Igo, Medical Student   OB FELLOW DISCHARGE ATTESTATION  I have seen and examined this patient. I agree with above documentation and have made edits as needed.   Caryl Ada, DO OB Fellow

## 2017-07-09 LAB — TYPE AND SCREEN
ABO/RH(D): A NEG
Antibody Screen: POSITIVE
DAT, IgG: NEGATIVE
UNIT DIVISION: 0
UNIT DIVISION: 0

## 2017-07-09 LAB — BPAM RBC
BLOOD PRODUCT EXPIRATION DATE: 201810172359
Blood Product Expiration Date: 201810172359
UNIT TYPE AND RH: 9500
Unit Type and Rh: 9500

## 2017-08-09 ENCOUNTER — Ambulatory Visit (INDEPENDENT_AMBULATORY_CARE_PROVIDER_SITE_OTHER): Payer: Medicaid Other | Admitting: Women's Health

## 2017-08-09 ENCOUNTER — Encounter: Payer: Self-pay | Admitting: Women's Health

## 2017-08-09 DIAGNOSIS — A599 Trichomoniasis, unspecified: Secondary | ICD-10-CM

## 2017-08-09 DIAGNOSIS — Z8759 Personal history of other complications of pregnancy, childbirth and the puerperium: Secondary | ICD-10-CM

## 2017-08-09 DIAGNOSIS — O0990 Supervision of high risk pregnancy, unspecified, unspecified trimester: Secondary | ICD-10-CM

## 2017-08-09 LAB — POCT WET PREP (WET MOUNT)
Clue Cells Wet Prep Whiff POC: NEGATIVE
Trichomonas Wet Prep HPF POC: ABSENT

## 2017-08-09 NOTE — Patient Instructions (Signed)
Condoms always to prevent STDs  Etonogestrel implant What is this medicine? ETONOGESTREL (et oh noe JES trel) is a contraceptive (birth control) device. It is used to prevent pregnancy. It can be used for up to 3 years. This medicine may be used for other purposes; ask your health care provider or pharmacist if you have questions. COMMON BRAND NAME(S): Implanon, Nexplanon What should I tell my health care provider before I take this medicine? They need to know if you have any of these conditions: -abnormal vaginal bleeding -blood vessel disease or blood clots -cancer of the breast, cervix, or liver -depression -diabetes -gallbladder disease -headaches -heart disease or recent heart attack -high blood pressure -high cholesterol -kidney disease -liver disease -renal disease -seizures -tobacco smoker -an unusual or allergic reaction to etonogestrel, other hormones, anesthetics or antiseptics, medicines, foods, dyes, or preservatives -pregnant or trying to get pregnant -breast-feeding How should I use this medicine? This device is inserted just under the skin on the inner side of your upper arm by a health care professional. Talk to your pediatrician regarding the use of this medicine in children. Special care may be needed. Overdosage: If you think you have taken too much of this medicine contact a poison control center or emergency room at once. NOTE: This medicine is only for you. Do not share this medicine with others. What if I miss a dose? This does not apply. What may interact with this medicine? Do not take this medicine with any of the following medications: -amprenavir -bosentan -fosamprenavir This medicine may also interact with the following medications: -barbiturate medicines for inducing sleep or treating seizures -certain medicines for fungal infections like ketoconazole and itraconazole -grapefruit juice -griseofulvin -medicines to treat seizures like  carbamazepine, felbamate, oxcarbazepine, phenytoin, topiramate -modafinil -phenylbutazone -rifampin -rufinamide -some medicines to treat HIV infection like atazanavir, indinavir, lopinavir, nelfinavir, tipranavir, ritonavir -St. John's wort This list may not describe all possible interactions. Give your health care provider a list of all the medicines, herbs, non-prescription drugs, or dietary supplements you use. Also tell them if you smoke, drink alcohol, or use illegal drugs. Some items may interact with your medicine. What should I watch for while using this medicine? This product does not protect you against HIV infection (AIDS) or other sexually transmitted diseases. You should be able to feel the implant by pressing your fingertips over the skin where it was inserted. Contact your doctor if you cannot feel the implant, and use a non-hormonal birth control method (such as condoms) until your doctor confirms that the implant is in place. If you feel that the implant may have broken or become bent while in your arm, contact your healthcare provider. What side effects may I notice from receiving this medicine? Side effects that you should report to your doctor or health care professional as soon as possible: -allergic reactions like skin rash, itching or hives, swelling of the face, lips, or tongue -breast lumps -changes in emotions or moods -depressed mood -heavy or prolonged menstrual bleeding -pain, irritation, swelling, or bruising at the insertion site -scar at site of insertion -signs of infection at the insertion site such as fever, and skin redness, pain or discharge -signs of pregnancy -signs and symptoms of a blood clot such as breathing problems; changes in vision; chest pain; severe, sudden headache; pain, swelling, warmth in the leg; trouble speaking; sudden numbness or weakness of the face, arm or leg -signs and symptoms of liver injury like dark yellow or brown urine; general  ill feeling or flu-like symptoms; light-colored stools; loss of appetite; nausea; right upper belly pain; unusually weak or tired; yellowing of the eyes or skin -unusual vaginal bleeding, discharge -signs and symptoms of a stroke like changes in vision; confusion; trouble speaking or understanding; severe headaches; sudden numbness or weakness of the face, arm or leg; trouble walking; dizziness; loss of balance or coordination Side effects that usually do not require medical attention (report to your doctor or health care professional if they continue or are bothersome): -acne -back pain -breast pain -changes in weight -dizziness -general ill feeling or flu-like symptoms -headache -irregular menstrual bleeding -nausea -sore throat -vaginal irritation or inflammation This list may not describe all possible side effects. Call your doctor for medical advice about side effects. You may report side effects to FDA at 1-800-FDA-1088. Where should I keep my medicine? This drug is given in a hospital or clinic and will not be stored at home. NOTE: This sheet is a summary. It may not cover all possible information. If you have questions about this medicine, talk to your doctor, pharmacist, or health care provider.  2018 Elsevier/Gold Standard (2016-04-16 11:19:22)

## 2017-08-09 NOTE — Progress Notes (Signed)
POSTPARTUM VISIT Patient name: April Young MRN [id number]  Date of birth: [birth year] Chief Complaint:   Postpartum Care  History of Present Illness:   April Young is a 21 y.o. G69P1001 African American female being seen today for a postpartum visit. She is 4 weeks postpartum following a spontaneous vaginal delivery at 40.1 gestational weeks after IOL for IUGR. Baby weighed 6lb13oz. Anesthesia: none. I have fully reviewed the prenatal and intrapartum course. Pregnancy complicated by late and limited prenatal care, anemia, pyelonephritis/sepsis-noncompliant w/ suppression therapy during pregnancy, IUGR-noncompliant w/ coming for visits/antenatal testing, trichomonas-noncompliant w/ tx during pregnancy- received IV Flagyl in hospital. Postpartum course has been uncomplicated. Bleeding thin lochia. Bowel function is normal. Bladder function is normal.  Patient is not sexually active. Last sexual activity: prior to birth of baby.  Contraception method is Nexplanon placed in hospital prior to d/c> [place] resident [place] Postpartum Depression Screening: negative. Score 0.   Last pap <21yo.  Results were n/a .  Patient's last menstrual period was 09/28/2016 (exact date).  Baby's course has been uncomplicated. Baby is feeding by bottle.  Review of Systems:   Pertinent items are noted in HPI Denies Abnormal vaginal discharge w/ itching/odor/irritation, headaches, visual changes, shortness of breath, chest pain, abdominal pain, severe nausea/vomiting, or problems with urination or bowel movements. Pertinent History Reviewed:  Reviewed past medical,surgical, obstetrical and family history.  Reviewed problem list, medications and allergies. OB History  Gravida Para Term Preterm AB Living  1 1 1  0 0 1  SAB TAB Ectopic Multiple Live Births  0     0 1    # Outcome Date GA Lbr Len/2nd Weight Sex Delivery Anes PTL Lv  1 Term 07/06/17 [redacted]w[redacted]d 02:57 / 00:17 6 lb 13.4 oz (3.1 kg) F  Vag-Spont Local  LIV     Birth Comments: WNL     Physical Assessment:   Vitals:   08/09/17 1340  BP: (!) 92/50  Pulse: 94  SpO2: 100%  Weight: 117 lb (53.1 kg)  Height: 5\' 8"  (1.727 m)  Body mass index is 17.79 kg/m.       Physical Examination:   General appearance: alert, well appearing, and in no distress  Mental status: alert, oriented to person, place, and time  Skin: warm & dry   Cardiovascular: normal heart rate noted   Respiratory: normal respiratory effort, no distress   Breasts: deferred, no complaints   Abdomen: soft, non-tender   Pelvic: VULVA: normal appearing vulva with no masses, tenderness or lesions, VAGINA: normal appearing vagina with normal color and discharge, no lesions, CERVIX: normal appearing cervix without discharge or lesions, UTERUS: uterus is normal size, shape, consistency and nontender  Rectal: No hemorrhoids  Extremities: No edema       Results for orders placed or performed in visit on 08/09/17 (from the past 24 hour(s))  POCT Wet Prep [facility] [place])   Collection Time: 08/09/17  2:04 PM  Result Value Ref Range   Source Wet Prep POC vaginal    WBC, Wet Prep HPF POC few    Bacteria Wet Prep HPF POC None (A) Few   BACTERIA WET PREP MORPHOLOGY POC     Clue Cells Wet Prep HPF POC None None   Clue Cells Wet Prep Whiff POC Negative Whiff    Yeast Wet Prep HPF POC None    KOH Wet Prep POC     Trichomonas Wet Prep HPF POC Absent Absent    Assessment & Plan:  1)  Postpartum exam 2) 4 wks s/p SVB after IOL for IUGR 3) Bottlefeeding 4) Depression screening 5) Contraception counseling, Nexplanon was placed in hospital 6) Trichomonas POC> neg  Follow-up: Return for @ 22yo for , Pap & physical.   Orders Placed This Encounter  Procedures  . POCT Principal FinancialWet Prep ([address] [place])    Marge DuncansBooker, Janeth Terry Randall CNM, [facility]NP-BC 08/09/2017 2:05 PM

## 2017-09-06 ENCOUNTER — Emergency Department (HOSPITAL_COMMUNITY)
Admission: EM | Admit: 2017-09-06 | Discharge: 2017-09-06 | Disposition: A | Discharge status: Home / Self Care | Payer: Medicaid Other | Attending: Emergency Medicine | Admitting: Emergency Medicine

## 2017-09-06 ENCOUNTER — Other Ambulatory Visit: Payer: Self-pay

## 2017-09-06 ENCOUNTER — Encounter (HOSPITAL_COMMUNITY): Payer: Self-pay

## 2017-09-06 ENCOUNTER — Emergency Department (HOSPITAL_COMMUNITY): Payer: Medicaid Other

## 2017-09-06 DIAGNOSIS — E876 Hypokalemia: Secondary | ICD-10-CM | POA: Diagnosis not present

## 2017-09-06 DIAGNOSIS — J069 Acute upper respiratory infection, unspecified: Secondary | ICD-10-CM

## 2017-09-06 DIAGNOSIS — R05 Cough: Secondary | ICD-10-CM | POA: Diagnosis present

## 2017-09-06 DIAGNOSIS — Z79899 Other long term (current) drug therapy: Secondary | ICD-10-CM | POA: Diagnosis not present

## 2017-09-06 DIAGNOSIS — B9789 Other viral agents as the cause of diseases classified elsewhere: Secondary | ICD-10-CM | POA: Diagnosis not present

## 2017-09-06 DIAGNOSIS — T39395A Adverse effect of other nonsteroidal anti-inflammatory drugs [NSAID], initial encounter: Secondary | ICD-10-CM | POA: Diagnosis not present

## 2017-09-06 DIAGNOSIS — N390 Urinary tract infection, site not specified: Secondary | ICD-10-CM | POA: Insufficient documentation

## 2017-09-06 DIAGNOSIS — E860 Dehydration: Secondary | ICD-10-CM | POA: Diagnosis not present

## 2017-09-06 DIAGNOSIS — K2960 Other gastritis without bleeding: Secondary | ICD-10-CM

## 2017-09-06 LAB — I-STAT BETA HCG BLOOD, ED (MC, WL, AP ONLY): I-stat hCG, quantitative: <5 m[IU]/mL (ref <5)

## 2017-09-06 LAB — CBC
HCT: 35.1 % — ABNORMAL LOW (ref 36.0–46.0)
Hemoglobin: 11.3 g/dL — ABNORMAL LOW (ref 12.0–15.0)
MCH: 25.6 pg — AB (ref 26.0–34.0)
MCHC: 32.2 g/dL (ref 30.0–36.0)
MCV: 79.4 fL (ref 78.0–100.0)
PLATELETS: 208 10*3/uL (ref 150–400)
RBC: 4.42 MIL/uL (ref 3.87–5.11)
RDW: 13.8 % (ref 11.5–15.5)
WBC: 13.7 10*3/uL — ABNORMAL HIGH (ref 4.0–10.5)

## 2017-09-06 LAB — COMPREHENSIVE METABOLIC PANEL
ALT: 16 U/L (ref 14–54)
ANION GAP: 7 (ref 5–15)
AST: 21 U/L (ref 15–41)
Albumin: 3.8 g/dL (ref 3.5–5.0)
Alkaline Phosphatase: 87 U/L (ref 38–126)
BUN: 9 mg/dL (ref 6–20)
CALCIUM: 9.1 mg/dL (ref 8.9–10.3)
CO2: 23 mmol/L (ref 22–32)
CREATININE: 1.01 mg/dL — AB (ref 0.44–1.00)
Chloride: 105 mmol/L (ref 101–111)
GFR calc non Af Amer: >60 mL/min (ref >60)
GLUCOSE: 101 mg/dL — AB (ref 65–99)
POTASSIUM: 3.1 mmol/L — AB (ref 3.5–5.1)
SODIUM: 135 mmol/L (ref 135–145)
Total Bilirubin: 0.9 mg/dL (ref 0.3–1.2)
Total Protein: 8.2 g/dL — ABNORMAL HIGH (ref 6.5–8.1)

## 2017-09-06 LAB — URINALYSIS, ROUTINE W REFLEX MICROSCOPIC
Bilirubin Urine: NEGATIVE
Glucose, UA: NEGATIVE mg/dL
Ketones, ur: 80 mg/dL — AB
Nitrite: POSITIVE — AB
PH: 6 (ref 5.0–8.0)
Protein, ur: 30 mg/dL — AB
Specific Gravity, Urine: 1.017 (ref 1.005–1.030)

## 2017-09-06 LAB — LIPASE, BLOOD: LIPASE: 29 U/L (ref 11–51)

## 2017-09-06 MED ORDER — LEVOFLOXACIN 500 MG PO TABS: 500.0000 mg | ORAL_TABLET | Freq: Every day | ORAL | 0 refills | Status: DC

## 2017-09-06 MED ORDER — FAMOTIDINE 20 MG PO TABS: 20.0000 mg | ORAL_TABLET | Freq: Two times a day (BID) | ORAL | 0 refills | Status: DC

## 2017-09-06 MED ORDER — POTASSIUM CHLORIDE 10 MEQ/100ML IV SOLN
10.0000 meq | INTRAVENOUS | Status: AC
  Administered 2017-09-06 (×2): 10 meq via INTRAVENOUS
  Filled 2017-09-06 (×2): qty 100

## 2017-09-06 MED ORDER — POTASSIUM CHLORIDE CRYS ER 20 MEQ PO TBCR
40.0000 meq | EXTENDED_RELEASE_TABLET | Freq: Once | ORAL | Status: AC
  Administered 2017-09-06: 40 meq via ORAL
  Filled 2017-09-06: qty 2

## 2017-09-06 MED ORDER — ONDANSETRON 4 MG PO TBDP
4.0000 mg | ORAL_TABLET | Freq: Once | ORAL | Status: AC | PRN
  Administered 2017-09-06: 4 mg via ORAL
  Filled 2017-09-06: qty 1

## 2017-09-06 MED ORDER — SODIUM CHLORIDE 0.9 % IV BOLUS (SEPSIS)
1000.0000 mL | Freq: Once | INTRAVENOUS | Status: AC
  Administered 2017-09-06: 1000 mL via INTRAVENOUS

## 2017-09-06 MED ORDER — ACETAMINOPHEN 325 MG PO TABS
650.0000 mg | ORAL_TABLET | Freq: Once | ORAL | Status: AC | PRN
  Administered 2017-09-06: 650 mg via ORAL
  Filled 2017-09-06: qty 2

## 2017-09-06 MED ORDER — GI COCKTAIL ~~LOC~~
30.0000 mL | Freq: Once | ORAL | Status: AC
  Administered 2017-09-06: 30 mL via ORAL
  Filled 2017-09-06: qty 30

## 2017-09-06 MED ORDER — ONDANSETRON HCL 4 MG PO TABS: 4.0000 mg | ORAL_TABLET | Freq: Three times a day (TID) | ORAL | 0 refills | Status: DC | PRN

## 2017-09-06 NOTE — ED Triage Notes (Signed)
Pt presents for evaluation of URI symptoms with cough x 3-4 days with emesis starting yesterday. Pt denies diarrhea. States LLQ pain from coughing.

## 2017-09-06 NOTE — ED Notes (Signed)
Patient transported to X-ray 

## 2017-09-06 NOTE — ED Provider Notes (Signed)
[facility] EMERGENCY DEPARTMENT Provider Note   CSN: [id number] Arrival date & time: 09/06/17  1137     History   Chief Complaint Chief Complaint  Patient presents with  . Emesis  . Cough    HPI April Young is a 21 y.o. female.  HPI Patient presents with nonproductive cough which started 4 days ago.  Associated with fever and chills, nasal congestion and sore throat.  Has been taking ibuprofen.  Developed upper abdominal discomfort and vomiting starting yesterday.  States she had one episode of loose stool.  She denies any back pain.  No urinary frequency, hesitancy, urgency or dysuria.  Denies vaginal complaint. Past Medical History:  Diagnosis Date  . Abdominal pain affecting pregnancy 03/10/2017  . Candida vaginitis 03/10/2017  . Hx of trichomoniasis 11/2016  . Rh negative status during pregnancy   . Sepsis due to Escherichia coli (HCC) 03/2017  . Urinary tract infection 03/10/2017    Patient Active Problem List   Diagnosis Date Noted  . Nexplanon insertion 07/08/2017  . Trichomonas infection 05/13/2017  . History of prior pregnancy with IUGR newborn 04/15/2017  . Bacteremia due to Escherichia coli 03/15/2017  . Pyelonephritis 03/13/2017    Past Surgical History:  Procedure Laterality Date  . SKIN SURGERY     abcess    OB History    Gravida Para Term Preterm AB Living   1 1 1  0 0 1   SAB TAB Ectopic Multiple Live Births   0     0 1       Home Medications    Prior to Admission medications   Medication Sig Start Date End Date Taking? Authorizing Provider  acetaminophen (TYLENOL) 325 MG tablet Take 650 mg by mouth every 6 (six) hours as needed for mild pain.   Yes [provider]  etonogestrel (NEXPLANON) 68 MG IMPL implant 1 each by Subdermal route once.   Yes [provider]  famotidine (PEPCID) 20 MG tablet Take 1 tablet (20 mg total) by mouth 2 (two) times daily. 09/06/17   Loren Racer, MD  levofloxacin  (LEVAQUIN) 500 MG tablet Take 1 tablet (500 mg total) by mouth daily. 09/06/17   Loren Racer, MD  ondansetron (ZOFRAN) 4 MG tablet Take 1 tablet (4 mg total) by mouth every 8 (eight) hours as needed for nausea or vomiting. 09/06/17   Loren Racer, MD  senna-docusate (SENOKOT-S) 8.6-50 MG tablet Take 2 tablets by mouth at bedtime as needed for mild constipation. Patient not taking: Reported on 08/09/2017 07/07/17   Pincus Large, DO    Family History Family History  Problem Relation Age of Onset  . COPD Maternal Grandmother     Social History Social History   Tobacco Use  . Smoking status: Never Smoker  . Smokeless tobacco: Never Used  Substance Use Topics  . Alcohol use: No  . Drug use: No    Comment: states she stopped with pregnancy     Allergies   Penicillins   Review of Systems Review of Systems  Constitutional: Positive for chills, fatigue and fever.  HENT: Positive for congestion and sore throat. Negative for trouble swallowing.   Eyes: Negative for visual disturbance.  Respiratory: Positive for cough. Negative for shortness of breath and wheezing.   Cardiovascular: Negative for chest pain, palpitations and leg swelling.  Gastrointestinal: Positive for abdominal pain, diarrhea, nausea and vomiting. Negative for blood in stool and constipation.  Genitourinary: Negative for dysuria, flank pain, frequency, hematuria,  pelvic pain and vaginal discharge.  Musculoskeletal: Negative for back pain, myalgias, neck pain and neck stiffness.  Skin: Negative for rash and wound.  Neurological: Negative for dizziness, weakness, light-headedness, numbness and headaches.  All other systems reviewed and are negative.    Physical Exam Updated Vital Signs BP 119/84   Pulse 94   Temp 98.7 F (37.1 C) (Oral)   Resp 16   SpO2 94%   Physical Exam  Constitutional: She is oriented to person, place, and time. She appears well-developed and well-nourished.  HENT:  Head:  Normocephalic and atraumatic.  Mouth/Throat: Oropharynx is clear and moist.  No sinus tenderness to percussion.  Mildly erythematous oropharynx without tonsillar hypertrophy or exudates.  Eyes: EOM are normal. Pupils are equal, round, and reactive to light.  Neck: Normal range of motion. Neck supple.  Cardiovascular: Normal rate and regular rhythm. Exam reveals no friction rub.  No murmur heard. Pulmonary/Chest: Effort normal and breath sounds normal. No stridor. No respiratory distress. She has no wheezes. She has no rales. She exhibits no tenderness.  Abdominal: Soft. Bowel sounds are normal. There is tenderness. There is no rebound and no guarding.  Mild epigastric tenderness to palpation.  Musculoskeletal: Normal range of motion. She exhibits no edema or tenderness.  No lower extremity swelling, asymmetry or tenderness.  No midline thoracic or lumbar tenderness.  No CVA tenderness.  Lymphadenopathy:    She has no cervical adenopathy.  Neurological: She is alert and oriented to person, place, and time.  5/5 motor in all extremities.  Sensation fully intact.  Skin: Skin is warm and dry. Capillary refill takes less than 2 seconds. No rash noted. No erythema.  Psychiatric: She has a normal mood and affect. Her behavior is normal.  Nursing note and vitals reviewed.    ED Treatments / Results  Labs (all labs ordered are listed, but only abnormal results are displayed) Labs Reviewed  COMPREHENSIVE METABOLIC PANEL - Abnormal; Notable for the following components:      Result Value   Potassium 3.1 (*)    Glucose, Bld 101 (*)    Creatinine, Ser 1.01 (*)    Total Protein 8.2 (*)    All other components within normal limits  CBC - Abnormal; Notable for the following components:   WBC 13.7 (*)    Hemoglobin 11.3 (*)    HCT 35.1 (*)    MCH 25.6 (*)    All other components within normal limits  URINALYSIS, ROUTINE W REFLEX MICROSCOPIC - Abnormal; Notable for the following components:    Color, Urine AMBER (*)    APPearance CLOUDY (*)    Hgb urine dipstick LARGE (*)    Ketones, ur 80 (*)    Protein, ur 30 (*)    Nitrite POSITIVE (*)    Leukocytes, UA LARGE (*)    Bacteria, UA MANY (*)    Squamous Epithelial / LPF 6-30 (*)    All other components within normal limits  LIPASE, BLOOD  I-STAT BETA HCG BLOOD, ED ([facility], [facility], [facility] ONLY)    EKG  EKG Interpretation None       Radiology Dg Chest 2 View  Result Date: 09/06/2017 CLINICAL DATA:  Dry cough x 4 days.  No chest pain or SOB. EXAM: CHEST  2 VIEW COMPARISON:  03/15/2017 FINDINGS: Artifact degradation posteriorly on the lateral view. AP portable frontal radiograph. Normal heart size and mediastinal contours. No pleural effusion or pneumothorax. Clear lungs. IMPRESSION: No acute cardiopulmonary disease. Electronically Signed  By: Jeronimo GreavesKyle  Talbot M.D.   On: 09/06/2017 16:56    Procedures Procedures (including critical care time)  Medications Ordered in ED Medications  potassium chloride 10 mEq in 100 mL IVPB (10 mEq Intravenous New Bag/Given 09/06/17 1715)  ondansetron (ZOFRAN-ODT) disintegrating tablet 4 mg (4 mg Oral Given 09/06/17 1207)  acetaminophen (TYLENOL) tablet 650 mg (650 mg Oral Given 09/06/17 1207)  sodium chloride 0.9 % bolus 1,000 mL (0 mLs Intravenous Stopped 09/06/17 1752)  potassium chloride SA (K-DUR,KLOR-CON) CR tablet 40 mEq (40 mEq Oral Given 09/06/17 1711)  gi cocktail (Maalox,Lidocaine,Donnatal) (30 mLs Oral Given 09/06/17 1712)     Initial Impression / Assessment and Plan / ED Course  I have reviewed the triage vital signs and the nursing notes.  Pertinent labs & imaging results that were available during my care of the patient were reviewed by me and considered in my medical decision making (see chart for details).     Patient is well-appearing.  Given IV fluids and potassium replacement.  Suspect her upper abdominal pain and vomiting is related to NSAID induced gastritis.  Patient does  have a urinary tract infection.  X-ray without evidence of pneumonia.  Will give Levaquin to cover both urinary tract infection and any possible bacterial cause of her respiratory symptoms.    Final Clinical Impressions(s) / ED Diagnoses   Final diagnoses:  Viral URI with cough  Acute lower UTI  NSAID induced gastritis  Dehydration  Hypokalemia    ED Discharge Orders        Ordered    levofloxacin (LEVAQUIN) 500 MG tablet  Daily     09/06/17 1754    ondansetron (ZOFRAN) 4 MG tablet  Every 8 hours PRN     09/06/17 1754    famotidine (PEPCID) 20 MG tablet  2 times daily     09/06/17 1754       Loren RacerYelverton, Tameah Mihalko, MD 09/06/17 1755

## 2017-09-06 NOTE — ED Notes (Signed)
Patient only able to tolerate drinking half of the GI cocktail, pt does not like the taste and is making her more nauseated

## 2017-09-09 NOTE — Progress Notes (Signed)
This encounter was created in error - please disregard.

## 2017-09-20 ENCOUNTER — Emergency Department (HOSPITAL_COMMUNITY): Payer: Medicaid Other

## 2017-09-20 ENCOUNTER — Encounter (HOSPITAL_COMMUNITY): Payer: Self-pay | Admitting: Emergency Medicine

## 2017-09-20 ENCOUNTER — Other Ambulatory Visit: Payer: Self-pay

## 2017-09-20 ENCOUNTER — Emergency Department (HOSPITAL_COMMUNITY)
Admission: EM | Admit: 2017-09-20 | Discharge: 2017-09-20 | Disposition: A | Discharge status: Home / Self Care | Payer: Medicaid Other | Attending: Emergency Medicine | Admitting: Emergency Medicine

## 2017-09-20 DIAGNOSIS — Z79899 Other long term (current) drug therapy: Secondary | ICD-10-CM | POA: Insufficient documentation

## 2017-09-20 DIAGNOSIS — K5900 Constipation, unspecified: Secondary | ICD-10-CM | POA: Diagnosis not present

## 2017-09-20 LAB — URINALYSIS, ROUTINE W REFLEX MICROSCOPIC
BACTERIA UA: NONE SEEN
Bilirubin Urine: NEGATIVE
Glucose, UA: NEGATIVE mg/dL
Hgb urine dipstick: NEGATIVE
Ketones, ur: NEGATIVE mg/dL
Leukocytes, UA: NEGATIVE
NITRITE: NEGATIVE
Protein, ur: 30 mg/dL — AB
SPECIFIC GRAVITY, URINE: 1.023 (ref 1.005–1.030)
pH: 6 (ref 5.0–8.0)

## 2017-09-20 LAB — PREGNANCY, URINE: Preg Test, Ur: NEGATIVE

## 2017-09-20 NOTE — ED Provider Notes (Signed)
[facility] EMERGENCY DEPARTMENT Provider Note   CSN: [id number] Arrival date & time: 09/20/17  1650     History   Chief Complaint Chief Complaint  Patient presents with  . Constipation    HPI April Young is a 21 y.o. female.  HPI  Pt was seen at 1810. Per pt, c/o gradual onset and persistence of constant "no BM" for the past 1 week. Pt states she took a laxative yesterday without relief. Has been associated with intermittent generalized abd "pains." Denies abd pain at this time. Denies N/V/D, no CP/SOB, no cough, no back pain, no fevers, no dysuria/hematuria.   Past Medical History:  Diagnosis Date  . Abdominal pain affecting pregnancy 03/10/2017  . Candida vaginitis 03/10/2017  . Hx of trichomoniasis 11/2016  . Rh negative status during pregnancy   . Sepsis due to Escherichia coli (HCC) 03/2017  . Urinary tract infection 03/10/2017    Patient Active Problem List   Diagnosis Date Noted  . Nexplanon insertion 07/08/2017  . Trichomonas infection 05/13/2017  . History of prior pregnancy with IUGR newborn 04/15/2017  . Bacteremia due to Escherichia coli 03/15/2017  . Pyelonephritis 03/13/2017    Past Surgical History:  Procedure Laterality Date  . SKIN SURGERY     abcess    OB History    Gravida Para Term Preterm AB Living   1 1 1  0 0 1   SAB TAB Ectopic Multiple Live Births   0     0 1       Home Medications    Prior to Admission medications   Medication Sig Start Date End Date Taking? Authorizing Provider  acetaminophen (TYLENOL) 325 MG tablet Take 650 mg by mouth every 6 (six) hours as needed for mild pain.    [provider]  etonogestrel (NEXPLANON) 68 MG IMPL implant 1 each by Subdermal route once.    [provider]  famotidine (PEPCID) 20 MG tablet Take 1 tablet (20 mg total) by mouth 2 (two) times daily. 09/06/17   Loren Racer, MD  levofloxacin (LEVAQUIN) 500 MG tablet Take 1 tablet (500 mg total) by mouth daily. 09/06/17    Loren Racer, MD  ondansetron (ZOFRAN) 4 MG tablet Take 1 tablet (4 mg total) by mouth every 8 (eight) hours as needed for nausea or vomiting. 09/06/17   Loren Racer, MD  senna-docusate (SENOKOT-S) 8.6-50 MG tablet Take 2 tablets by mouth at bedtime as needed for mild constipation. Patient not taking: Reported on 08/09/2017 07/07/17   Pincus Large, DO    Family History Family History  Problem Relation Age of Onset  . COPD Maternal Grandmother     Social History Social History   Tobacco Use  . Smoking status: Never Smoker  . Smokeless tobacco: Never Used  Substance Use Topics  . Alcohol use: No  . Drug use: No    Comment: states she stopped with pregnancy     Allergies   Penicillins   Review of Systems Review of Systems ROS: Statement: All systems negative except as marked or noted in the HPI; Constitutional: Negative for fever and chills. ; ; Eyes: Negative for eye pain, redness and discharge. ; ; ENMT: Negative for ear pain, hoarseness, nasal congestion, sinus pressure and sore throat. ; ; Cardiovascular: Negative for chest pain, palpitations, diaphoresis, dyspnea and peripheral edema. ; ; Respiratory: Negative for cough, wheezing and stridor. ; ; Gastrointestinal: +constipation. Negative for nausea, vomiting, diarrhea, abdominal pain, blood in stool, hematemesis, jaundice and  rectal bleeding. . ; ; Genitourinary: Negative for dysuria, flank pain and hematuria. ; ; Musculoskeletal: Negative for back pain and neck pain. Negative for swelling and trauma.; ; Skin: Negative for pruritus, rash, abrasions, blisters, bruising and skin lesion.; ; Neuro: Negative for headache, lightheadedness and neck stiffness. Negative for weakness, altered level of consciousness, altered mental status, extremity weakness, paresthesias, involuntary movement, seizure and syncope.      Physical Exam Updated Vital Signs BP 125/72 (BP Location: Right Arm)   Pulse 100   Temp 98.5 F (36.9 C)  (Oral)   Resp 16   Ht 5\' 8"  (1.727 m)   Wt 53.1 kg (117 lb)   SpO2 100%   BMI 17.79 kg/m   Physical Exam 1815: Physical examination:  Nursing notes reviewed; Vital signs and O2 SAT reviewed;  Constitutional: Well developed, Well nourished, Well hydrated, In no acute distress; Head:  Normocephalic, atraumatic; Eyes: EOMI, PERRL, No scleral icterus; ENMT: Mouth and pharynx normal, Mucous membranes moist; Neck: Supple, Full range of motion, No lymphadenopathy; Cardiovascular: Regular rate and rhythm, No gallop; Respiratory: Breath sounds clear & equal bilaterally, No wheezes.  Speaking full sentences with ease, Normal respiratory effort/excursion; Chest: Nontender, Movement normal; Abdomen: Soft, Nontender, Nondistended, Normal bowel sounds; Genitourinary: No CVA tenderness; Extremities: Pulses normal, No tenderness, No edema, No calf edema or asymmetry.; Neuro: AA&Ox3, Major CN grossly intact.  Speech clear. No gross focal motor or sensory deficits in extremities. Climbs on and off stretcher easily by herself. Gait steady..; Skin: Color normal, Warm, Dry.   ED Treatments / Results  Labs (all labs ordered are listed, but only abnormal results are displayed)   EKG  EKG Interpretation None       Radiology   Procedures Procedures (including critical care time)  Medications Ordered in ED Medications - No data to display   Initial Impression / Assessment and Plan / ED Course  I have reviewed the triage vital signs and the nursing notes.  Pertinent labs & imaging results that were available during my care of the patient were reviewed by me and considered in my medical decision making (see chart for details).  MDM Reviewed: previous chart, nursing note and vitals Interpretation: labs and x-ray    Results for orders placed or performed during the hospital encounter of 09/20/17  Pregnancy, urine  Result Value Ref Range   Preg Test, Ur NEGATIVE NEGATIVE  Urinalysis, Routine w  reflex microscopic  Result Value Ref Range   Color, Urine YELLOW YELLOW   APPearance HAZY (A) CLEAR   Specific Gravity, Urine 1.023 1.005 - 1.030   pH 6.0 5.0 - 8.0   Glucose, UA NEGATIVE NEGATIVE mg/dL   Hgb urine dipstick NEGATIVE NEGATIVE   Bilirubin Urine NEGATIVE NEGATIVE   Ketones, ur NEGATIVE NEGATIVE mg/dL   Protein, ur 30 (A) NEGATIVE mg/dL   Nitrite NEGATIVE NEGATIVE   Leukocytes, UA NEGATIVE NEGATIVE   RBC / HPF 0-5 0 - 5 RBC/hpf   WBC, UA 0-5 0 - 5 WBC/hpf   Bacteria, UA NONE SEEN NONE SEEN   Squamous Epithelial / LPF 6-30 (A) NONE SEEN   Mucus PRESENT     Dg Abd Acute W/chest Result Date: 09/20/2017 CLINICAL DATA:  Intermittent lower and mid abdominal pain with constipation. EXAM: DG ABDOMEN ACUTE W/ 1V CHEST COMPARISON:  Chest x-ray 09/06/2017 FINDINGS: The lungs are clear without focal pneumonia, edema, pneumothorax or pleural effusion. The cardiopericardial silhouette is within normal limits for size. The visualized bony structures of the  thorax are intact. Upright film shows no evidence for intraperitoneal free air. There is no evidence for gaseous bowel dilation to suggest obstruction. Prominent stool volume noted left colon and rectum. No unexpected abdominopelvic calcification. Visualized bony anatomy is unremarkable. IMPRESSION: 1. No acute cardiopulmonary findings. 2. Prominent stool volume in the left colon. Electronically Signed   By: Kennith CenterEric  Mansell M.D.   On: 09/20/2017 18:58    1915:  XR, UA reassuring. Tx symptomatically at this time. Dx and testing d/w pt.  Questions answered.  Verb understanding, agreeable to d/c home with outpt f/u.   Final Clinical Impressions(s) / ED Diagnoses   Final diagnoses:  None    ED Discharge Orders    None       Samuel JesterMcManus, Yaakov Saindon, DO 09/24/17 1609

## 2017-09-20 NOTE — ED Notes (Signed)
Patient transported to X-ray 

## 2017-09-20 NOTE — ED Triage Notes (Signed)
Patient states she has not had a bowel movement in 1 week. Complains of intermittent abdominal pain x 2 days, no pain at this time.

## 2017-09-20 NOTE — ED Notes (Signed)
Small BM's for one week with no relief per pt. Pt has not tried anything otc for constipation

## 2017-09-20 NOTE — Discharge Instructions (Signed)
Take over the counter laxative (such as miralax, milk of magnesia, senokot) AND a dulcolax suppository (or enema) today and repeat both tomorrow.  Begin to take over the counter stool softener (colace), as directed on packaging, for the next month.  Continue to take your usual prescriptions as previously directed.  Call your regular medical doctor tomorrow to schedule a follow up appointment within the next week.  Return to the Emergency Department immediately if worsening.

## 2017-10-13 IMAGING — CR DG CHEST 2V
2 series · 2 of 2 positions shown · non-contrast
Comparison: Yesterday

CLINICAL DATA: Second trimester pregnancy. Cough and effusions.
Fever.

EXAM:
CHEST  2 VIEW

[chest pa]
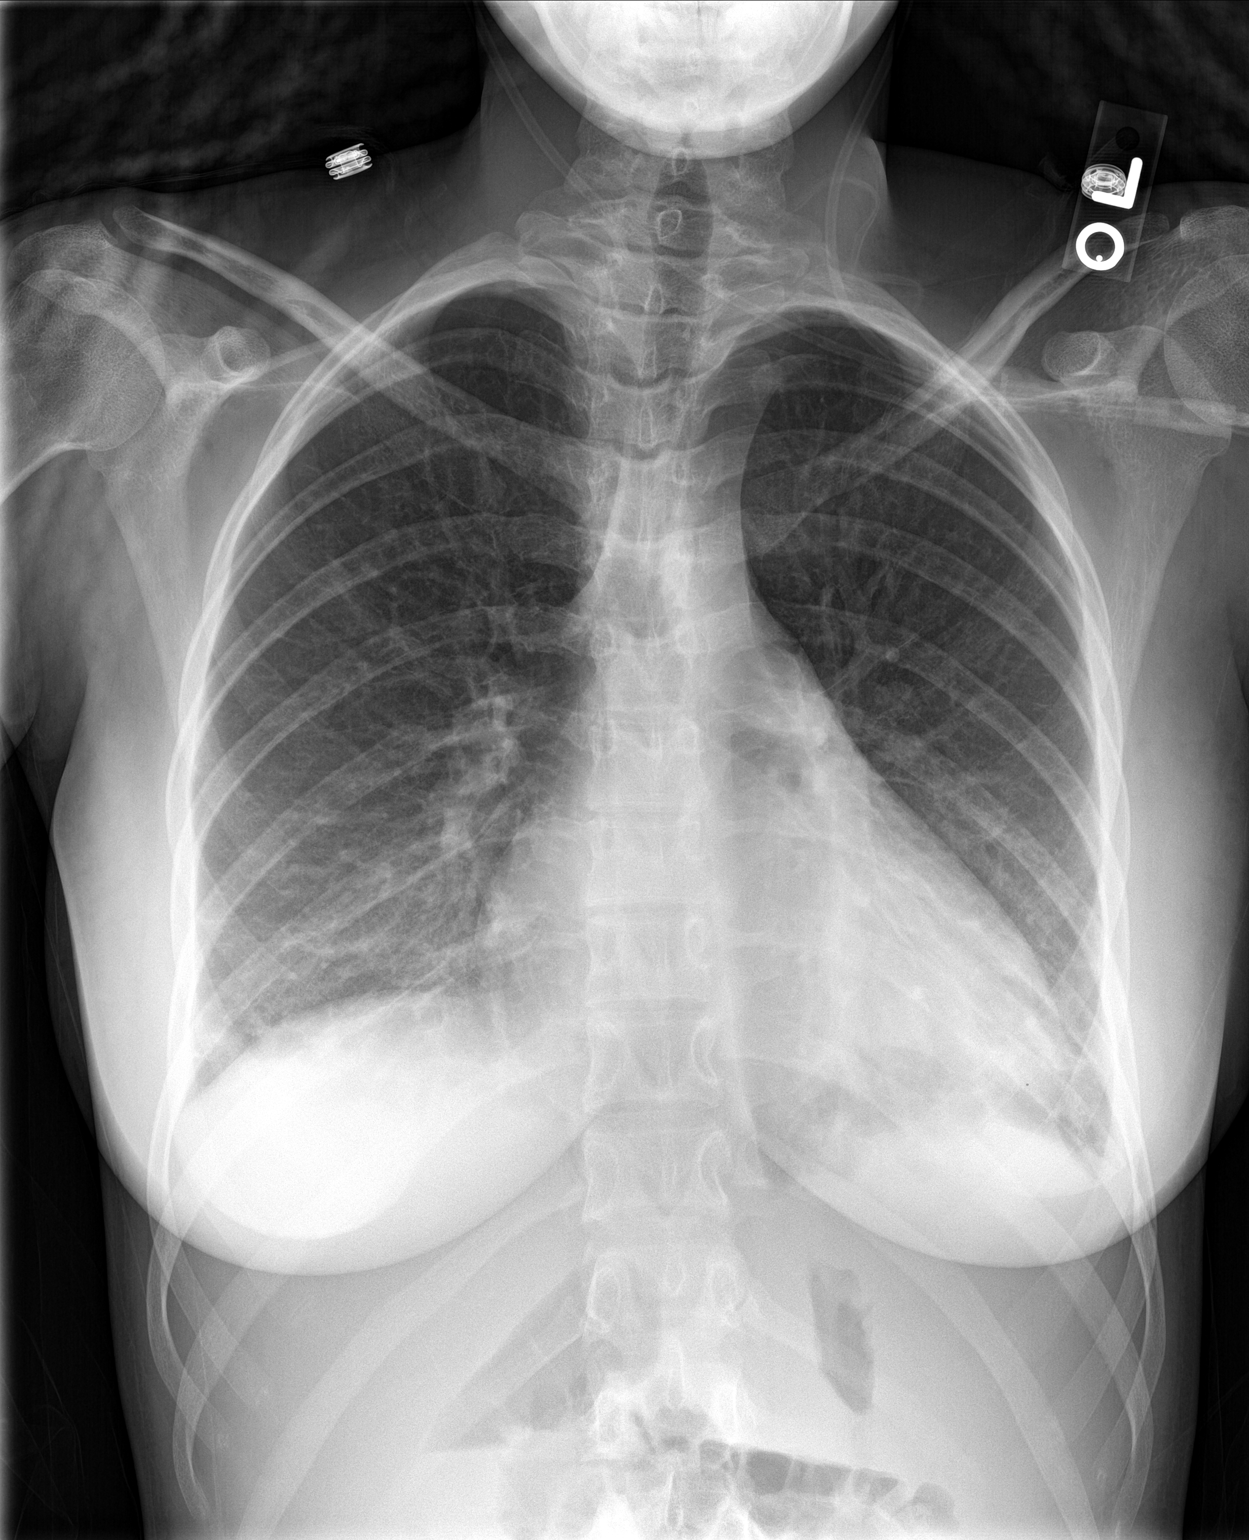

[chest lat]
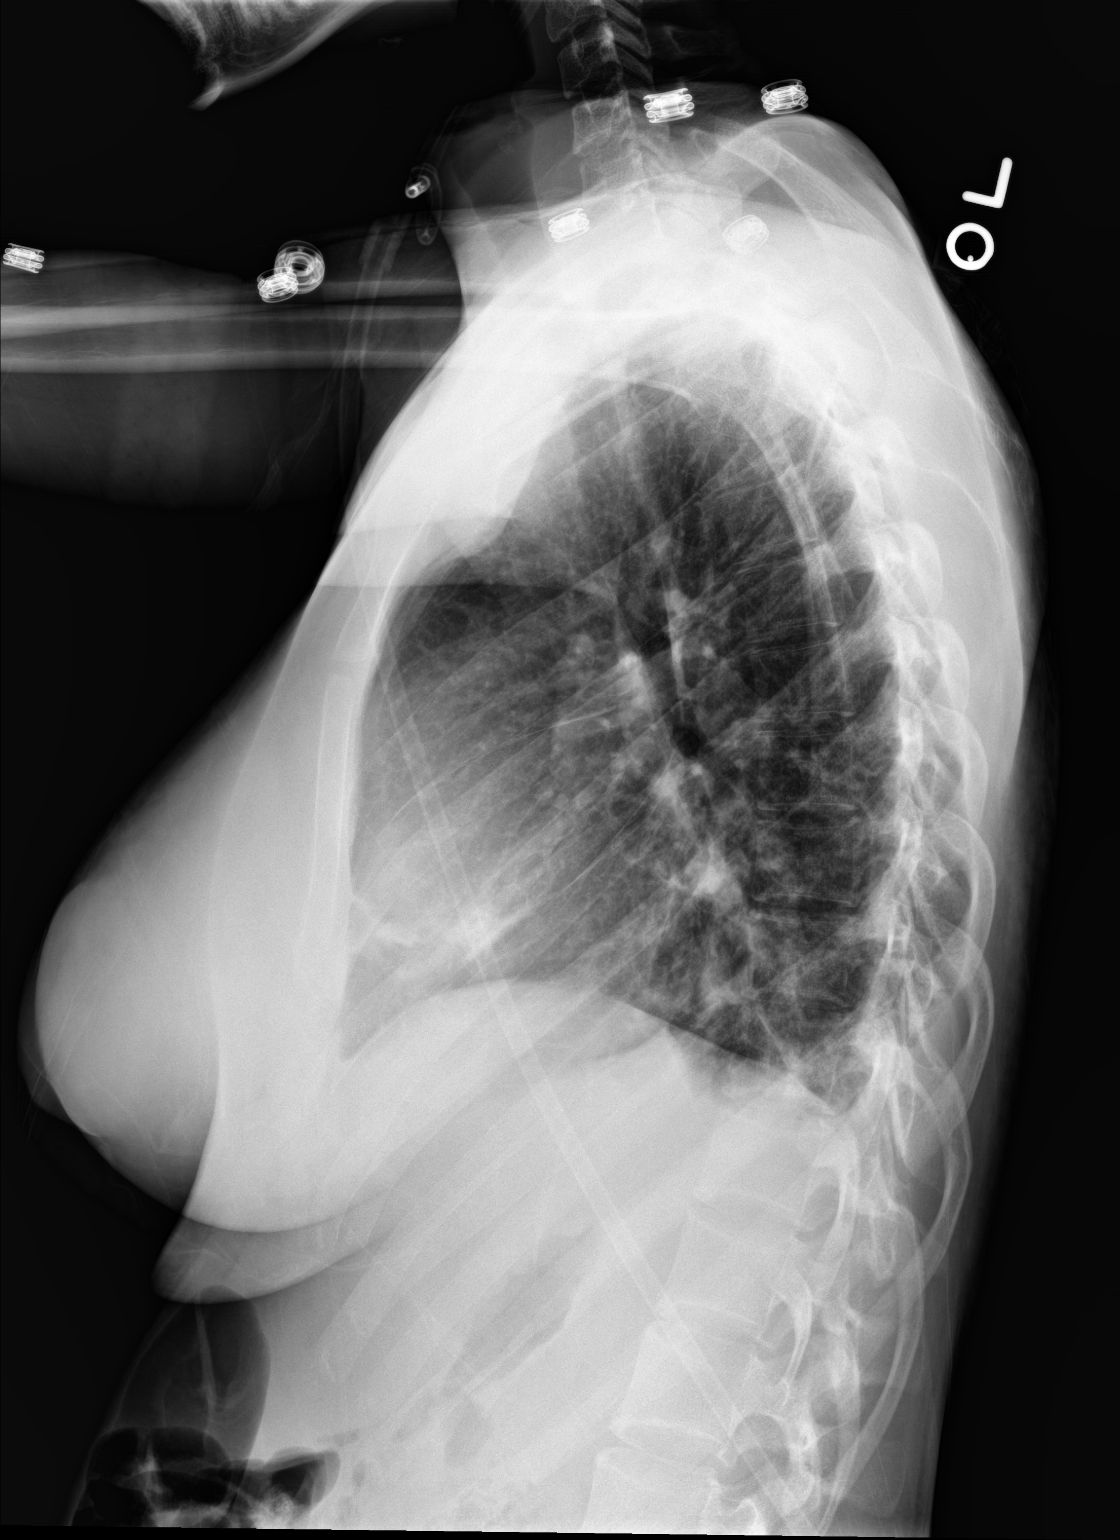

[2 of 2 positions shown; findings below may reference images not displayed]

FINDINGS: Reticulonodular opacities at both bases with probable trace pleural
fluid on the left. This is the appearance of bilateral
bronchopneumonia in this patient with fever. No pulmonary edema. No
pneumothorax. Cardiomegaly.
IMPRESSION: 1. Progressed opacity at the bases favoring bronchopneumonia.
2. Cardiomegaly.

## 2018-04-06 IMAGING — DX DG CHEST 2V
2 series · 2 of 2 positions shown · non-contrast
Comparison: 03/15/2017

CLINICAL DATA: Dry cough x 4 days.  No chest pain or SOB.

EXAM:
CHEST  2 VIEW

[chest lat]
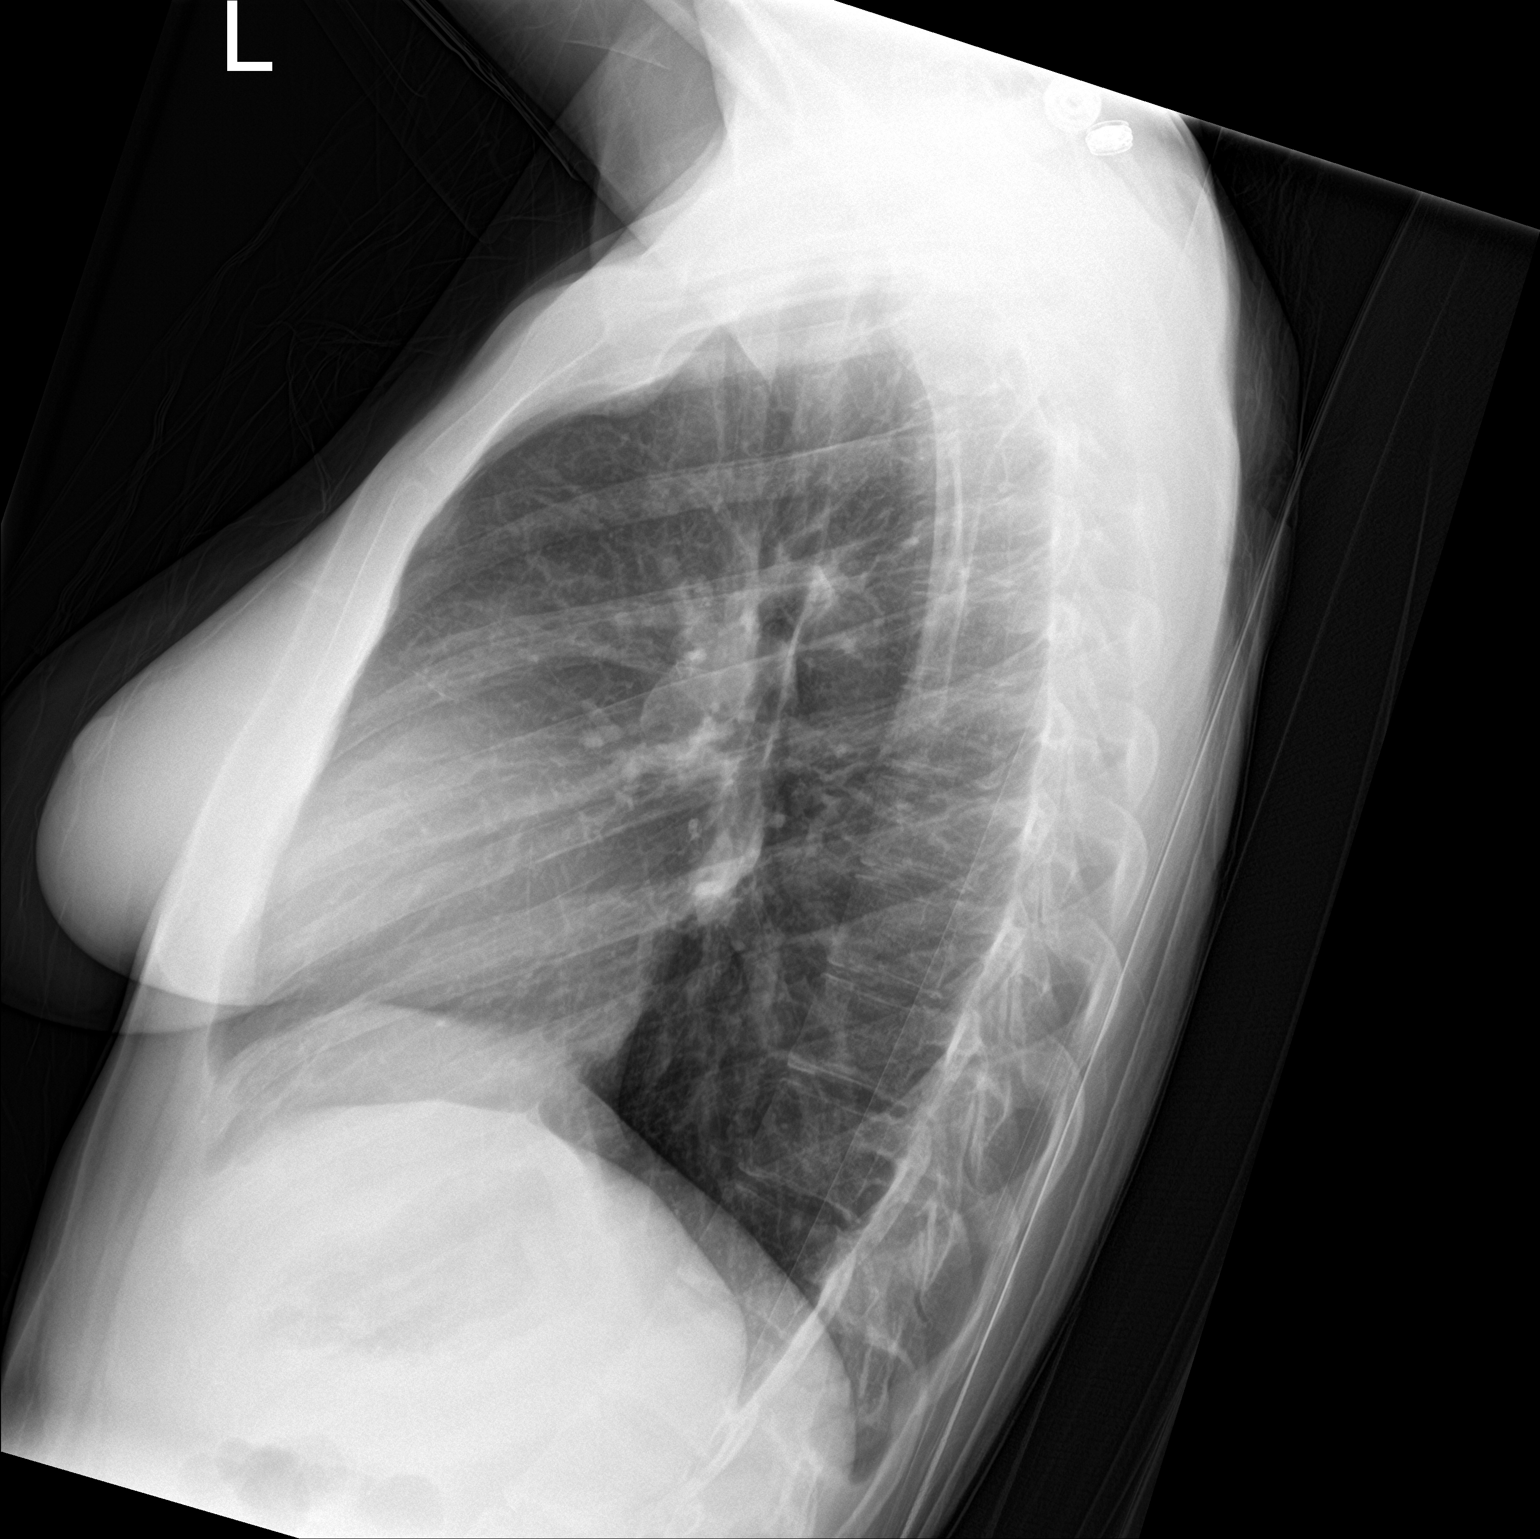

[chest ap]
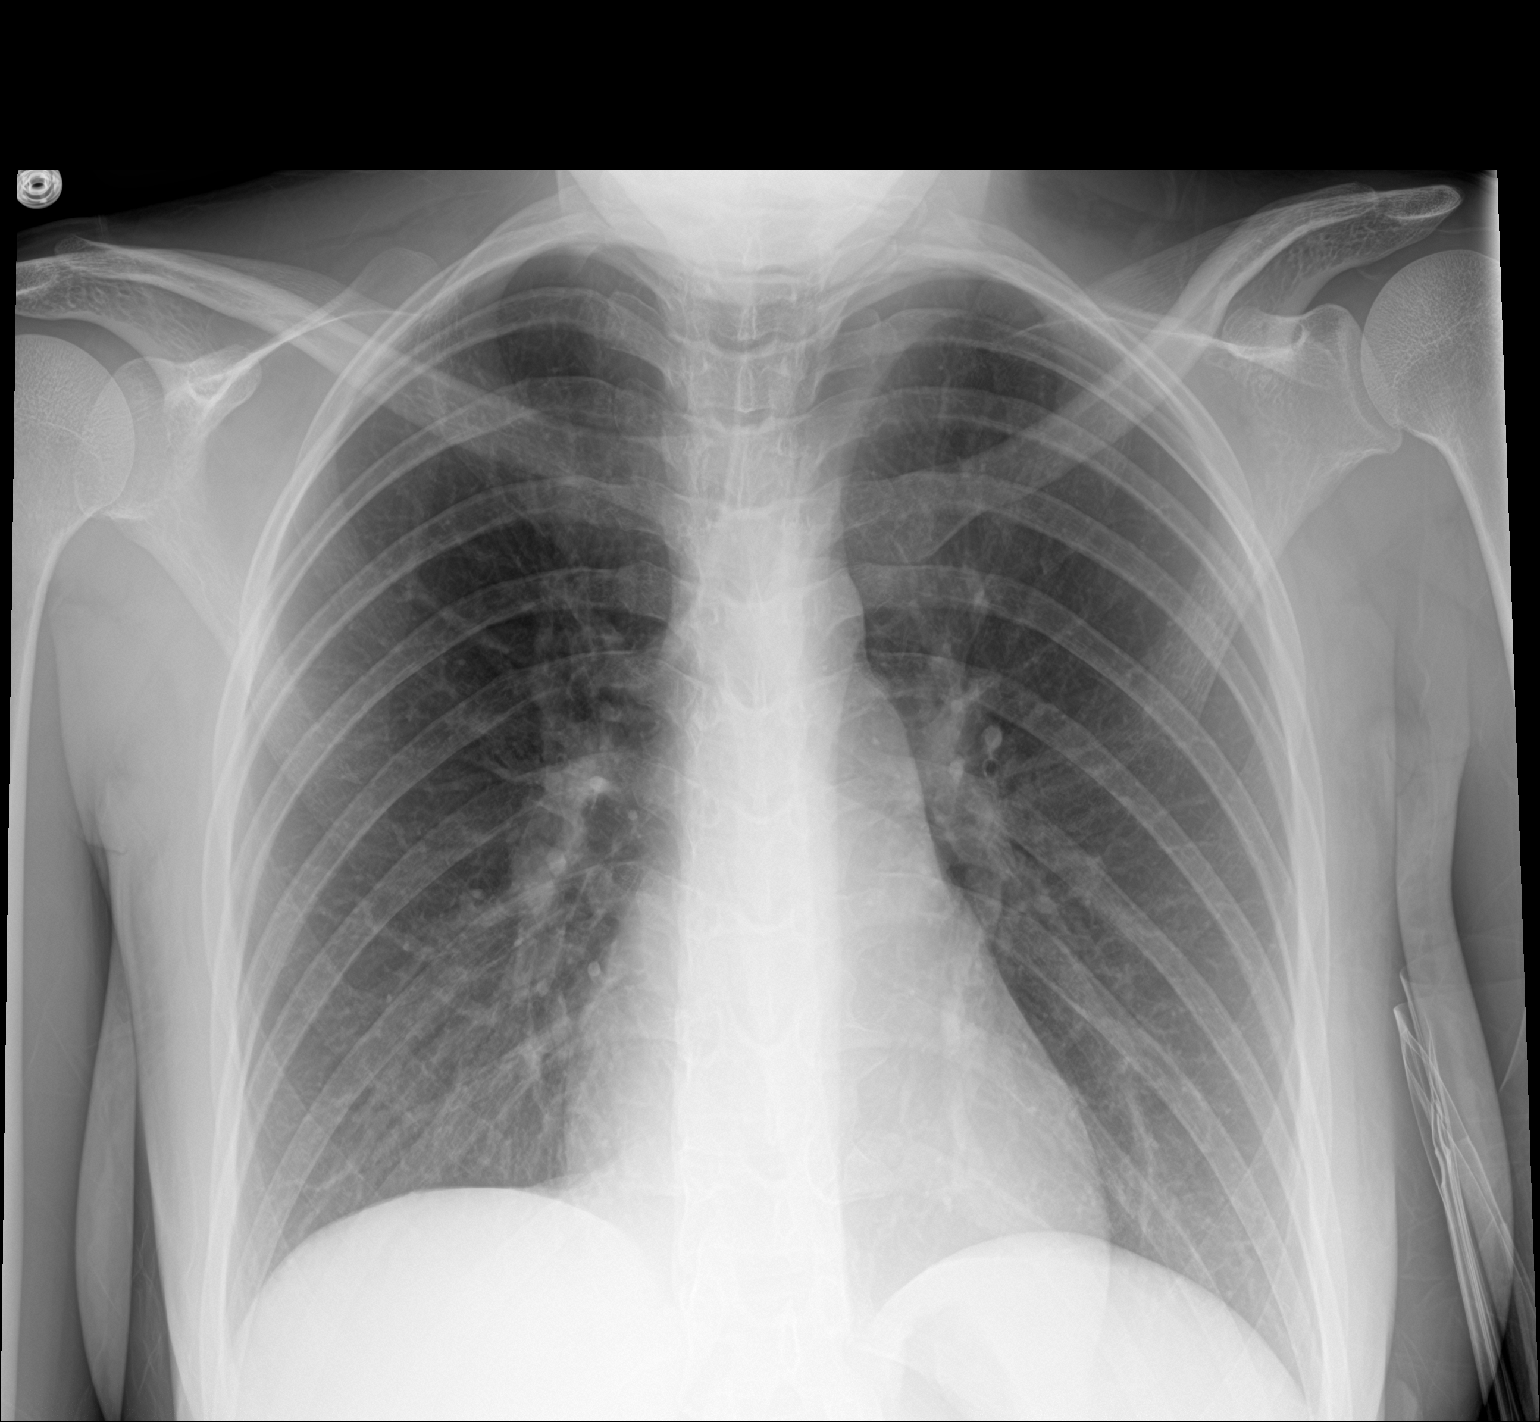

[2 of 2 positions shown; findings below may reference images not displayed]

FINDINGS: Artifact degradation posteriorly on the lateral view. AP portable
frontal radiograph. Normal heart size and mediastinal contours. No
pleural effusion or pneumothorax. Clear lungs.
IMPRESSION: No acute cardiopulmonary disease.

## 2018-10-27 ENCOUNTER — Other Ambulatory Visit: Payer: Medicaid Other | Admitting: Obstetrics & Gynecology

## 2018-11-10 ENCOUNTER — Encounter: Payer: Self-pay | Admitting: Advanced Practice Midwife

## 2018-11-10 ENCOUNTER — Ambulatory Visit: Payer: Medicaid Other | Admitting: Advanced Practice Midwife

## 2018-11-10 VITALS — BP 106/66 | HR 90 | Ht 68.0 in | Wt 103.0 lb

## 2018-11-10 DIAGNOSIS — Z3046 Encounter for surveillance of implantable subdermal contraceptive: Secondary | ICD-10-CM | POA: Insufficient documentation

## 2018-11-10 DIAGNOSIS — Z3049 Encounter for surveillance of other contraceptives: Secondary | ICD-10-CM | POA: Diagnosis not present

## 2018-11-10 MED ORDER — MEDROXYPROGESTERONE ACETATE 150 MG/ML IM SUSP: 150.0000 mg | INTRAMUSCULAR | 3 refills | Status: DC

## 2018-11-10 NOTE — Progress Notes (Signed)
HPI:  April Young 22 y.o. here for Nexplanon removal.  Her future plans for birth control are depo.  Past Medical History: Past Medical History:  Diagnosis Date  . Abdominal pain affecting pregnancy 03/10/2017  . Candida vaginitis 03/10/2017  . Hx of trichomoniasis 11/2016  . Rh negative status during pregnancy   . Sepsis due to Escherichia coli (HCC) 03/2017  . Urinary tract infection 03/10/2017    Past Surgical History: Past Surgical History:  Procedure Laterality Date  . SKIN SURGERY     abcess    Family History: Family History  Problem Relation Age of Onset  . COPD Maternal Grandmother     Social History: Social History   Tobacco Use  . Smoking status: Never Smoker  . Smokeless tobacco: Never Used  Substance Use Topics  . Alcohol use: No  . Drug use: No    Types: Marijuana    Comment: states she stopped with pregnancy    Allergies:  Allergies  Allergen Reactions  . Penicillins Rash and Other (See Comments)    Has patient had a PCN reaction causing immediate rash, facial/tongue/throat swelling, SOB or lightheadedness with hypotension: No Has patient had a PCN reaction causing severe rash involving mucus membranes or skin necrosis: No Has patient had a PCN reaction that required hospitalization: No Has patient had a PCN reaction occurring within the last 10 years: No If all of the above answers are "NO", then may proceed with Cephalosporin use.    Meds: (Not in a hospital admission)     Patient given informed consent for removal of her Nexplanon, time out was performed.  Signed copy in the chart.  Appropriate time out taken. Implanon site identified.  Area prepped in usual sterile fashon. One cc of 1% lidocaine was used to anesthetize the area at the distal end of the implant. A small stab incision was made right beside the implant on the distal portion.  The Nexplanon rod was grasped using hemostats and removed without difficulty.  There was less than 3 cc  blood loss. There were no complications.  A small amount of antibiotic ointment and steri-strips were applied over the small incision.  A pressure bandage was applied to reduce any bruising.  The patient tolerated the procedure well and was given post procedure instructions.

## 2018-11-11 ENCOUNTER — Ambulatory Visit: Payer: Medicaid Other

## 2018-11-11 ENCOUNTER — Ambulatory Visit (INDEPENDENT_AMBULATORY_CARE_PROVIDER_SITE_OTHER): Payer: Medicaid Other

## 2018-11-11 VITALS — Temp 98.3°F | Ht 68.0 in | Wt 105.0 lb

## 2018-11-11 DIAGNOSIS — Z3042 Encounter for surveillance of injectable contraceptive: Secondary | ICD-10-CM | POA: Diagnosis not present

## 2018-11-11 DIAGNOSIS — R8290 Unspecified abnormal findings in urine: Secondary | ICD-10-CM

## 2018-11-11 DIAGNOSIS — Z3202 Encounter for pregnancy test, result negative: Secondary | ICD-10-CM

## 2018-11-11 LAB — POCT URINALYSIS DIPSTICK OB
Blood, UA: NEGATIVE
Glucose, UA: NEGATIVE
Ketones, UA: NEGATIVE
Leukocytes, UA: NEGATIVE

## 2018-11-11 LAB — POCT URINE PREGNANCY: Preg Test, Ur: NEGATIVE

## 2018-11-11 MED ORDER — MEDROXYPROGESTERONE ACETATE 150 MG/ML IM SUSP
150.0000 mg | Freq: Once | INTRAMUSCULAR | Status: AC
  Administered 2018-11-11: 150 mg via INTRAMUSCULAR

## 2018-11-11 NOTE — Addendum Note (Signed)
Addended by: Federico Flake A on: 11/11/2018 01:35 PM   Modules accepted: Orders

## 2018-11-11 NOTE — Progress Notes (Signed)
Pt here for first depo injection 150 mg IM given rt VG. Tolerated well. Return 12 weeks for next injection. Has strong urine odor. +nitrite. Having no symptoms, will send urine for culture.Pad CMA

## 2018-11-14 LAB — URINE CULTURE

## 2019-02-03 ENCOUNTER — Other Ambulatory Visit: Payer: Self-pay

## 2019-02-03 ENCOUNTER — Encounter: Payer: Self-pay | Admitting: Obstetrics & Gynecology

## 2019-02-03 ENCOUNTER — Ambulatory Visit (INDEPENDENT_AMBULATORY_CARE_PROVIDER_SITE_OTHER): Payer: Medicaid Other | Admitting: *Deleted

## 2019-02-03 DIAGNOSIS — Z3042 Encounter for surveillance of injectable contraceptive: Secondary | ICD-10-CM | POA: Diagnosis not present

## 2019-02-03 MED ORDER — MEDROXYPROGESTERONE ACETATE 150 MG/ML IM SUSP
150.0000 mg | Freq: Once | INTRAMUSCULAR | Status: AC
  Administered 2019-02-03: 150 mg via INTRAMUSCULAR

## 2019-02-03 NOTE — Progress Notes (Signed)
Depo Provera 150 mg given IM in right deltoid. Patient tolerated well. Next dose in 12 weeks.  

## 2019-04-27 ENCOUNTER — Telehealth: Payer: Self-pay | Admitting: Adult Health

## 2019-04-27 NOTE — Telephone Encounter (Signed)

## 2019-04-28 ENCOUNTER — Ambulatory Visit (INDEPENDENT_AMBULATORY_CARE_PROVIDER_SITE_OTHER): Payer: Medicaid Other | Admitting: *Deleted

## 2019-04-28 ENCOUNTER — Other Ambulatory Visit: Payer: Self-pay

## 2019-04-28 DIAGNOSIS — Z3042 Encounter for surveillance of injectable contraceptive: Secondary | ICD-10-CM | POA: Diagnosis not present

## 2019-04-28 MED ORDER — MEDROXYPROGESTERONE ACETATE 150 MG/ML IM SUSP
150.0000 mg | Freq: Once | INTRAMUSCULAR | Status: AC
  Administered 2019-04-28: 150 mg via INTRAMUSCULAR

## 2019-04-28 NOTE — Progress Notes (Signed)
Depo Provera 150mg IM given in right deltoid with no complications. Pt to return in 12 weeks for next injection.  

## 2019-07-24 ENCOUNTER — Other Ambulatory Visit: Payer: Self-pay | Admitting: Advanced Practice Midwife

## 2019-07-24 ENCOUNTER — Ambulatory Visit: Payer: Medicaid Other

## 2019-07-26 ENCOUNTER — Emergency Department (HOSPITAL_COMMUNITY): Payer: Medicaid Other

## 2019-07-26 ENCOUNTER — Other Ambulatory Visit: Payer: Self-pay

## 2019-07-26 ENCOUNTER — Encounter (HOSPITAL_COMMUNITY): Payer: Self-pay | Admitting: Emergency Medicine

## 2019-07-26 ENCOUNTER — Emergency Department (HOSPITAL_COMMUNITY)
Admission: EM | Admit: 2019-07-26 | Discharge: 2019-07-26 | Disposition: A | Discharge status: Home / Self Care | Payer: Medicaid Other | Attending: Emergency Medicine | Admitting: Emergency Medicine

## 2019-07-26 DIAGNOSIS — Y999 Unspecified external cause status: Secondary | ICD-10-CM | POA: Diagnosis not present

## 2019-07-26 DIAGNOSIS — S61212A Laceration without foreign body of right middle finger without damage to nail, initial encounter: Secondary | ICD-10-CM | POA: Diagnosis not present

## 2019-07-26 DIAGNOSIS — S61411A Laceration without foreign body of right hand, initial encounter: Secondary | ICD-10-CM | POA: Insufficient documentation

## 2019-07-26 DIAGNOSIS — Y9289 Other specified places as the place of occurrence of the external cause: Secondary | ICD-10-CM | POA: Diagnosis not present

## 2019-07-26 DIAGNOSIS — R58 Hemorrhage, not elsewhere classified: Secondary | ICD-10-CM | POA: Diagnosis not present

## 2019-07-26 DIAGNOSIS — W25XXXA Contact with sharp glass, initial encounter: Secondary | ICD-10-CM | POA: Insufficient documentation

## 2019-07-26 DIAGNOSIS — S51811A Laceration without foreign body of right forearm, initial encounter: Secondary | ICD-10-CM | POA: Diagnosis not present

## 2019-07-26 DIAGNOSIS — Y9389 Activity, other specified: Secondary | ICD-10-CM | POA: Insufficient documentation

## 2019-07-26 DIAGNOSIS — M79641 Pain in right hand: Secondary | ICD-10-CM | POA: Diagnosis not present

## 2019-07-26 DIAGNOSIS — S6991XA Unspecified injury of right wrist, hand and finger(s), initial encounter: Secondary | ICD-10-CM | POA: Diagnosis not present

## 2019-07-26 MED ORDER — LIDOCAINE HCL (PF) 1 % IJ SOLN: 10.0000 mL | Freq: Once | INTRAMUSCULAR | Status: DC

## 2019-07-26 MED ORDER — LIDOCAINE HCL (PF) 1 % IJ SOLN
INTRAMUSCULAR | Status: AC
  Filled 2019-07-26: qty 10

## 2019-07-26 NOTE — ED Provider Notes (Signed)
[facility] EMERGENCY DEPARTMENT Provider Note   CSN: [id number] Arrival date & time: 07/26/19  1146   History   Chief Complaint Chief Complaint  Patient presents with  . Laceration   HPI April Young is a 23 y.o. female with no significant past medical history who presents for evaluation of hand pain and lacerations after hitting a window. Incident occurred just PTA. Tetanus 2 year ago. Denies hitting head, LOC anticoagulation. States she sustained lacerations to her right hand and forearm. Admits to mild pain to laceration site. Denies bleeding, drainage, warmth, redness, swelling, fever, chills, N/V, decreased ROM to extremities. Rates her pain a 4/10. Has not taken anything for pain. Denies additional aggravating or alleviating factors.   History obtained from patient and past medical records. No interpretor was used.     HPI  Past Medical History:  Diagnosis Date  . Abdominal pain affecting pregnancy 03/10/2017  . Candida vaginitis 03/10/2017  . Hx of trichomoniasis 11/2016  . Rh negative status during pregnancy   . Sepsis due to Escherichia coli (HCC) 03/2017  . Urinary tract infection 03/10/2017    Patient Active Problem List   Diagnosis Date Noted  . Encounter for Nexplanon removal 11/10/2018  . Nexplanon insertion 07/08/2017  . Trichomonas infection 05/13/2017  . History of prior pregnancy with IUGR newborn 04/15/2017  . Bacteremia due to Escherichia coli 03/15/2017  . Pyelonephritis 03/13/2017    Past Surgical History:  Procedure Laterality Date  . SKIN SURGERY     abcess     OB History    Gravida  1   Para  1   Term  1   Preterm  0   AB  0   Living  1     SAB  0   TAB      Ectopic      Multiple  0   Live Births  1            Home Medications    Prior to Admission medications   Medication Sig Start Date End Date Taking? Authorizing Provider  acetaminophen (TYLENOL) 325 MG tablet Take 650 mg by mouth every 6 (six) hours as  needed for mild pain.    [provider]  etonogestrel (NEXPLANON) 68 MG IMPL implant 1 each by Subdermal route once.    [provider]  famotidine (PEPCID) 20 MG tablet Take 1 tablet (20 mg total) by mouth 2 (two) times daily. Patient not taking: Reported on 11/11/2018 09/06/17   Loren Racer, MD  medroxyPROGESTERone Acetate 150 MG/ML SUSY INJECT 1 VIAL INTRAMUSCULARLY EVERY 3 MONTHS IN OFFICE. 07/24/19   Jacklyn Shell, CNM    Family History Family History  Problem Relation Age of Onset  . COPD Maternal Grandmother     Social History Social History   Tobacco Use  . Smoking status: Never Smoker  . Smokeless tobacco: Never Used  Substance Use Topics  . Alcohol use: No  . Drug use: No    Types: Marijuana    Comment: states she stopped with pregnancy     Allergies   Penicillins   Review of Systems Review of Systems  Constitutional: Negative.   HENT: Negative.   Respiratory: Negative.   Cardiovascular: Negative.   Gastrointestinal: Negative.   Genitourinary: Negative.   Musculoskeletal: Negative.   Skin: Positive for wound.  Neurological: Negative.   All other systems reviewed and are negative.    Physical Exam Updated Vital Signs BP (!) 128/99 (BP Location: Right  Arm)   Pulse 79   Temp 98.6 F (37 C) (Oral)   Resp 18   Ht  (1.753 m)   Wt 52.2 kg   SpO2 100%   BMI 16.98 kg/m   Physical Exam Vitals signs and nursing note reviewed.  Constitutional:      General: She is not in acute distress.    Appearance: She is well-developed. She is not ill-appearing, toxic-appearing or diaphoretic.  HENT:     Head: Normocephalic and atraumatic.     Nose: Nose normal.     Mouth/Throat:     Mouth: Mucous membranes are moist.     Pharynx: Oropharynx is clear.  Eyes:     Pupils: Pupils are equal, round, and reactive to light.  Neck:     Musculoskeletal: Normal range of motion.  Cardiovascular:     Rate and Rhythm: Normal rate.      Pulses: Normal pulses.     Heart sounds: Normal heart sounds.  Pulmonary:     Effort: Pulmonary effort is normal. No respiratory distress.     Breath sounds: Normal breath sounds.  Abdominal:     General: Bowel sounds are normal. There is no distension.  Musculoskeletal: Normal range of motion.        General: Signs of injury present. No tenderness.     Right elbow: Normal.    Left elbow: Normal.     Right wrist: Normal.     Left wrist: Normal.     Right forearm: She exhibits laceration. She exhibits no tenderness, no bony tenderness, no swelling, no edema and no deformity.     Left forearm: Normal.       Arms:     Right hand: She exhibits laceration. She exhibits normal range of motion, no tenderness, no bony tenderness, normal two-point discrimination, normal capillary refill, no deformity and no swelling. Decreased sensation noted. Normal strength noted.     Left hand: Normal.       Hands:     Right lower leg: No edema.     Left lower leg: No edema.     Comments: Full ROM to bilateral upper extremities with flexion, extension, pronation, supination, grip strength. Compartments soft.  Skin:    General: Skin is warm and dry.     Comments: 1cm jagged laceration over right long finger digit PIP on dorsal aspect, 3 cm laceration to dorsal right forearm. 3 cm laceration to dorsal aspect right hand. No bleeding, drainage, warmth, erythema, edema to lacerations or extremities. No exposed bone, ligament.  Neurological:     General: No focal deficit present.     Mental Status: She is alert.     Sensory: Sensation is intact.     Motor: Motor function is intact.     Coordination: Coordination is intact.     Gait: Gait is intact.     Comments: Intact sensation to sharp and dull. 5/5 strength to BUE    ED Treatments / Results  Labs (all labs ordered are listed, but only abnormal results are displayed) Labs Reviewed - No data to display  EKG None  Radiology Dg Hand Complete  Right  Result Date: 07/26/2019 CLINICAL DATA:  Hand pain, injury EXAM: RIGHT HAND - COMPLETE 3+ VIEW COMPARISON:  None. FINDINGS: There is no evidence of fracture or dislocation. There is no evidence of arthropathy or other focal bone abnormality. Soft tissues are unremarkable. IMPRESSION: Negative. Electronically Signed   By: Charlett Nose M.D.   On:  07/26/2019 12:48    Procedures .Marland KitchenLaceration Repair  Date/Time: 07/26/2019 1:26 PM Performed by: Nettie Elm, PA-C Authorized by: Nettie Elm, PA-C   Consent:    Consent obtained:  Verbal   Consent given by:  Patient   Risks discussed:  Infection, need for additional repair, pain, poor cosmetic result and poor wound healing   Alternatives discussed:  No treatment and delayed treatment Universal protocol:    Procedure explained and questions answered to patient or proxy's satisfaction: yes     Relevant documents present and verified: yes     Test results available and properly labeled: yes     Imaging studies available: yes     Required blood products, implants, devices, and special equipment available: yes     Site/side marked: yes     Immediately prior to procedure, a time out was called: yes     Patient identity confirmed:  Verbally with patient Anesthesia (see MAR for exact dosages):    Anesthesia method:  Local infiltration   Local anesthetic:  Lidocaine 1% w/o epi Laceration details:    Location:  Hand   Hand location:  R hand, dorsum   Length (cm):  3   Depth (mm):  3 Repair type:    Repair type:  Intermediate Pre-procedure details:    Preparation:  Imaging obtained to evaluate for foreign bodies and patient was prepped and draped in usual sterile fashion Exploration:    Hemostasis achieved with:  Direct pressure   Wound exploration: wound explored through full range of motion and entire depth of wound probed and visualized     Wound extent: no foreign bodies/material noted, no muscle damage noted, no nerve  damage noted, no tendon damage noted, no underlying fracture noted and no vascular damage noted     Contaminated: no   Treatment:    Area cleansed with:  Betadine   Amount of cleaning:  Standard   Irrigation solution:  Sterile saline   Irrigation method:  Pressure wash Skin repair:    Repair method:  Sutures   Suture size:  5-0   Suture material:  Prolene   Suture technique:  Simple interrupted   Number of sutures:  3 Approximation:    Approximation:  Close Post-procedure details:    Dressing:  Non-adherent dressing   Patient tolerance of procedure:  Tolerated well, no immediate complications .Marland KitchenLaceration Repair  Date/Time: 07/26/2019 1:27 PM Performed by: Nettie Elm, PA-C Authorized by: Nettie Elm, PA-C   Consent:    Consent obtained:  Verbal   Consent given by:  Patient   Risks discussed:  Infection, need for additional repair, pain, poor cosmetic result and poor wound healing   Alternatives discussed:  No treatment and delayed treatment Universal protocol:    Procedure explained and questions answered to patient or proxy's satisfaction: yes     Relevant documents present and verified: yes     Test results available and properly labeled: yes     Imaging studies available: yes     Required blood products, implants, devices, and special equipment available: yes     Site/side marked: yes     Immediately prior to procedure, a time out was called: yes     Patient identity confirmed:  Verbally with patient Anesthesia (see MAR for exact dosages):    Anesthesia method:  Local infiltration   Local anesthetic:  Lidocaine 1% w/o epi Laceration details:    Location:  Shoulder/arm   Shoulder/arm location:  R lower  arm   Length (cm):  3   Depth (mm):  3 Repair type:    Repair type:  Simple Pre-procedure details:    Preparation:  Patient was prepped and draped in usual sterile fashion and imaging obtained to evaluate for foreign bodies Exploration:    Hemostasis  achieved with:  Direct pressure   Wound exploration: wound explored through full range of motion and entire depth of wound probed and visualized     Wound extent: no foreign bodies/material noted, no muscle damage noted, no nerve damage noted, no tendon damage noted, no underlying fracture noted and no vascular damage noted     Contaminated: no   Skin repair:    Repair method:  Sutures   Suture size:  5-0   Suture material:  Prolene   Suture technique:  Simple interrupted   Number of sutures:  3 Approximation:    Approximation:  Close Post-procedure details:    Dressing:  Non-adherent dressing   Patient tolerance of procedure:  Tolerated well, no immediate complications .Marland Kitchen.Laceration Repair  Date/Time: 07/26/2019 1:28 PM Performed by: Linwood DibblesHenderly, Quinlan Mcfall A, PA-C Authorized by: Linwood DibblesHenderly, Mollee Neer A, PA-C   Consent:    Consent obtained:  Verbal   Consent given by:  Patient   Risks discussed:  Infection, need for additional repair, pain, poor cosmetic result and poor wound healing   Alternatives discussed:  No treatment and delayed treatment Universal protocol:    Procedure explained and questions answered to patient or proxy's satisfaction: yes     Relevant documents present and verified: yes     Test results available and properly labeled: yes     Imaging studies available: yes     Required blood products, implants, devices, and special equipment available: yes     Site/side marked: yes     Immediately prior to procedure, a time out was called: yes     Patient identity confirmed:  Verbally with patient Anesthesia (see MAR for exact dosages):    Anesthesia method:  Local infiltration   Local anesthetic:  Lidocaine 1% w/o epi Laceration details:    Location:  Finger   Finger location:  R long finger   Length (cm):  1   Depth (mm):  2 Repair type:    Repair type:  Simple Pre-procedure details:    Preparation:  Patient was prepped and draped in usual sterile fashion and imaging  obtained to evaluate for foreign bodies Exploration:    Hemostasis achieved with:  Direct pressure   Wound exploration: wound explored through full range of motion and entire depth of wound probed and visualized   Treatment:    Area cleansed with:  Betadine   Amount of cleaning:  Standard   Irrigation solution:  Sterile saline Skin repair:    Repair method:  Tissue adhesive Approximation:    Approximation:  Close Post-procedure details:    Dressing:  Open (no dressing)   Patient tolerance of procedure:  Tolerated well, no immediate complications   (including critical care time)  Medications Ordered in ED Medications  lidocaine (PF) (XYLOCAINE) 1 % injection 10 mL (has no administration in time range)  lidocaine (PF) (XYLOCAINE) 1 % injection (has no administration in time range)   Initial Impression / Assessment and Plan / ED Course  I have reviewed the triage vital signs and the nursing notes.  Pertinent labs & imaging results that were available during my care of the patient were reviewed by me and considered in my medical decision making (  see chart for details).  23 year old female presents for evaluation of lacerations sustained from hitting a glass window with right hand. No septic, no ill appearingFull ROM to extremities, NV intact. No evidence of tendon, ligament, musculature, vascular involvment, Lacerations sutured without difficulty.  Patient did not want suturing laceration on digit.  Discussed with patient cosmetic defect, infection, bleeding.  Patient voiced understanding does not want suturing to her digit.  Provided static splint given location. No active bleeding on exam. Discussed pressure if wound starts to bleed.  See procedure note. No evidence of active infection, foreign body. Imaging without fracture, dislocation. No abx needed at this time. Wound care discussed. Follow up discussed including as well as strict return precautions. Tetanus up to date. VS stable and  appropriate for dc back into police custody.       Final Clinical Impressions(s) / ED Diagnoses   Final diagnoses:  Laceration of right hand without foreign body, initial encounter  Laceration of right middle finger without foreign body without damage to nail, initial encounter    ED Discharge Orders    None       Chord Takahashi A, PA-C 07/26/19 1334    Eber Hong, MD 07/27/19 [phone]

## 2019-07-26 NOTE — ED Notes (Signed)
Wounds dressed by PA

## 2019-07-26 NOTE — Discharge Instructions (Signed)
Need sutures removed in 7 to 10 days.  If you develop chronic bleeding, not stopped by pressure, redness, swelling, warmth, streaking up your hand please seek reevaluation.

## 2019-07-26 NOTE — ED Triage Notes (Signed)
Patient brought in by police officer in custody. States she was banging on a glass window today and has laceration to right hand and right forearm.

## 2019-10-19 ENCOUNTER — Ambulatory Visit: Payer: Medicaid Other

## 2019-11-07 ENCOUNTER — Telehealth: Payer: Self-pay | Admitting: Adult Health

## 2019-11-07 NOTE — Telephone Encounter (Signed)

## 2019-11-08 ENCOUNTER — Other Ambulatory Visit: Payer: Medicaid Other

## 2019-11-08 ENCOUNTER — Ambulatory Visit: Payer: Medicaid Other | Admitting: Adult Health

## 2019-11-09 ENCOUNTER — Telehealth: Payer: Self-pay | Admitting: Advanced Practice Midwife

## 2019-11-09 NOTE — Telephone Encounter (Signed)

## 2019-11-13 ENCOUNTER — Other Ambulatory Visit: Payer: Self-pay

## 2019-11-13 ENCOUNTER — Other Ambulatory Visit (HOSPITAL_COMMUNITY)
Admission: RE | Admit: 2019-11-13 | Discharge: 2019-11-13 | Disposition: A | Discharge status: Home / Self Care | Payer: Medicaid Other | Source: Ambulatory Visit | Attending: Advanced Practice Midwife | Admitting: Advanced Practice Midwife

## 2019-11-13 ENCOUNTER — Other Ambulatory Visit: Payer: Medicaid Other

## 2019-11-13 ENCOUNTER — Ambulatory Visit: Payer: Medicaid Other

## 2019-11-13 ENCOUNTER — Encounter: Payer: Self-pay | Admitting: Advanced Practice Midwife

## 2019-11-13 ENCOUNTER — Ambulatory Visit (INDEPENDENT_AMBULATORY_CARE_PROVIDER_SITE_OTHER): Payer: Medicaid Other | Admitting: Advanced Practice Midwife

## 2019-11-13 VITALS — BP 110/76 | HR 64 | Ht 68.0 in | Wt 100.2 lb

## 2019-11-13 DIAGNOSIS — Z113 Encounter for screening for infections with a predominantly sexual mode of transmission: Secondary | ICD-10-CM

## 2019-11-13 DIAGNOSIS — Z3042 Encounter for surveillance of injectable contraceptive: Secondary | ICD-10-CM | POA: Diagnosis not present

## 2019-11-13 NOTE — Patient Instructions (Signed)
Medroxyprogesterone injection [Contraceptive] What is this medicine? MEDROXYPROGESTERONE (me DROX ee proe JES te rone) contraceptive injections prevent pregnancy. They provide effective birth control for 3 months. Depo-subQ Provera 104 is also used for treating pain related to endometriosis. This medicine may be used for other purposes; ask your health care provider or pharmacist if you have questions. COMMON BRAND NAME(S): Depo-Provera, Depo-subQ Provera 104 What should I tell my health care provider before I take this medicine? They need to know if you have any of these conditions:  frequently drink alcohol  asthma  blood vessel disease or a history of a blood clot in the lungs or legs  bone disease such as osteoporosis  breast cancer  diabetes  eating disorder (anorexia nervosa or bulimia)  high blood pressure  HIV infection or AIDS  kidney disease  liver disease  mental depression  migraine  seizures (convulsions)  stroke  tobacco smoker  vaginal bleeding  an unusual or allergic reaction to medroxyprogesterone, other hormones, medicines, foods, dyes, or preservatives  pregnant or trying to get pregnant  breast-feeding How should I use this medicine? Depo-Provera Contraceptive injection is given into a muscle. Depo-subQ Provera 104 injection is given under the skin. These injections are given by a health care professional. You must not be pregnant before getting an injection. The injection is usually given during the first 5 days after the start of a menstrual period or 6 weeks after delivery of a baby. Talk to your pediatrician regarding the use of this medicine in children. Special care may be needed. These injections have been used in female children who have started having menstrual periods. Overdosage: If you think you have taken too much of this medicine contact a poison control center or emergency room at once. NOTE: This medicine is only for you. Do not  share this medicine with others. What if I miss a dose? Try not to miss a dose. You must get an injection once every 3 months to maintain birth control. If you cannot keep an appointment, call and reschedule it. If you wait longer than 13 weeks between Depo-Provera contraceptive injections or longer than 14 weeks between Depo-subQ Provera 104 injections, you could get pregnant. Use another method for birth control if you miss your appointment. You may also need a pregnancy test before receiving another injection. What may interact with this medicine? Do not take this medicine with any of the following medications:  bosentan This medicine may also interact with the following medications:  aminoglutethimide  antibiotics or medicines for infections, especially rifampin, rifabutin, rifapentine, and griseofulvin  aprepitant  barbiturate medicines such as phenobarbital or primidone  bexarotene  carbamazepine  medicines for seizures like ethotoin, felbamate, oxcarbazepine, phenytoin, topiramate  modafinil  St. John's wort This list may not describe all possible interactions. Give your health care provider a list of all the medicines, herbs, non-prescription drugs, or dietary supplements you use. Also tell them if you smoke, drink alcohol, or use illegal drugs. Some items may interact with your medicine. What should I watch for while using this medicine? This drug does not protect you against HIV infection (AIDS) or other sexually transmitted diseases. Use of this product may cause you to lose calcium from your bones. Loss of calcium may cause weak bones (osteoporosis). Only use this product for more than 2 years if other forms of birth control are not right for you. The longer you use this product for birth control the more likely you will be at risk   for weak bones. Ask your health care professional how you can keep strong bones. You may have a change in bleeding pattern or irregular periods.  Many females stop having periods while taking this drug. If you have received your injections on time, your chance of being pregnant is very low. If you think you may be pregnant, see your health care professional as soon as possible. Tell your health care professional if you want to get pregnant within the next year. The effect of this medicine may last a long time after you get your last injection. What side effects may I notice from receiving this medicine? Side effects that you should report to your doctor or health care professional as soon as possible:  allergic reactions like skin rash, itching or hives, swelling of the face, lips, or tongue  breast tenderness or discharge  breathing problems  changes in vision  depression  feeling faint or lightheaded, falls  fever  pain in the abdomen, chest, groin, or leg  problems with balance, talking, walking  unusually weak or tired  yellowing of the eyes or skin Side effects that usually do not require medical attention (report to your doctor or health care professional if they continue or are bothersome):  acne  fluid retention and swelling  headache  irregular periods, spotting, or absent periods  temporary pain, itching, or skin reaction at site where injected  weight gain This list may not describe all possible side effects. Call your doctor for medical advice about side effects. You may report side effects to FDA at 1-800-FDA-1088. Where should I keep my medicine? This does not apply. The injection will be given to you by a health care professional. NOTE: This sheet is a summary. It may not cover all possible information. If you have questions about this medicine, talk to your doctor, pharmacist, or health care provider.  2020 Elsevier/Gold Standard (2008-10-19 18:37:56)  

## 2019-11-13 NOTE — Progress Notes (Signed)
   GYN VISIT Patient name: April Young MRN [id number]  Date of birth: 08/22/1996 Chief Complaint:   STD screening  History of Present Illness:   April Young is a 24 y.o. G36P1001 African American female being seen today for screening for GC/chlamydia. Has a new sex partner; states she has been using condoms. Doesn't have any symptoms or changes in vag d/c. Last had IC 4 days ago, but started bleeding this morning.   No LMP recorded. Patient has had an injection. The current method of family planning is Depo-Provera injections, but is late on this injection. Last pap hasn't had one yet.  Review of Systems:   Pertinent items are noted in HPI Denies fever/chills, dizziness, headaches, visual disturbances, fatigue, shortness of breath, chest pain, abdominal pain, vomiting, abnormal vaginal discharge/itching/odor/irritation, problems with periods, bowel movements, urination, or intercourse unless otherwise stated above.  Pertinent History Reviewed:  Reviewed past medical,surgical, social, obstetrical and family history.  Reviewed problem list, medications and allergies. Physical Assessment:   Vitals:   11/13/19 0901  BP: 110/76  Pulse: 64  Weight: 100 lb 3.2 oz (45.5 kg)  Height: 5\' 8"  (1.727 m)  Body mass index is 15.24 kg/m.       Physical Examination:   General appearance: alert, well appearing, and in no distress  Mental status: alert, oriented to person, place, and time  Skin: warm & dry   Cardiovascular: normal heart rate noted  Respiratory: normal respiratory effort, no distress  Abdomen: soft, non-tender   Pelvic: normal external genitalia, vulva, vagina, cervix, uterus and adnexa  Extremities: no edema   Chaperone:    No results found for this or any previous visit (from the past 24 hour(s)).  Assessment & Plan:  1) STD screening> GC/chlam collected per pt request; recommended continued regular condom use  2) Reinitiating DMPA> bHcg drawn this  morning, pending; may return for injection (with neg bHcg) even though she had recent IC since pt just started menses today  Meds: No orders of the defined types were placed in this encounter.   No orders of the defined types were placed in this encounter.   Return in about 3 months (around 02/10/2020) for Depo injection; also needs pap/physical .  04/11/2020 [facility] 11/13/2019 9:13 AM

## 2019-11-14 LAB — CERVICOVAGINAL ANCILLARY ONLY
Chlamydia: NEGATIVE
Comment: NEGATIVE
Comment: NEGATIVE
Comment: NORMAL
Neisseria Gonorrhea: NEGATIVE
Trichomonas: NEGATIVE

## 2019-11-14 LAB — BETA HCG QUANT (REF LAB): hCG Quant: <1 m[IU]/mL

## 2019-11-15 ENCOUNTER — Other Ambulatory Visit: Payer: Self-pay

## 2019-11-15 ENCOUNTER — Ambulatory Visit: Payer: Medicaid Other

## 2019-11-16 ENCOUNTER — Ambulatory Visit (INDEPENDENT_AMBULATORY_CARE_PROVIDER_SITE_OTHER): Payer: Medicaid Other

## 2019-11-16 VITALS — Ht 68.0 in | Wt 102.2 lb

## 2019-11-16 DIAGNOSIS — Z3042 Encounter for surveillance of injectable contraceptive: Secondary | ICD-10-CM

## 2019-11-16 MED ORDER — MEDROXYPROGESTERONE ACETATE 150 MG/ML IM SUSP
150.0000 mg | Freq: Once | INTRAMUSCULAR | Status: AC
  Administered 2019-11-16: 150 mg via INTRAMUSCULAR

## 2019-11-16 NOTE — Progress Notes (Signed)
   NURSE VISIT- INJECTION  SUBJECTIVE:  April Young is a 24 y.o. G53P1001 female here for a Depo Provera for contraception/period management. She is a GYN patient. Had beta hcg done 11-13-19 negative.  .OBJECTIVE:  There were no vitals taken for this visit.  Appears well, in no apparent distress  Injection administered in: Right upper quad. gluteus  No orders of the defined types were placed in this encounter.   ASSESSMENT: GYN patient Depo Provera for contraception/period management PLAN: Follow-up: in 11-13 weeks for next Depo   Rennis Petty  11/16/2019 10:32 AM

## 2020-02-12 ENCOUNTER — Other Ambulatory Visit: Payer: Medicaid Other | Admitting: Advanced Practice Midwife

## 2020-08-12 ENCOUNTER — Other Ambulatory Visit (INDEPENDENT_AMBULATORY_CARE_PROVIDER_SITE_OTHER): Payer: Medicaid Other | Admitting: *Deleted

## 2020-08-12 ENCOUNTER — Other Ambulatory Visit (HOSPITAL_COMMUNITY)
Admission: RE | Admit: 2020-08-12 | Discharge: 2020-08-12 | Disposition: A | Discharge status: Home / Self Care | Payer: Medicaid Other | Source: Ambulatory Visit | Attending: Obstetrics & Gynecology | Admitting: Obstetrics & Gynecology

## 2020-08-12 DIAGNOSIS — Z113 Encounter for screening for infections with a predominantly sexual mode of transmission: Secondary | ICD-10-CM | POA: Diagnosis not present

## 2020-08-12 NOTE — Progress Notes (Addendum)
   NURSE VISIT- VAGINITIS/STD/POC  SUBJECTIVE:  April Young is a 24 y.o. G1P1001 [name] here for a vaginal swab for STD screen.  She reports the following symptoms: none for 0 days. Denies abnormal vaginal bleeding, significant pelvic pain, fever, or UTI symptoms.  OBJECTIVE:  There were no vitals taken for this visit.  Appears well, in no apparent distress  ASSESSMENT: Vaginal swab for STD screen  PLAN: Self-collected vaginal probe for Gonorrhea, Chlamydia, Trichomonas sent to lab Treatment: to be determined once results are received Follow-up as needed if symptoms persist/worsen, or new symptoms develop  Annamarie Dawley  08/12/2020 4:04 PM   Chart reviewed for nurse visit. Agree with plan of care.  Cheral Marker, [place] 08/12/2020 4:53 PM

## 2020-08-14 ENCOUNTER — Ambulatory Visit: Payer: Medicaid Other | Admitting: Student

## 2020-08-14 ENCOUNTER — Ambulatory Visit (INDEPENDENT_AMBULATORY_CARE_PROVIDER_SITE_OTHER): Payer: Medicaid Other | Admitting: Advanced Practice Midwife

## 2020-08-14 ENCOUNTER — Encounter: Payer: Self-pay | Admitting: Advanced Practice Midwife

## 2020-08-14 VITALS — BP 98/61 | HR 75 | Ht 69.0 in | Wt 102.6 lb

## 2020-08-14 DIAGNOSIS — Z30013 Encounter for initial prescription of injectable contraceptive: Secondary | ICD-10-CM | POA: Insufficient documentation

## 2020-08-14 DIAGNOSIS — Z3009 Encounter for other general counseling and advice on contraception: Secondary | ICD-10-CM | POA: Diagnosis not present

## 2020-08-14 LAB — POCT URINE PREGNANCY: Preg Test, Ur: NEGATIVE

## 2020-08-14 MED ORDER — MEDROXYPROGESTERONE ACETATE 150 MG/ML IM SUSP: 150.0000 mg | INTRAMUSCULAR | 4 refills | Status: DC

## 2020-08-14 MED ORDER — MEDROXYPROGESTERONE ACETATE 150 MG/ML IM SUSP
150.0000 mg | Freq: Once | INTRAMUSCULAR | Status: AC
  Administered 2020-08-14: 150 mg via INTRAMUSCULAR

## 2020-08-14 NOTE — Progress Notes (Signed)
   GYN VISIT Patient name: April Young MRN [id number]  Date of birth: [birth year] Chief Complaint:   Contraception  History of Present Illness:   April Young is a 24 y.o. G69P1001 African American female being seen today for wanting to restart DMPA. Had last dose 11/16/19 and isn't sure why she didn't return for subsequent dosing. Doesn't desire a pregnancy. Is currently sexually active and uses condoms.     Depression screen PHQ 2/9 01/19/2017  Decreased Interest 1  Down, Depressed, Hopeless 0  PHQ - 2 Score 1  Altered sleeping 1  Tired, decreased energy 1  Change in appetite 1  Feeling bad or failure about yourself  0  Trouble concentrating 0  Moving slowly or fidgety/restless 0  Suicidal thoughts 0  PHQ-9 Score 4    Patient's last menstrual period was 08/09/2020 (exact date). The current method of family planning is starting DMPA today (on menses).  Last pap hasn't had yet.  Review of Systems:   Pertinent items are noted in HPI Denies fever/chills, dizziness, headaches, visual disturbances, fatigue, shortness of breath, chest pain, abdominal pain, vomiting, abnormal vaginal discharge/itching/odor/irritation, problems with periods, bowel movements, urination, or intercourse unless otherwise stated above.  Pertinent History Reviewed:  Reviewed past medical,surgical, social, obstetrical and family history.  Reviewed problem list, medications and allergies. Physical Assessment:   Vitals:   08/14/20 1415  BP: 98/61  Pulse: 75  Weight: 102 lb 9.6 oz (46.5 kg)  Height: 5\' 9"  (1.753 m)  Body mass index is 15.15 kg/m.       Physical Examination:   General appearance: alert, well appearing, and in no distress  Mental status: alert, oriented to person, place, and time  Skin: warm & dry   Cardiovascular: normal heart rate noted  Respiratory: normal respiratory effort, no distress  Abdomen: soft, non-tender   Pelvic: not examined  Extremities: no edema      Results  for orders placed or performed in visit on 08/14/20 (from the past 24 hour(s))  POCT urine pregnancy   Collection Time: 08/14/20  2:13 PM  Result Value Ref Range   Preg Test, Ur Negative Negative    Assessment & Plan:  1) Reinitiating DMPA> 150mg  IM here in office with refills sent to pharmacy  2) Needs Pap> will schedule physical prior to leaving  Meds:  Meds ordered this encounter  Medications  . medroxyPROGESTERone (DEPO-PROVERA) 150 MG/ML injection    Sig: Inject 1 mL (150 mg total) into the muscle every 3 (three) months.    Dispense:  1 mL    Refill:  4    Order Specific Question:   Supervising Provider    Answer:   13/03/21 H [[id number]]    Orders Placed This Encounter  Procedures  . POCT urine pregnancy    Return for Pap & Physical, 1st available.  CNM 08/14/2020 2:45 PM

## 2020-08-14 NOTE — Addendum Note (Signed)
Addended by: Leilani Able, Wlliam Grosso A on: 08/14/2020 02:51 PM   Modules accepted: Orders

## 2020-08-14 NOTE — Patient Instructions (Signed)
Medroxyprogesterone injection [Contraceptive] What is this medicine? MEDROXYPROGESTERONE (me DROX ee proe JES te rone) contraceptive injections prevent pregnancy. They provide effective birth control for 3 months. Depo-subQ Provera 104 is also used for treating pain related to endometriosis. This medicine may be used for other purposes; ask your health care provider or pharmacist if you have questions. COMMON BRAND NAME(S): Depo-Provera, Depo-subQ Provera 104 What should I tell my health care provider before I take this medicine? They need to know if you have any of these conditions:  frequently drink alcohol  asthma  blood vessel disease or a history of a blood clot in the lungs or legs  bone disease such as osteoporosis  breast cancer  diabetes  eating disorder (anorexia nervosa or bulimia)  high blood pressure  HIV infection or AIDS  kidney disease  liver disease  mental depression  migraine  seizures (convulsions)  stroke  tobacco smoker  vaginal bleeding  an unusual or allergic reaction to medroxyprogesterone, other hormones, medicines, foods, dyes, or preservatives  pregnant or trying to get pregnant  breast-feeding How should I use this medicine? Depo-Provera Contraceptive injection is given into a muscle. Depo-subQ Provera 104 injection is given under the skin. These injections are given by a health care professional. You must not be pregnant before getting an injection. The injection is usually given during the first 5 days after the start of a menstrual period or 6 weeks after delivery of a baby. Talk to your pediatrician regarding the use of this medicine in children. Special care may be needed. These injections have been used in female children who have started having menstrual periods. Overdosage: If you think you have taken too much of this medicine contact a poison control center or emergency room at once. NOTE: This medicine is only for you. Do not  share this medicine with others. What if I miss a dose? Try not to miss a dose. You must get an injection once every 3 months to maintain birth control. If you cannot keep an appointment, call and reschedule it. If you wait longer than 13 weeks between Depo-Provera contraceptive injections or longer than 14 weeks between Depo-subQ Provera 104 injections, you could get pregnant. Use another method for birth control if you miss your appointment. You may also need a pregnancy test before receiving another injection. What may interact with this medicine? Do not take this medicine with any of the following medications:  bosentan This medicine may also interact with the following medications:  aminoglutethimide  antibiotics or medicines for infections, especially rifampin, rifabutin, rifapentine, and griseofulvin  aprepitant  barbiturate medicines such as phenobarbital or primidone  bexarotene  carbamazepine  medicines for seizures like ethotoin, felbamate, oxcarbazepine, phenytoin, topiramate  modafinil  St. John's wort This list may not describe all possible interactions. Give your health care provider a list of all the medicines, herbs, non-prescription drugs, or dietary supplements you use. Also tell them if you smoke, drink alcohol, or use illegal drugs. Some items may interact with your medicine. What should I watch for while using this medicine? This drug does not protect you against HIV infection (AIDS) or other sexually transmitted diseases. Use of this product may cause you to lose calcium from your bones. Loss of calcium may cause weak bones (osteoporosis). Only use this product for more than 2 years if other forms of birth control are not right for you. The longer you use this product for birth control the more likely you will be at risk   for weak bones. Ask your health care professional how you can keep strong bones. You may have a change in bleeding pattern or irregular periods.  Many females stop having periods while taking this drug. If you have received your injections on time, your chance of being pregnant is very low. If you think you may be pregnant, see your health care professional as soon as possible. Tell your health care professional if you want to get pregnant within the next year. The effect of this medicine may last a long time after you get your last injection. What side effects may I notice from receiving this medicine? Side effects that you should report to your doctor or health care professional as soon as possible:  allergic reactions like skin rash, itching or hives, swelling of the face, lips, or tongue  breast tenderness or discharge  breathing problems  changes in vision  depression  feeling faint or lightheaded, falls  fever  pain in the abdomen, chest, groin, or leg  problems with balance, talking, walking  unusually weak or tired  yellowing of the eyes or skin Side effects that usually do not require medical attention (report to your doctor or health care professional if they continue or are bothersome):  acne  fluid retention and swelling  headache  irregular periods, spotting, or absent periods  temporary pain, itching, or skin reaction at site where injected  weight gain This list may not describe all possible side effects. Call your doctor for medical advice about side effects. You may report side effects to FDA at 1-800-FDA-1088. Where should I keep my medicine? This does not apply. The injection will be given to you by a health care professional. NOTE: This sheet is a summary. It may not cover all possible information. If you have questions about this medicine, talk to your doctor, pharmacist, or health care provider.  2020 Elsevier/Gold Standard (2008-10-19 18:37:56)  

## 2020-08-15 LAB — CERVICOVAGINAL ANCILLARY ONLY
Chlamydia: NEGATIVE
Comment: NEGATIVE
Comment: NEGATIVE
Comment: NORMAL
Neisseria Gonorrhea: NEGATIVE
Trichomonas: NEGATIVE

## 2020-09-02 ENCOUNTER — Other Ambulatory Visit: Payer: Medicaid Other | Admitting: Advanced Practice Midwife

## 2020-09-19 ENCOUNTER — Other Ambulatory Visit: Payer: Medicaid Other | Admitting: Advanced Practice Midwife

## 2020-12-24 ENCOUNTER — Other Ambulatory Visit: Payer: Medicaid Other

## 2021-01-01 ENCOUNTER — Other Ambulatory Visit: Payer: Medicaid Other

## 2021-01-15 ENCOUNTER — Other Ambulatory Visit: Payer: Medicaid Other

## 2021-01-22 ENCOUNTER — Other Ambulatory Visit: Payer: Medicaid Other | Admitting: Advanced Practice Midwife

## 2021-02-01 ENCOUNTER — Encounter: Payer: Self-pay | Admitting: Emergency Medicine

## 2021-02-01 ENCOUNTER — Ambulatory Visit
Admission: EM | Admit: 2021-02-01 | Discharge: 2021-02-01 | Disposition: A | Discharge status: Home / Self Care | Payer: Medicaid Other | Attending: Family Medicine | Admitting: Family Medicine

## 2021-02-01 ENCOUNTER — Other Ambulatory Visit: Payer: Self-pay

## 2021-02-01 DIAGNOSIS — J020 Streptococcal pharyngitis: Secondary | ICD-10-CM | POA: Diagnosis not present

## 2021-02-01 LAB — POCT RAPID STREP A (OFFICE): Rapid Strep A Screen: POSITIVE — AB

## 2021-02-01 MED ORDER — CEPHALEXIN 500 MG PO CAPS: 500.0000 mg | ORAL_CAPSULE | Freq: Two times a day (BID) | ORAL | 0 refills | Status: AC

## 2021-02-01 NOTE — ED Provider Notes (Signed)
[facility]   [id number] 02/01/21 Arrival Time: 0830  EU:MPNT THROAT  SUBJECTIVE: History from: patient.  April Young is a 25 y.o. female who presents with abrupt onset of sore throat since yesterday. Denies sick exposure to Covid, strep, flu or mono, or precipitating event. Has tried drinking warm liquids without relief. Has negative history of Covid. Has not completed Covid vaccines. Symptoms are made worse with swallowing, but tolerating liquids and own secretions without difficulty. Denies previous symptoms in the past.     Denies fever, chills, fatigue, ear pain, sinus pain, rhinorrhea, nasal congestion, cough, SOB, wheezing, chest pain, nausea, rash, changes in bowel or bladder habits.    ROS: As per HPI.  All other pertinent ROS negative.     Past Medical History:  Diagnosis Date  . Abdominal pain affecting pregnancy 03/10/2017  . Candida vaginitis 03/10/2017  . Hx of trichomoniasis 11/2016  . Rh negative status during pregnancy   . Sepsis due to Escherichia coli (HCC) 03/2017  . Urinary tract infection 03/10/2017   Past Surgical History:  Procedure Laterality Date  . SKIN SURGERY     abcess   Allergies  Allergen Reactions  . Penicillins Rash and Other (See Comments)    Has patient had a PCN reaction causing immediate rash, facial/tongue/throat swelling, SOB or lightheadedness with hypotension: No Has patient had a PCN reaction causing severe rash involving mucus membranes or skin necrosis: No Has patient had a PCN reaction that required hospitalization: No Has patient had a PCN reaction occurring within the last 10 years: No If all of the above answers are "NO", then may proceed with Cephalosporin use.   No current facility-administered medications on file prior to encounter.   Current Outpatient Medications on File Prior to Encounter  Medication Sig Dispense Refill  . acetaminophen (TYLENOL) 325 MG tablet Take 650 mg by mouth every 6 (six) hours as  needed for mild pain.    . medroxyPROGESTERone (DEPO-PROVERA) 150 MG/ML injection Inject 1 mL (150 mg total) into the muscle every 3 (three) months. 1 mL 4  . medroxyPROGESTERone Acetate 150 MG/ML SUSY INJECT 1 VIAL INTRAMUSCULARLY EVERY 3 MONTHS IN OFFICE. (Patient not taking: Reported on 08/14/2020) 1 mL 3   Social History   Socioeconomic History  . Marital status: Single    Spouse name: Not on file  . Number of children: 1  . Years of education: Not on file  . Highest education level: Not on file  Occupational History  . Not on file  Tobacco Use  . Smoking status: Former Smoker    Types: E-cigarettes  . Smokeless tobacco: Never Used  Vaping Use  . Vaping Use: Former  Substance and Sexual Activity  . Alcohol use: No  . Drug use: No    Types: Marijuana    Comment: states she stopped with pregnancy  . Sexual activity: Not Currently    Birth control/protection: None  Other Topics Concern  . Not on file  Social History Narrative  . Not on file   Social Determinants of Health   Financial Resource Strain: Not on file  Food Insecurity: Not on file  Transportation Needs: Not on file  Physical Activity: Not on file  Stress: Not on file  Social Connections: Not on file  Intimate Partner Violence: Not on file   Family History  Problem Relation Age of Onset  . COPD Maternal Grandmother     OBJECTIVE:  Vitals:   02/01/21 0839 02/01/21 0842  BP: Marland Kitchen)  95/58   Pulse: 99   Resp: 16   Temp: 97.7 F (36.5 C)   TempSrc: Temporal   SpO2: 98%   Weight:  110 lb (49.9 kg)     General appearance: alert; appears fatigued, but nontoxic, speaking in full sentences and managing own secretions HEENT: NCAT; Ears: EACs clear, TMs pearly gray with visible cone of light, without erythema; Eyes: PERRL, EOMI grossly; Nose: no obvious rhinorrhea; Throat: oropharynx erythematous, tonsils 3+ and mildly erythematous with white tonsillar exudates, uvula midline Neck: supple with LAD Lungs: CTA  bilaterally without adventitious breath sounds; cough absent Heart: regular rate and rhythm.  Radial pulses 2+ symmetrical bilaterally Skin: warm and dry Psychological: alert and cooperative; normal mood and affect  LABS: Results for orders placed or performed during the hospital encounter of 02/01/21 (from the past 24 hour(s))  POCT rapid strep A     Status: Abnormal   Collection Time: 02/01/21  8:56 AM  Result Value Ref Range   Rapid Strep A Screen Positive (A) Negative     ASSESSMENT & PLAN:  1. Streptococcal sore throat     Meds ordered this encounter  Medications  . cephALEXin (KEFLEX) 500 MG capsule    Sig: Take 1 capsule (500 mg total) by mouth 2 (two) times daily for 7 days.    Dispense:  14 capsule    Refill:  0    Order Specific Question:   Supervising Provider    Answer:   Merrilee Jansky [id number]    Strep was positive.  Push fluids and get rest Prescribed Keflex 500mg  twice daily for 7 days.   Take as directed and to completion.  Drink warm or cool liquids, use throat lozenges, or popsicles to help alleviate symptoms Take OTC ibuprofen or tylenol as needed for pain Follow up with PCP if symptoms persist Return or go to ER if you have any new or worsening symptoms such as fever, chills, nausea, vomiting, worsening sore throat, cough, abdominal pain, chest pain, changes in bowel or bladder habits  Reviewed expectations re: course of current medical issues. Questions answered. Outlined signs and symptoms indicating need for more acute intervention. Patient verbalized understanding. After Visit Summary given.          , NP 02/01/21 [phone]

## 2021-02-01 NOTE — ED Triage Notes (Signed)
Sore throat x 1 day 

## 2021-02-01 NOTE — Discharge Instructions (Addendum)
I have sent in Keflex for you to take twice a day for 7 days  Follow up with this office or with primary care if symptoms are persisting.  Follow up in the ER for high fever, trouble swallowing, trouble breathing, other concerning symptoms.  

## 2021-02-19 ENCOUNTER — Ambulatory Visit: Payer: Medicaid Other | Admitting: Advanced Practice Midwife

## 2021-03-20 ENCOUNTER — Other Ambulatory Visit: Payer: Medicaid Other

## 2021-08-25 ENCOUNTER — Other Ambulatory Visit: Payer: Medicaid Other | Admitting: Women's Health

## 2021-10-08 ENCOUNTER — Ambulatory Visit: Payer: Medicaid Other | Admitting: Adult Health

## 2022-01-05 ENCOUNTER — Other Ambulatory Visit (HOSPITAL_COMMUNITY)
Admission: RE | Admit: 2022-01-05 | Discharge: 2022-01-05 | Disposition: A | Discharge status: Home / Self Care | Payer: Medicaid Other | Source: Ambulatory Visit | Attending: Obstetrics & Gynecology | Admitting: Obstetrics & Gynecology

## 2022-01-05 ENCOUNTER — Other Ambulatory Visit (INDEPENDENT_AMBULATORY_CARE_PROVIDER_SITE_OTHER): Payer: Medicaid Other

## 2022-01-05 ENCOUNTER — Other Ambulatory Visit: Payer: Self-pay

## 2022-01-05 DIAGNOSIS — Z113 Encounter for screening for infections with a predominantly sexual mode of transmission: Secondary | ICD-10-CM

## 2022-01-05 NOTE — Progress Notes (Signed)
? ?  NURSE VISIT- STD ? ?SUBJECTIVE:  ?April Young is a 26 y.o. G1P1001 [name] here for a vaginal swab for STD screen following reports from an ex-partner of possible exposure. She reports the following symptoms: none. ?Denies abnormal vaginal bleeding, significant pelvic pain, fever, or UTI symptoms. ? ?OBJECTIVE:  ?There were no vitals taken for this visit.  ?Appears well, in no apparent distress ? ?ASSESSMENT: ?Vaginal swab for STD screen ? ?PLAN: ?Self-collected vaginal probe for Gonorrhea, Chlamydia, Trichomonas sent to lab ?Treatment: to be determined once results are received ?Follow-up as needed if symptoms persist/worsen, or new symptoms develop ? ?April Young A April Young  ?01/05/2022 ?11:21 AM  ?

## 2022-01-06 ENCOUNTER — Other Ambulatory Visit: Payer: Self-pay | Admitting: Adult Health

## 2022-01-06 ENCOUNTER — Telehealth: Payer: Self-pay | Admitting: Adult Health

## 2022-01-06 DIAGNOSIS — A599 Trichomoniasis, unspecified: Secondary | ICD-10-CM

## 2022-01-06 LAB — CERVICOVAGINAL ANCILLARY ONLY
Chlamydia: NEGATIVE
Comment: NEGATIVE
Comment: NEGATIVE
Comment: NORMAL
Neisseria Gonorrhea: NEGATIVE
Trichomonas: POSITIVE — AB

## 2022-01-06 MED ORDER — METRONIDAZOLE 500 MG PO TABS: 500.0000 mg | ORAL_TABLET | Freq: Two times a day (BID) | ORAL | 0 refills | Status: DC

## 2022-01-06 NOTE — Progress Notes (Signed)
+  trich on vaginal swab, will rx flagyl, no sex or alcohol, can get proof of treatment in 2 weeks. Make sure partner is treated.  ?

## 2022-01-06 NOTE — Telephone Encounter (Signed)
Metrogel is not for trich, take the flagyl and no sex or alcohol ?

## 2022-01-06 NOTE — Telephone Encounter (Signed)
Patient wants gel instead of pill of flagyl. Please advise.  ?

## 2022-01-19 ENCOUNTER — Other Ambulatory Visit (HOSPITAL_COMMUNITY)
Admission: RE | Admit: 2022-01-19 | Discharge: 2022-01-19 | Disposition: A | Discharge status: Home / Self Care | Payer: Medicaid Other | Source: Ambulatory Visit | Attending: Obstetrics & Gynecology | Admitting: Obstetrics & Gynecology

## 2022-01-19 ENCOUNTER — Other Ambulatory Visit (INDEPENDENT_AMBULATORY_CARE_PROVIDER_SITE_OTHER): Payer: Medicaid Other

## 2022-01-19 DIAGNOSIS — A599 Trichomoniasis, unspecified: Secondary | ICD-10-CM | POA: Diagnosis not present

## 2022-01-19 NOTE — Progress Notes (Signed)
? ?  NURSE VISIT- POC ? ?SUBJECTIVE:  ?April Young is a 26 y.o. G1P1001 [name] here for a vaginal swab for proof of cure after treatment for Trichomonas.  She reports the following symptoms: none ?Denies abnormal vaginal bleeding, significant pelvic pain, fever, or UTI symptoms. ? ?OBJECTIVE:  ?There were no vitals taken for this visit.  ?Appears well, in no apparent distress ? ?ASSESSMENT: ?Vaginal swab for proof of cure after treatment for trichomonas ? ?PLAN: ?Self-collected vaginal probe for Trichomonas sent to lab ?Treatment: to be determined once results are received ?Follow-up as needed if symptoms persist/worsen, or new symptoms develop ? ?April Young April Young  ?01/19/2022 ?4:16 PM ? ?

## 2022-01-21 LAB — CERVICOVAGINAL ANCILLARY ONLY
Comment: NEGATIVE
Trichomonas: NEGATIVE

## 2022-01-26 ENCOUNTER — Encounter: Payer: Self-pay | Admitting: Obstetrics & Gynecology

## 2022-01-26 ENCOUNTER — Ambulatory Visit: Payer: Medicaid Other

## 2022-01-26 NOTE — Progress Notes (Signed)
Visit canceled, pt will do STD testing with pap on 4/19 ?

## 2022-01-28 ENCOUNTER — Encounter: Payer: Self-pay | Admitting: Advanced Practice Midwife

## 2022-01-28 ENCOUNTER — Other Ambulatory Visit (HOSPITAL_COMMUNITY)
Admission: RE | Admit: 2022-01-28 | Discharge: 2022-01-28 | Disposition: A | Discharge status: Home / Self Care | Payer: Medicaid Other | Source: Ambulatory Visit | Attending: Advanced Practice Midwife | Admitting: Advanced Practice Midwife

## 2022-01-28 ENCOUNTER — Ambulatory Visit (INDEPENDENT_AMBULATORY_CARE_PROVIDER_SITE_OTHER): Payer: Medicaid Other | Admitting: Advanced Practice Midwife

## 2022-01-28 VITALS — BP 94/58 | HR 71 | Ht 69.0 in | Wt 106.0 lb

## 2022-01-28 DIAGNOSIS — Z8489 Family history of other specified conditions: Secondary | ICD-10-CM | POA: Insufficient documentation

## 2022-01-28 DIAGNOSIS — Z01419 Encounter for gynecological examination (general) (routine) without abnormal findings: Secondary | ICD-10-CM

## 2022-01-28 DIAGNOSIS — Z3202 Encounter for pregnancy test, result negative: Secondary | ICD-10-CM | POA: Diagnosis not present

## 2022-01-28 DIAGNOSIS — Z30013 Encounter for initial prescription of injectable contraceptive: Secondary | ICD-10-CM | POA: Diagnosis not present

## 2022-01-28 LAB — POCT URINE PREGNANCY: Preg Test, Ur: NEGATIVE

## 2022-01-28 MED ORDER — MEDROXYPROGESTERONE ACETATE 150 MG/ML IM SUSP: 150.0000 mg | INTRAMUSCULAR | 4 refills | Status: DC

## 2022-01-28 MED ORDER — MEDROXYPROGESTERONE ACETATE 150 MG/ML IM SUSP
150.0000 mg | Freq: Once | INTRAMUSCULAR | Status: AC
  Administered 2022-01-28: 150 mg via INTRAMUSCULAR

## 2022-01-28 NOTE — Progress Notes (Signed)
Depo Provera 150mg  IM given in right deltoid with no complications.  ?

## 2022-01-28 NOTE — Progress Notes (Signed)
? ?WELL-WOMAN EXAMINATION ?Patient name: April Young MRN [id number]  Date of birth: 1996/08/25 ?Chief Complaint:   ?Gynecologic Exam ? ?History of Present Illness:   ?April Young is a 26 y.o. G53P1001 Caucasian female being seen today for a routine well-woman exam.  ?Current complaints: increased white vag discharge; no odor or irritation; no dysuria; last sex March 21st and had period since then ? ?PCP: none      ?does not desire labs ?Patient's last menstrual period was 01/05/2022 (exact date). ?The current method of family planning is none.  ?Last pap never had one. Results were: N/A. H/O abnormal pap: no ?Last mammogram: never. Results were: N/A. Family h/o breast cancer: yes MGM had mastectomy- unsure her age at diagnosis but will find out ?Last colonoscopy: never. Results were: N/A. Family h/o colorectal cancer: no ? ? ?  01/28/2022  ? 11:06 AM 01/19/2017  ?  2:01 PM  ?Depression screen PHQ 2/9  ?Decreased Interest 2 1  ?Down, Depressed, Hopeless 1 0  ?PHQ - 2 Score 3 1  ?Altered sleeping 1 1  ?Tired, decreased energy 1 1  ?Change in appetite 3 1  ?Feeling bad or failure about yourself  1 0  ?Trouble concentrating 1 0  ?Moving slowly or fidgety/restless 0 0  ?Suicidal thoughts 0 0  ?PHQ-9 Score 10 4  ? ?  ? ?  01/28/2022  ? 11:07 AM  ?GAD 7 : Generalized Anxiety Score  ?Nervous, Anxious, on Edge 1  ?Control/stop worrying 3  ?Worry too much - different things 3  ?Trouble relaxing 1  ?Restless 0  ?Easily annoyed or irritable 1  ?Afraid - awful might happen 1  ?Total GAD 7 Score 10  ? ? ? ?Review of Systems:   ?Pertinent items are noted in HPI ?Denies any headaches, blurred vision, fatigue, shortness of breath, chest pain, abdominal pain, abnormal vaginal discharge/itching/odor/irritation, problems with periods, bowel movements, urination, or intercourse unless otherwise stated above. ?Pertinent History Reviewed:  ?Reviewed past medical,surgical, social and family history.  ?Reviewed problem list,  medications and allergies. ?Physical Assessment:  ? ?Vitals:  ? 01/28/22 1105  ?BP: (!) 94/58  ?Pulse: 71  ?Weight: 106 lb (48.1 kg)  ?Height: 5\' 9"  (1.753 m)  ?Body mass index is 15.65 kg/m?. ?  ?     Physical Examination:  ? General appearance - well appearing, and in no distress ? Mental status - alert, oriented to person, place, and time ? Psych:  She has a normal mood and affect ? Skin - warm and dry, normal color, no suspicious lesions noted ? Chest - effort normal, all lung fields clear to auscultation bilaterally ? Heart - normal rate and regular rhythm ? Neck:  midline trachea, no thyromegaly or nodules ? Breasts - breasts appear normal, no suspicious masses, no skin or nipple changes or  axillary nodes ? Abdomen - soft, nontender, nondistended, no masses or organomegaly ? Pelvic - VULVA: normal appearing vulva with no masses, tenderness or lesions  VAGINA: normal appearing vagina with normal color and discharge- increased white creamy d/c, no lesions  CERVIX: normal appearing cervix without discharge or lesions, no CMT ? Thin prep pap is done without HR HPV cotesting ? UTERUS: uterus is felt to be normal size, shape, consistency and nontender  ? ADNEXA: No adnexal masses or tenderness noted. ? Rectal - not examined ? Extremities:  No swelling or varicosities noted ? ?Chaperone:   ? ?Results for orders placed or performed in visit  on 01/28/22 (from the past 24 hour(s))  ?POCT urine pregnancy  ? Collection Time: 01/28/22 11:41 AM  ?Result Value Ref Range  ? Preg Test, Ur Negative Negative  ?  ?Assessment & Plan:  ?1) Well-Woman Exam ? ?2) Depo restart today ? ?3) Vaginal discharge, tests on Pap ordered ? ?Labs/procedures today: Pap/GC/chlam/trich ? ?Mammogram:  will check with MGM age of her CA dx , or sooner if problems ?Colonoscopy: @ 25yo, or sooner if problems ? ?Orders Placed This Encounter  ?Procedures  ? POCT urine pregnancy  ? ? ?Meds:  ?Meds ordered this encounter  ?Medications  ?  medroxyPROGESTERone (DEPO-PROVERA) 150 MG/ML injection  ?  Sig: Inject 1 mL (150 mg total) into the muscle every 3 (three) months.  ?  Dispense:  1 mL  ?  Refill:  4  ? medroxyPROGESTERone (DEPO-PROVERA) injection 150 mg  ? ? ?Follow-up: Return for Depo injection in 71months. ? ?Arabella Merles CNM ?01/28/2022 ?12:00 PM  ?

## 2022-01-30 LAB — CYTOLOGY - PAP
Adequacy: ABSENT
Chlamydia: NEGATIVE
Comment: NEGATIVE
Comment: NORMAL
Diagnosis: NEGATIVE
Diagnosis: REACTIVE
Neisseria Gonorrhea: NEGATIVE

## 2022-01-31 ENCOUNTER — Other Ambulatory Visit: Payer: Self-pay | Admitting: Advanced Practice Midwife

## 2022-01-31 MED ORDER — FLUCONAZOLE 150 MG PO TABS: 150.0000 mg | ORAL_TABLET | Freq: Once | ORAL | 0 refills | Status: AC

## 2022-03-11 ENCOUNTER — Other Ambulatory Visit: Payer: Self-pay | Admitting: Advanced Practice Midwife

## 2022-03-11 MED ORDER — MEGESTROL ACETATE 40 MG PO TABS: 40.0000 mg | ORAL_TABLET | Freq: Every day | ORAL | 1 refills | Status: DC

## 2022-04-06 ENCOUNTER — Other Ambulatory Visit (INDEPENDENT_AMBULATORY_CARE_PROVIDER_SITE_OTHER): Payer: Medicaid Other | Admitting: *Deleted

## 2022-04-06 ENCOUNTER — Other Ambulatory Visit (HOSPITAL_COMMUNITY)
Admission: RE | Admit: 2022-04-06 | Discharge: 2022-04-06 | Disposition: A | Discharge status: Home / Self Care | Payer: Medicaid Other | Source: Ambulatory Visit | Attending: Obstetrics & Gynecology | Admitting: Obstetrics & Gynecology

## 2022-04-06 DIAGNOSIS — N926 Irregular menstruation, unspecified: Secondary | ICD-10-CM | POA: Diagnosis not present

## 2022-04-07 ENCOUNTER — Other Ambulatory Visit: Payer: Self-pay | Admitting: Adult Health

## 2022-04-07 LAB — CERVICOVAGINAL ANCILLARY ONLY
Bacterial Vaginitis (gardnerella): POSITIVE — AB
Candida Glabrata: NEGATIVE
Candida Vaginitis: NEGATIVE
Chlamydia: NEGATIVE
Comment: NEGATIVE
Comment: NEGATIVE
Comment: NEGATIVE
Comment: NEGATIVE
Comment: NEGATIVE
Comment: NORMAL
Neisseria Gonorrhea: NEGATIVE
Trichomonas: NEGATIVE

## 2022-04-07 MED ORDER — METRONIDAZOLE 500 MG PO TABS: 500.0000 mg | ORAL_TABLET | Freq: Two times a day (BID) | ORAL | 0 refills | Status: DC

## 2022-04-20 ENCOUNTER — Ambulatory Visit: Payer: Medicaid Other

## 2022-05-27 ENCOUNTER — Other Ambulatory Visit (INDEPENDENT_AMBULATORY_CARE_PROVIDER_SITE_OTHER): Payer: Medicaid Other | Admitting: Adult Health

## 2022-05-27 ENCOUNTER — Other Ambulatory Visit (HOSPITAL_COMMUNITY)
Admission: RE | Admit: 2022-05-27 | Discharge: 2022-05-27 | Disposition: A | Discharge status: Home / Self Care | Payer: Medicaid Other | Source: Ambulatory Visit | Attending: Obstetrics & Gynecology | Admitting: Obstetrics & Gynecology

## 2022-05-27 ENCOUNTER — Encounter: Payer: Self-pay | Admitting: Adult Health

## 2022-05-27 VITALS — BP 99/68 | HR 89 | Ht 68.0 in | Wt 103.5 lb

## 2022-05-27 DIAGNOSIS — Z113 Encounter for screening for infections with a predominantly sexual mode of transmission: Secondary | ICD-10-CM

## 2022-05-27 DIAGNOSIS — Z30011 Encounter for initial prescription of contraceptive pills: Secondary | ICD-10-CM | POA: Insufficient documentation

## 2022-05-27 DIAGNOSIS — Z3202 Encounter for pregnancy test, result negative: Secondary | ICD-10-CM | POA: Diagnosis not present

## 2022-05-27 LAB — POCT URINE PREGNANCY: Preg Test, Ur: NEGATIVE

## 2022-05-27 MED ORDER — LO LOESTRIN FE 1 MG-10 MCG / 10 MCG PO TABS: 1.0000 | ORAL_TABLET | Freq: Every day | ORAL | 11 refills | Status: DC

## 2022-05-27 NOTE — Progress Notes (Signed)
  Subjective:     Patient ID: April Young, female   DOB: [birth year], 26 y.o.   MRN: [id number]  HPI April Young is a 26 year old black female,single, G1P1 in requesting STD testing, has new partner, and wants to discuss birth control options.  Lab Results  Component Value Date   DIAGPAP  01/28/2022    - Negative for Intraepithelial Lesions or Malignancy (NILM)   DIAGPAP - Benign reactive/reparative changes 01/28/2022    Review of Systems Denies any discharge, itching or burning Has new sex partner Periods not regular  Reviewed past medical,surgical, social and family history. Reviewed medications and allergies.     Objective:   Physical Exam BP 99/68 (BP Location: Left Arm, Patient Position: Sitting, Cuff Size: Normal)   Pulse 89   Ht 5\' 8"  (1.727 m)   Wt 103 lb 8 oz (46.9 kg)   BMI 15.74 kg/m  UPT is negative. Skin warm and dry.  Lungs: clear to ausculation bilaterally. Cardiovascular: regular rate and rhythm.   Pelvic: external genitalia is normal in appearance no lesions, vagina: scant white discharge with slight odor,urethra has no lesions or masses noted, cervix:smooth and bulbous, uterus: normal size, shape and contour, non tender, no masses felt, adnexa: no masses or tenderness noted. Bladder is non tender and no masses felt. CV swab obtained.  Upstream - 05/27/22 1521       Pregnancy Intention Screening   Does the patient want to become pregnant in the next year? No    Does the patient's partner want to become pregnant in the next year? No    Would the patient like to discuss contraceptive options today? Yes      Contraception Wrap Up   Current Method No Method - Other Reason    End Method Oral Contraceptive    Contraception Counseling Provided Yes            Examination chaperoned by 05/29/22 LPN  Assessment:     1. Pregnancy examination or test, negative result - POCT urine pregnancy  2. Screening examination for STD (sexually transmitted disease) CV  swab sent for GC/CHL,trich,BV and yeast Will get blood work in am, orders given, has to go now  - Cervicovaginal ancillary only( [place]) - Hepatitis C antibody - Hepatitis B surface antigen - HIV Antibody (routine testing w rflx) - RPR  3. Encounter for initial prescription of contraceptive pills She denies MI,stroke,DVT,breast cancer or migraine with aura Discussed options and she wants to try the pill Will rx lo Loestrin, can start today, and use condoms for 1 pack and take at same time every day Meds ordered this encounter  Medications   Norethindrone-Ethinyl Estradiol-Fe Biphas (LO LOESTRIN FE) 1 MG-10 MCG / 10 MCG tablet    Sig: Take 1 tablet by mouth daily. Take 1 daily by mouth    Dispense:  28 tablet    Refill:  11    BIN Malachy Mood, PCN CN, GRP [id number]    Order Specific Question:   Supervising Provider    Answer:   [id number] [[id number]]       Plan:     Follow up in 3 months for ROS on OCs

## 2022-05-29 LAB — CERVICOVAGINAL ANCILLARY ONLY
Bacterial Vaginitis (gardnerella): POSITIVE — AB
Candida Glabrata: NEGATIVE
Candida Vaginitis: NEGATIVE
Chlamydia: NEGATIVE
Comment: NEGATIVE
Comment: NEGATIVE
Comment: NEGATIVE
Comment: NEGATIVE
Comment: NEGATIVE
Comment: NORMAL
Neisseria Gonorrhea: NEGATIVE
Trichomonas: NEGATIVE

## 2022-06-01 ENCOUNTER — Other Ambulatory Visit: Payer: Self-pay | Admitting: Adult Health

## 2022-06-01 MED ORDER — METRONIDAZOLE 500 MG PO TABS: 500.0000 mg | ORAL_TABLET | Freq: Two times a day (BID) | ORAL | 0 refills | Status: DC

## 2022-06-01 NOTE — Progress Notes (Signed)
+  BV on vaginal swab will rx flagyl,no sex or alcohol while taking  ?

## 2022-07-25 ENCOUNTER — Emergency Department (HOSPITAL_COMMUNITY)
Admission: EM | Admit: 2022-07-25 | Discharge: 2022-07-25 | Disposition: A | Discharge status: Home / Self Care | Payer: Medicaid Other | Attending: Emergency Medicine | Admitting: Emergency Medicine

## 2022-07-25 ENCOUNTER — Other Ambulatory Visit: Payer: Self-pay

## 2022-07-25 ENCOUNTER — Encounter (HOSPITAL_COMMUNITY): Payer: Self-pay | Admitting: *Deleted

## 2022-07-25 DIAGNOSIS — N9489 Other specified conditions associated with female genital organs and menstrual cycle: Secondary | ICD-10-CM | POA: Insufficient documentation

## 2022-07-25 DIAGNOSIS — Z3A01 Less than 8 weeks gestation of pregnancy: Secondary | ICD-10-CM | POA: Diagnosis not present

## 2022-07-25 DIAGNOSIS — O200 Threatened abortion: Secondary | ICD-10-CM | POA: Diagnosis not present

## 2022-07-25 DIAGNOSIS — O209 Hemorrhage in early pregnancy, unspecified: Secondary | ICD-10-CM | POA: Diagnosis present

## 2022-07-25 DIAGNOSIS — O4690 Antepartum hemorrhage, unspecified, unspecified trimester: Secondary | ICD-10-CM

## 2022-07-25 LAB — BASIC METABOLIC PANEL
Anion gap: 6 (ref 5–15)
BUN: 10 mg/dL (ref 6–20)
CO2: 23 mmol/L (ref 22–32)
Calcium: 8.7 mg/dL — ABNORMAL LOW (ref 8.9–10.3)
Chloride: 104 mmol/L (ref 98–111)
Creatinine, Ser: 0.72 mg/dL (ref 0.44–1.00)
GFR, Estimated: >60 mL/min (ref >60)
Glucose, Bld: 102 mg/dL — ABNORMAL HIGH (ref 70–99)
Potassium: 3.5 mmol/L (ref 3.5–5.1)
Sodium: 133 mmol/L — ABNORMAL LOW (ref 135–145)

## 2022-07-25 LAB — RH IG WORKUP (INCLUDES ABO/RH): Gestational Age(Wks): 6

## 2022-07-25 LAB — CBC WITH DIFFERENTIAL/PLATELET
Abs Immature Granulocytes: 0.04 10*3/uL (ref 0.00–0.07)
Basophils Absolute: 0 10*3/uL (ref 0.0–0.1)
Basophils Relative: 0 %
Eosinophils Absolute: 0.1 10*3/uL (ref 0.0–0.5)
Eosinophils Relative: 1 %
HCT: 37.7 % (ref 36.0–46.0)
Hemoglobin: 12.3 g/dL (ref 12.0–15.0)
Immature Granulocytes: 1 %
Lymphocytes Relative: 21 %
Lymphs Abs: 1.7 10*3/uL (ref 0.7–4.0)
MCH: 28.7 pg (ref 26.0–34.0)
MCHC: 32.6 g/dL (ref 30.0–36.0)
MCV: 87.9 fL (ref 80.0–100.0)
Monocytes Absolute: 0.7 10*3/uL (ref 0.1–1.0)
Monocytes Relative: 8 %
Neutro Abs: 5.7 10*3/uL (ref 1.7–7.7)
Neutrophils Relative %: 69 %
Platelets: 155 10*3/uL (ref 150–400)
RBC: 4.29 MIL/uL (ref 3.87–5.11)
RDW: 12.7 % (ref 11.5–15.5)
WBC: 8.2 10*3/uL (ref 4.0–10.5)
nRBC: 0 % (ref 0.0–0.2)

## 2022-07-25 LAB — HCG, QUANTITATIVE, PREGNANCY: hCG, Beta Chain, Quant, S: 40149 m[IU]/mL — ABNORMAL HIGH (ref <5)

## 2022-07-25 LAB — TYPE AND SCREEN
ABO/RH(D): A NEG
Antibody Screen: POSITIVE

## 2022-07-25 MED ORDER — ONDANSETRON 8 MG PO TBDP
8.0000 mg | ORAL_TABLET | Freq: Once | ORAL | Status: AC
  Administered 2022-07-25: 8 mg via ORAL
  Filled 2022-07-25: qty 1

## 2022-07-25 NOTE — ED Triage Notes (Signed)
Pt states she is [redacted] weeks pregnant, began to have vaginal bleeding this morning.  Moderate bleeding, denies any clots. Pink and red in color. LMP March 17, 2022, pt with hx of depo shot.    Pt c/o generalized weakness for past 2 days. + N/V. Abd cramping earlier but denies at present. Pt states she has been in the bed for past 2 days. Pt states her daughter has been sick recently

## 2022-07-25 NOTE — ED Provider Notes (Signed)
[facility] EMERGENCY DEPARTMENT Provider Note   CSN: [id number] Arrival date & time: 07/25/22  1244     History  Chief Complaint  Patient presents with   Vaginal Bleeding    April Young is a 26 y.o. female.  Patient presents to the hospital with a chief complaint of vaginal bleeding.  Patient states she is [redacted] weeks pregnant.  She states that over the past 2 days she has felt tired and had mild nausea and vomiting.  She also complains of some mild abdominal cramping.  She endorses her daughter being sick recently but states that she did not have nausea and vomiting.  Patient states she has mild to moderate bleeding that she noticed this morning with no visible clots.  She states that the blood was pinkish to red in color.  This is the patient's second pregnancy and she states that her first pregnancy was complicated including a threatened miscarriage in the first trimester.  She denies frank abdominal pain, chest pain, shortness of breath, dysuria.  Patient is getting prenatal care from a provider in [place].  Past medical history significant for abdominal pain affecting pregnancy, Rh- status during pregnancy, history of trichomoniasis, history of sepsis due to E. coli  HPI     Home Medications Prior to Admission medications   Medication Sig Start Date End Date Taking? Authorizing Provider  metroNIDAZOLE (FLAGYL) 500 MG tablet Take 1 tablet (500 mg total) by mouth 2 (two) times daily. 06/01/22   Estill Dooms, NP  Norethindrone-Ethinyl Estradiol-Fe Biphas (LO LOESTRIN FE) 1 MG-10 MCG / 10 MCG tablet Take 1 tablet by mouth daily. Take 1 daily by mouth 05/27/22   Estill Dooms, NP      Allergies    Penicillins    Review of Systems   Review of Systems  Gastrointestinal:  Positive for nausea and vomiting. Negative for abdominal pain, constipation and diarrhea.  Genitourinary:  Positive for vaginal bleeding. Negative for dysuria, pelvic pain and vaginal pain.  Neurological:   Positive for weakness.    Physical Exam Updated Vital Signs BP 119/76   Pulse 74   Temp 97.6 F (36.4 C) (Oral)   Resp 18   Ht 5\' 8"  (1.727 m)   Wt 46 kg   LMP 01/05/2022 (Exact Date)   SpO2 100%   BMI 15.42 kg/m  Physical Exam Vitals and nursing note reviewed.  Constitutional:      General: She is not in acute distress.    Appearance: She is well-developed.  HENT:     Head: Normocephalic and atraumatic.     Mouth/Throat:     Mouth: Mucous membranes are moist.  Eyes:     Conjunctiva/sclera: Conjunctivae normal.  Cardiovascular:     Rate and Rhythm: Normal rate and regular rhythm.     Heart sounds: No murmur heard. Pulmonary:     Effort: Pulmonary effort is normal. No respiratory distress.     Breath sounds: Normal breath sounds.  Abdominal:     Palpations: Abdomen is soft.     Tenderness: There is no abdominal tenderness.  Musculoskeletal:        General: No swelling.     Cervical back: Neck supple.  Skin:    General: Skin is warm and dry.     Capillary Refill: Capillary refill takes less than 2 seconds.  Neurological:     Mental Status: She is alert.  Psychiatric:        Mood and Affect: Mood normal.  ED Results / Procedures / Treatments   Labs (all labs ordered are listed, but only abnormal results are displayed) Labs Reviewed  BASIC METABOLIC PANEL - Abnormal; Notable for the following components:      Result Value   Sodium 133 (*)    Glucose, Bld 102 (*)    Calcium 8.7 (*)    All other components within normal limits  HCG, QUANTITATIVE, PREGNANCY - Abnormal; Notable for the following components:   hCG, Beta Chain, Quant, S 40,149 (*)    All other components within normal limits  CBC WITH DIFFERENTIAL/PLATELET  TYPE AND SCREEN  RH IG WORKUP (INCLUDES ABO/RH)    EKG None  Radiology No results found.  Procedures Procedures    Medications Ordered in ED Medications  ondansetron (ZOFRAN-ODT) disintegrating tablet 8 mg (8 mg Oral Given  07/25/22 1327)    ED Course/ Medical Decision Making/ A&P                           Medical Decision Making Amount and/or Complexity of Data Reviewed Labs: ordered.  Risk Prescription drug management.   This patient presents to the ED for concern of vaginal bleeding during pregnancy, this involves an extensive number of treatment options, and is a complaint that carries with it a high risk of complications and morbidity.  The differential diagnosis includes pregnancy loss, ectopic pregnancy, threatened abortion, and others   Co morbidities that complicate the patient evaluation  History of complicated pregnancy   Additional history obtained:   External records from outside source obtained and reviewed including notes from previous pregnancy showing multiple admissions during that pregnancy for different high risk conditions   Lab Tests:  I Ordered, and personally interpreted labs.  The pertinent results include: Quantitative hCG of 40,149, unremarkable CBC, BMP, blood type type a negative   Imaging Studies ordered:  I considered an ultrasound but pelvic ultrasound services are not available at this time   Problem List / ED Course / Critical interventions / Medication management   I ordered medication including Zofran for nausea, RhoGAM Reevaluation of the patient after these medicines showed that the patient improved I have reviewed the patients home medicines and have made adjustments as needed   Social Determinants of Health:  No primary care provider   Test / Admission - Considered:  The patient has an Hcg level consistent with a 5th week of pregnancy. Unfortunately the patient eloped prior to lab work being completed for administration of RhoGAM.  Patient will need a RhoGAM injection and she represents to the emergency department or to her primary care office.  Patient will also need serial hCGs sometime in the next few days to evaluate the  pregnancy        Final Clinical Impression(s) / ED Diagnoses Final diagnoses:  Vaginal bleeding in pregnancy  Threatened miscarriage    Rx / DC Orders ED Discharge Orders     None         Ronny Bacon 07/25/22 1618    Wyvonnia Dusky, MD 07/26/22 [phone]

## 2022-07-25 NOTE — Discharge Instructions (Signed)
You were seen today for vaginal bleeding during pregnancy.  Recommend she follow-up with your prenatal provider or with the emergency department for repeat labs on Monday to check for viability of the pregnancy.

## 2022-07-25 NOTE — ED Notes (Signed)
Patient left AMA and did not report to anyone that she was leaving

## 2022-07-26 ENCOUNTER — Inpatient Hospital Stay (HOSPITAL_COMMUNITY): Payer: Medicaid Other

## 2022-07-26 ENCOUNTER — Other Ambulatory Visit: Payer: Self-pay | Admitting: Obstetrics and Gynecology

## 2022-07-26 ENCOUNTER — Inpatient Hospital Stay (HOSPITAL_COMMUNITY)
Admission: AD | Admit: 2022-07-26 | Discharge: 2022-07-26 | Disposition: A | Discharge status: Home / Self Care | Payer: Medicaid Other | Attending: Obstetrics & Gynecology | Admitting: Obstetrics & Gynecology

## 2022-07-26 DIAGNOSIS — B9689 Other specified bacterial agents as the cause of diseases classified elsewhere: Secondary | ICD-10-CM | POA: Insufficient documentation

## 2022-07-26 DIAGNOSIS — O209 Hemorrhage in early pregnancy, unspecified: Secondary | ICD-10-CM | POA: Diagnosis not present

## 2022-07-26 DIAGNOSIS — O26891 Other specified pregnancy related conditions, first trimester: Secondary | ICD-10-CM | POA: Insufficient documentation

## 2022-07-26 DIAGNOSIS — N760 Acute vaginitis: Secondary | ICD-10-CM | POA: Insufficient documentation

## 2022-07-26 DIAGNOSIS — Z3A01 Less than 8 weeks gestation of pregnancy: Secondary | ICD-10-CM | POA: Insufficient documentation

## 2022-07-26 DIAGNOSIS — Z6711 Type A blood, Rh negative: Secondary | ICD-10-CM

## 2022-07-26 LAB — WET PREP, GENITAL
Sperm: NONE SEEN
Trich, Wet Prep: NONE SEEN
WBC, Wet Prep HPF POC: <10 (ref <10)
Yeast Wet Prep HPF POC: NONE SEEN

## 2022-07-26 LAB — URINALYSIS, ROUTINE W REFLEX MICROSCOPIC
Bilirubin Urine: NEGATIVE
Glucose, UA: NEGATIVE mg/dL
Ketones, ur: NEGATIVE mg/dL
Nitrite: NEGATIVE
Protein, ur: NEGATIVE mg/dL
Specific Gravity, Urine: 1.023 (ref 1.005–1.030)
pH: 6 (ref 5.0–8.0)

## 2022-07-26 LAB — HCG, QUANTITATIVE, PREGNANCY: hCG, Beta Chain, Quant, S: 41123 m[IU]/mL — ABNORMAL HIGH (ref <5)

## 2022-07-26 MED ORDER — RHO D IMMUNE GLOBULIN 1500 UNIT/2ML IJ SOSY
300.0000 ug | PREFILLED_SYRINGE | Freq: Once | INTRAMUSCULAR | Status: AC
  Administered 2022-07-26: 300 ug via INTRAMUSCULAR
  Filled 2022-07-26: qty 2

## 2022-07-26 MED ORDER — METRONIDAZOLE 500 MG PO TABS: 500.0000 mg | ORAL_TABLET | Freq: Two times a day (BID) | ORAL | 0 refills | Status: AC

## 2022-07-26 NOTE — MAU Provider Note (Signed)
History     CSN: [id number]  Arrival date and time: 07/26/22 0056   Event Date/Time   First Provider Initiated Contact with Patient 07/26/22 0203      Chief Complaint  Patient presents with   Vaginal Bleeding   HPI  Ms.April Young is a 26 y.o. female [id number] @ [redacted]w[redacted]d here in MAU with vaginal bleeding. The bleeding started yesterday. The bleeding is similar to a period. She attempted to go to [facility] yesterday but felt they could not do anything for her. She left AMA. She reports the bleeding continued throughout yesterday. She felt some mild cramps that would come and go. She has no pain at this time.   OB History     Gravida  2   Para  1   Term  1   Preterm  0   AB  0   Living  1      SAB  0   IAB      Ectopic      Multiple  0   Live Births  1           Past Medical History:  Diagnosis Date   Abdominal pain affecting pregnancy 03/10/2017   Candida vaginitis 03/10/2017   Hx of trichomoniasis 11/2016   Rh negative status during pregnancy    Sepsis due to Escherichia coli (HCC) 03/2017   Urinary tract infection 03/10/2017    Past Surgical History:  Procedure Laterality Date   SKIN SURGERY     abcess    Family History  Problem Relation Age of Onset   COPD Maternal Grandmother     Social History   Tobacco Use   Smoking status: Some Days    Types: E-cigarettes   Smokeless tobacco: Never  Vaping Use   Vaping Use: Some days  Substance Use Topics   Alcohol use: No   Drug use: Yes    Types: Marijuana    Comment: "every now and then"    Allergies:  Allergies  Allergen Reactions   Penicillins Rash and Other (See Comments)    Has patient had a PCN reaction causing immediate rash, facial/tongue/throat swelling, SOB or lightheadedness with hypotension: No Has patient had a PCN reaction causing severe rash involving mucus membranes or skin necrosis: No Has patient had a PCN reaction that required hospitalization: No Has  patient had a PCN reaction occurring within the last 10 years: No If all of the above answers are "NO", then may proceed with Cephalosporin use.    Medications Prior to Admission  Medication Sig Dispense Refill Last Dose   metroNIDAZOLE (FLAGYL) 500 MG tablet Take 1 tablet (500 mg total) by mouth 2 (two) times daily. 14 tablet 0    Norethindrone-Ethinyl Estradiol-Fe Biphas (LO LOESTRIN FE) 1 MG-10 MCG / 10 MCG tablet Take 1 tablet by mouth daily. Take 1 daily by mouth 28 tablet 11    Results for orders placed or performed during the hospital encounter of 07/26/22 (from the past 48 hour(s))  Urinalysis, Routine w reflex microscopic     Status: Abnormal   Collection Time: 07/26/22  1:17 AM  Result Value Ref Range   Color, Urine YELLOW YELLOW   APPearance HAZY (A) CLEAR   Specific Gravity, Urine 1.023 1.005 - 1.030   pH 6.0 5.0 - 8.0   Glucose, UA NEGATIVE NEGATIVE mg/dL   Hgb urine dipstick MODERATE (A) NEGATIVE   Bilirubin Urine NEGATIVE NEGATIVE   Ketones, ur NEGATIVE NEGATIVE mg/dL  Protein, ur NEGATIVE NEGATIVE mg/dL   Nitrite NEGATIVE NEGATIVE   Leukocytes,Ua TRACE (A) NEGATIVE   RBC / HPF 0-5 0 - 5 RBC/hpf   WBC, UA 6-10 0 - 5 WBC/hpf   Bacteria, UA MANY (A) NONE SEEN   Squamous Epithelial / LPF 6-10 0 - 5   Mucus PRESENT     Comment: Performed at [facility], [address], [place], [place] [address]  Rh IG workup (includes ABO/Rh)     Status: None (Preliminary result)   Collection Time: 07/26/22  1:43 AM  Result Value Ref Range   Gestational Age(Wks) 6    ABO/RH(D) A NEG    Antibody Screen POS    Unit Number [id number]    Blood Component Type RHIG    Unit division 00    Status of Unit ISSUED    Transfusion Status      OK TO TRANSFUSE Performed at [facility], [address], [place], [place] [address]   hCG, quantitative, pregnancy     Status: Abnormal   Collection Time: 07/26/22  1:43 AM  Result Value Ref Range   hCG, Beta Chain, Quant, S  41,123 (H) <5 mIU/mL    Comment:          GEST. AGE      CONC.  (mIU/mL)   <=1 WEEK        5 - 50     2 WEEKS       50 - 500     3 WEEKS       100 - 10,000     4 WEEKS     1,000 - 30,000     5 WEEKS     3,500 - 115,000   6-8 WEEKS     12,000 - 270,000    12 WEEKS     15,000 - 220,000        FEMALE AND NON-PREGNANT FEMALE:     LESS THAN 5 mIU/mL Performed at [facility], [address] [place], [place] [address]   Wet prep, genital     Status: Abnormal   Collection Time: 07/26/22  2:55 AM  Result Value Ref Range   Yeast Wet Prep HPF POC NONE SEEN NONE SEEN   Trich, Wet Prep NONE SEEN NONE SEEN   Clue Cells Wet Prep HPF POC PRESENT (A) NONE SEEN   WBC, Wet Prep HPF POC <10 <10   Sperm NONE SEEN     Comment: Performed at [facility], [address], [place], [place] [address]    [place] Comp Less 14 Wks  Result Date: 07/26/2022 CLINICAL DATA:  Bleeding, cramping EXAM: OBSTETRIC <14 WK ULTRASOUND TECHNIQUE: Transabdominal ultrasound was performed for evaluation of the gestation as well as the maternal uterus and adnexal regions. COMPARISON:  None Available. FINDINGS: Intrauterine gestational sac: Single Yolk sac:  Visualized. Embryo:  Visualized. Cardiac Activity: Visualized. Heart Rate: 118 bpm MSD:    mm    w     d CRL:   8.4 mm   6 w 5 d                  [place] EDC: 03/16/2023 Subchorionic hemorrhage:  None visualized. Maternal uterus/adnexae: No adnexal mass or free fluid. IMPRESSION: 6 week 5 day intrauterine pregnancy. Fetal heart rate 118 beats per minute. No acute maternal findings. Electronically Signed   By: Charlett Nose M.D.   On: 07/26/2022 02:39  Review of Systems  Constitutional:  Negative for fever.  Gastrointestinal:  Positive for abdominal pain.  Genitourinary:  Positive for vaginal bleeding.   Physical Exam   Blood pressure 109/67, pulse 79, temperature 98.3 F (36.8 C), temperature source Oral, resp. rate 18, height 5\' 8"  (1.727 m), weight 47.5 kg, last  menstrual period 01/05/2022, SpO2 100 %.  Physical Exam Constitutional:      General: She is not in acute distress.    Appearance: Normal appearance. She is not ill-appearing, toxic-appearing or diaphoretic.  Abdominal:     Palpations: Abdomen is soft.     Tenderness: There is no abdominal tenderness.  Genitourinary:    Comments: No blood noted on patient's pad.  Small amount of dark blood noted on GC and wet prep   Skin:    General: Skin is warm.  Neurological:     Mental Status: She is alert and oriented to person, place, and time.     MAU Course  Procedures  MDM  Wet prep & GC HIV, CBC, Hcg, ABO US OB transvaginal   A negative blood type: Rhogam given.   Assessment and Plan   A:  1. Vaginal bleeding in pregnancy, first trimester   2. Type A blood, Rh negative   3. [redacted] weeks gestation of pregnancy   4. Bacterial vaginosis      P:  Dc home  Bleeding unknown at this time Rhogam given prior to DC home Bleeding precautions Pelvic rest Keep your New OB visit on Thursday.  Rx: Flagyl    Noni Saupe I, NP 07/26/2022 7:56 AM

## 2022-07-26 NOTE — MAU Note (Signed)
.  April Young is a 26 y.o. at [redacted]w[redacted]d here in MAU reporting: VB that started yesterday with some spotting, that increased to scant to small amount of pink or red blood. Pt wearing a pad. Pt reports some bilateral, intermittent lower ABD pain that started around 2300. Pt report having extra discharge. Pt denies LOF. Last intercourse Friday night.   Onset of complaint: 07/25/22 Pain score: 5/10 Vitals:   07/26/22 0118  BP: 109/67  Pulse: 79  Resp: 18  Temp: 98.3 F (36.8 C)  SpO2: 100%      Lab orders placed from triage:

## 2022-07-27 LAB — GC/CHLAMYDIA PROBE AMP (~~LOC~~) NOT AT ARMC
Chlamydia: NEGATIVE
Comment: NEGATIVE
Comment: NORMAL
Neisseria Gonorrhea: NEGATIVE

## 2022-07-27 LAB — RH IG WORKUP (INCLUDES ABO/RH)
ABO/RH(D): A NEG
Antibody Screen: POSITIVE
Gestational Age(Wks): 6
Unit division: 0

## 2022-07-28 ENCOUNTER — Encounter: Payer: Self-pay | Admitting: Adult Health

## 2022-07-28 ENCOUNTER — Other Ambulatory Visit: Payer: Self-pay | Admitting: Obstetrics and Gynecology

## 2022-07-28 DIAGNOSIS — R760 Raised antibody titer: Secondary | ICD-10-CM

## 2022-07-28 HISTORY — DX: Raised antibody titer: R76.0

## 2022-07-28 LAB — CULTURE, OB URINE: Culture: >=100000 — AB

## 2022-07-28 MED ORDER — CEFADROXIL 500 MG PO CAPS: 500.0000 mg | ORAL_CAPSULE | Freq: Two times a day (BID) | ORAL | 0 refills | Status: AC

## 2022-07-31 ENCOUNTER — Encounter (HOSPITAL_COMMUNITY): Payer: Self-pay | Admitting: *Deleted

## 2022-07-31 ENCOUNTER — Inpatient Hospital Stay (HOSPITAL_COMMUNITY)
Admission: AD | Admit: 2022-07-31 | Discharge: 2022-07-31 | Disposition: A | Discharge status: Home / Self Care | Payer: Medicaid Other | Attending: Obstetrics & Gynecology | Admitting: Obstetrics & Gynecology

## 2022-07-31 DIAGNOSIS — Z3A01 Less than 8 weeks gestation of pregnancy: Secondary | ICD-10-CM | POA: Insufficient documentation

## 2022-07-31 DIAGNOSIS — O219 Vomiting of pregnancy, unspecified: Secondary | ICD-10-CM | POA: Insufficient documentation

## 2022-07-31 DIAGNOSIS — O99281 Endocrine, nutritional and metabolic diseases complicating pregnancy, first trimester: Secondary | ICD-10-CM | POA: Insufficient documentation

## 2022-07-31 DIAGNOSIS — E860 Dehydration: Secondary | ICD-10-CM | POA: Diagnosis not present

## 2022-07-31 LAB — URINALYSIS, ROUTINE W REFLEX MICROSCOPIC
Bilirubin Urine: NEGATIVE
Glucose, UA: NEGATIVE mg/dL
Hgb urine dipstick: NEGATIVE
Ketones, ur: 80 mg/dL — AB
Nitrite: NEGATIVE
Protein, ur: 30 mg/dL — AB
Specific Gravity, Urine: 1.025 (ref 1.005–1.030)
pH: 6 (ref 5.0–8.0)

## 2022-07-31 LAB — CBC
HCT: 42.2 % (ref 36.0–46.0)
Hemoglobin: 13.9 g/dL (ref 12.0–15.0)
MCH: 28.5 pg (ref 26.0–34.0)
MCHC: 32.9 g/dL (ref 30.0–36.0)
MCV: 86.7 fL (ref 80.0–100.0)
Platelets: 179 10*3/uL (ref 150–400)
RBC: 4.87 MIL/uL (ref 3.87–5.11)
RDW: 12.4 % (ref 11.5–15.5)
WBC: 8.8 10*3/uL (ref 4.0–10.5)
nRBC: 0 % (ref 0.0–0.2)

## 2022-07-31 LAB — BASIC METABOLIC PANEL
Anion gap: 12 (ref 5–15)
BUN: 9 mg/dL (ref 6–20)
CO2: 24 mmol/L (ref 22–32)
Calcium: 10.1 mg/dL (ref 8.9–10.3)
Chloride: 102 mmol/L (ref 98–111)
Creatinine, Ser: 0.75 mg/dL (ref 0.44–1.00)
GFR, Estimated: >60 mL/min (ref >60)
Glucose, Bld: 87 mg/dL (ref 70–99)
Potassium: 4 mmol/L (ref 3.5–5.1)
Sodium: 138 mmol/L (ref 135–145)

## 2022-07-31 MED ORDER — SODIUM CHLORIDE 0.9 % IV SOLN
12.5000 mg | Freq: Once | INTRAVENOUS | Status: AC
  Administered 2022-07-31: 12.5 mg via INTRAVENOUS
  Filled 2022-07-31: qty 0.5

## 2022-07-31 MED ORDER — DOXYLAMINE SUCCINATE (SLEEP) 25 MG PO TABS: 25.0000 mg | ORAL_TABLET | Freq: Three times a day (TID) | ORAL | 0 refills | Status: DC | PRN

## 2022-07-31 MED ORDER — PROMETHAZINE HCL 12.5 MG PO TABS: 12.5000 mg | ORAL_TABLET | Freq: Four times a day (QID) | ORAL | 0 refills | Status: DC | PRN

## 2022-07-31 MED ORDER — LACTATED RINGERS IV SOLN: Freq: Once | INTRAVENOUS | Status: AC

## 2022-07-31 MED ORDER — SCOPOLAMINE 1 MG/3DAYS TD PT72
1.0000 | MEDICATED_PATCH | Freq: Once | TRANSDERMAL | Status: DC
  Administered 2022-07-31: 1.5 mg via TRANSDERMAL
  Filled 2022-07-31: qty 1

## 2022-07-31 NOTE — Discharge Instructions (Signed)
Vomiting in First Trimester Follow these instructions at home: To help relieve your symptoms, listen to your body. Everyone is different and has different preferences. Find what works best for you. Here are some things you can try to help relieve your symptoms: Meals and snacks Eat 5-6 small meals daily instead of 3 large meals. Eating small meals and snacks can help you avoid an empty stomach. Before getting out of bed, eat a couple of crackers to avoid moving around on an empty stomach. Eat a protein-rich snack before bed. Examples include cheese and crackers, or a peanut butter sandwich made with 1 slice of whole-wheat bread and 1 tsp (5 g) of peanut butter. Eat and drink slowly. Try eating starchy foods as these are usually tolerated well. Examples include cereal, toast, bread, potatoes, pasta, rice, and pretzels. Eat at least one serving of protein with your meals and snacks. Protein options include lean meats, poultry, seafood, beans, nuts, nut butters, eggs, cheese, and yogurt. Eat or suck on things that have ginger in them. It may help to relieve nausea. Add  tsp (0.44 g) ground ginger to hot tea, or choose ginger tea.   Fluids It is important to stay hydrated. Try to: Drink small amounts of fluids often. Drink fluids 30 minutes before or after a meal to help lessen the feeling of a full stomach. Drink 100% fruit juice or an electrolyte drink. An electrolyte drink contains sodium, potassium, and chloride. Drink fluids that are cold, clear, and carbonated or sour. These include lemonade, ginger ale, lemon-lime soda, ice water, and sparkling water. Things to avoid Avoid the following: Eating foods that trigger your symptoms. These may include spicy foods, coffee, high-fat foods, very sweet foods, and acidic foods. Drinking more than 1 cup of fluid at a time. Skipping meals. Nausea can be more intense on an empty stomach. If you cannot tolerate food, do not force it. Try sucking on ice  chips or other frozen items and make up for missed calories later. Lying down within 2 hours after eating. Being exposed to environmental triggers. These may include food smells, smoky rooms, closed spaces, rooms with strong smells, warm or humid places, overly loud and noisy rooms, and rooms with motion or flickering lights. Try eating meals in a well-ventilated area that is free of strong smells. Making quick and sudden changes in your movement. Taking iron pills and multivitamins that contain iron. If you take prescription iron pills, do not stop taking them unless your health care provider approves. Preparing food. The smell of food can spoil your appetite or trigger nausea. General instructions Brush your teeth or use a mouth rinse after meals. Take over-the-counter and prescription medicines only as told by your health care provider. Follow instructions from your health care provider about eating or drinking restrictions. Talk with your health care provider about starting a supplement of vitamin B6. Continue to take your prenatal vitamins as told by your health care provider. If you are having trouble taking your prenatal vitamins, talk with your health care provider about other options. Keep all follow-up visits. This is important. Follow-up visits include prenatal visits. Contact a health care provider if: You have pain in your abdomen. You have a severe headache. You have vision problems. You are losing weight. You feel weak or dizzy. You cannot eat or drink without vomiting, especially if this goes on for a full day. Get help right away if: You cannot drink fluids without vomiting. You vomit blood. You have constant   nausea and vomiting. You are very weak. You faint. You have a fever and your symptoms suddenly get worse. Summary Making some changes to your eating habits may help relieve nausea and vomiting. This condition may be managed with lifestyle changes and medicines as  prescribed by your health care provider. If medicines do not help relieve nausea and vomiting, you may need to receive fluids through an IV at the hospital. This information is not intended to replace advice given to you by your health care provider. Make sure you discuss any questions you have with your health care provider. Document Revised: 04/22/2020 Document Reviewed: 04/22/2020 Elsevier Patient Education  2021 [employer]  

## 2022-07-31 NOTE — MAU Note (Signed)
.  April Young is a 26 y.o. at [redacted]w[redacted]d here in MAU reporting n/v for a week. Brown d/c -was seen in MAU earlier this wk for vag bleeding. Has lost wt since last visit. Has not started [facility] yet-awaiting Medicaid issues to be rectified. No pain -just weak  Onset of complaint: 1 week Pain score: 0 Vitals:   07/31/22 0503  BP: 126/78  Pulse: 79  Resp: 18  Temp: 98.4 F (36.9 C)  SpO2: 100%     FHT:n/a Lab orders placed from triage:  u/a

## 2022-07-31 NOTE — MAU Provider Note (Addendum)
Chief Complaint: Emesis During Pregnancy   Event Date/Time   First Provider Initiated Contact with Patient 07/31/22 0544        SUBJECTIVE HPI: KRISTELLE CAVALLARO is a 26 y.o. G2P1001 at [redacted]w[redacted]d by LMP who presents to maternity admissions reporting nausea and vomiting for one week  Unable to keep anything down   Weight loss.. She denies vaginal bleeding, vaginal itching/burning, urinary symptoms, h/a, dizziness, or fever/chills.    Emesis  This is a new problem. The current episode started 1 to 4 weeks ago. The problem has been unchanged. There has been no fever. Pertinent negatives include no abdominal pain, chest pain, chills, diarrhea or fever. She has tried nothing for the symptoms.   RN Note: .THAYER INABINET is a 26 y.o. at [redacted]w[redacted]d here in MAU reporting n/v for a week. Brown d/c -was seen in MAU earlier this wk for vag bleeding. Has lost wt since last visit. Has not started [facility] yet-awaiting Medicaid issues to be rectified. No pain -just weak  Onset of complaint: 1 week Pain score: 0  Past Medical History:  Diagnosis Date   Abdominal pain affecting pregnancy 03/10/2017   Abnormal antibody titer 07/28/2022   07/28/22 Anti C anti D titer was 4    Candida vaginitis 03/10/2017   Hx of trichomoniasis 11/2016   Rh negative status during pregnancy    Sepsis due to Escherichia coli ([place]) 03/2017   Urinary tract infection 03/10/2017   Past Surgical History:  Procedure Laterality Date   SKIN SURGERY     abcess   Social History   Socioeconomic History   Marital status: Significant Other    Spouse name: Not on file   Number of children: 1   Years of education: Not on file   Highest education level: Not on file  Occupational History   Not on file  Tobacco Use   Smoking status: Some Days    Types: E-cigarettes   Smokeless tobacco: Never  Vaping Use   Vaping Use: Former  Substance and Sexual Activity   Alcohol use: No   Drug use: Yes    Types: Marijuana    Comment: "every now and  then"2 weeks ago last use   Sexual activity: Yes    Birth control/protection: None  Other Topics Concern   Not on file  Social History Narrative   Not on file   Social Determinants of Health   Financial Resource Strain: High Risk (01/28/2022)   Overall Financial Resource Strain (CARDIA)    Difficulty of Paying Living Expenses: Hard  Food Insecurity: Food Insecurity Present (01/28/2022)   Hunger Vital Sign    Worried About Running Out of Food in the Last Year: Sometimes true    Ran Out of Food in the Last Year: Sometimes true  Transportation Needs: Unmet Transportation Needs (01/28/2022)   PRAPARE - Transportation    Lack of Transportation (Medical): Yes    Lack of Transportation (Non-Medical): Yes  Physical Activity: Insufficiently Active (01/28/2022)   Exercise Vital Sign    Days of Exercise per Week: 1 day    Minutes of Exercise per Session: 10 min  Stress: Stress Concern Present (01/28/2022)   [employer] [place]    Feeling of Stress : To some extent  Social Connections: Moderately Isolated (01/28/2022)   Social Connection and Isolation Panel [NHANES]    Frequency of Communication with Friends and Family: More than three times a week    Frequency of Social  Gatherings with Friends and Family: Twice a week    Attends Religious Services: Never    Database administrator or Organizations: No    Attends Engineer, structural: 1 to 4 times per year    Marital Status: Never married  Intimate Partner Violence: Not At Risk (01/28/2022)   Humiliation, Afraid, Rape, and Kick questionnaire    Fear of Current or Ex-Partner: No    Emotionally Abused: No    Physically Abused: No    Sexually Abused: No   No current facility-administered medications on file prior to encounter.   Current Outpatient Medications on File Prior to Encounter  Medication Sig Dispense Refill   metroNIDAZOLE (FLAGYL) 500 MG tablet Take 1 tablet (500 mg  total) by mouth 2 (two) times daily for 7 days. 14 tablet 0   cefadroxil (DURICEF) 500 MG capsule Take 1 capsule (500 mg total) by mouth 2 (two) times daily for 7 days. 14 capsule 0   Allergies  Allergen Reactions   Penicillins Rash and Other (See Comments)    Has patient had a PCN reaction causing immediate rash, facial/tongue/throat swelling, SOB or lightheadedness with hypotension: No Has patient had a PCN reaction causing severe rash involving mucus membranes or skin necrosis: No Has patient had a PCN reaction that required hospitalization: No Has patient had a PCN reaction occurring within the last 10 years: No If all of the above answers are "NO", then may proceed with Cephalosporin use.    I have reviewed patient's Past Medical Hx, Surgical Hx, Family Hx, Social Hx, medications and allergies.   ROS:  Review of Systems  Constitutional:  Negative for chills and fever.  Cardiovascular:  Negative for chest pain.  Gastrointestinal:  Positive for vomiting. Negative for abdominal pain and diarrhea.   Review of Systems  Other systems negative   Physical Exam  Physical Exam Patient Vitals for the past 24 hrs:  BP Temp Pulse Resp SpO2 Height Weight  07/31/22 0503 126/78 98.4 F (36.9 C) 79 18 100 % 5\' 8"  (1.727 m) 44.3 kg   Constitutional: Well-developed, well-nourished female in no acute distress.  Cardiovascular: normal rate Respiratory: normal effort GI: Abd soft, non-tender. Pos BS x 4 MS: Extremities nontender, no edema, normal ROM Neurologic: Alert and oriented x 4.  GU: Neg CVAT.  LAB RESULTS Results for orders placed or performed during the hospital encounter of 07/31/22 (from the past 24 hour(s))  Urinalysis, Routine w reflex microscopic Urine, Clean Catch     Status: Abnormal   Collection Time: 07/31/22  5:20 AM  Result Value Ref Range   Color, Urine AMBER (A) YELLOW   APPearance HAZY (A) CLEAR   Specific Gravity, Urine 1.025 1.005 - 1.030   pH 6.0 5.0 - 8.0    Glucose, UA NEGATIVE NEGATIVE mg/dL   Hgb urine dipstick NEGATIVE NEGATIVE   Bilirubin Urine NEGATIVE NEGATIVE   Ketones, ur 80 (A) NEGATIVE mg/dL   Protein, ur 30 (A) NEGATIVE mg/dL   Nitrite NEGATIVE NEGATIVE   Leukocytes,Ua TRACE (A) NEGATIVE   RBC / HPF 0-5 0 - 5 RBC/hpf   WBC, UA 21-50 0 - 5 WBC/hpf   Bacteria, UA RARE (A) NONE SEEN   Squamous Epithelial / LPF 11-20 0 - 5   Mucus PRESENT   Basic metabolic panel     Status: None   Collection Time: 07/31/22  6:51 AM  Result Value Ref Range   Sodium 138 135 - 145 mmol/L   Potassium 4.0 3.5 -  5.1 mmol/L   Chloride 102 98 - 111 mmol/L   CO2 24 22 - 32 mmol/L   Glucose, Bld 87 70 - 99 mg/dL   BUN 9 6 - 20 mg/dL   Creatinine, Ser 7.67 0.44 - 1.00 mg/dL   Calcium 20.9 8.9 - 47.0 mg/dL   GFR, Estimated >96 >28 mL/min   Anion gap 12 5 - 15  CBC     Status: None   Collection Time: 07/31/22  6:51 AM  Result Value Ref Range   WBC 8.8 4.0 - 10.5 K/uL   RBC 4.87 3.87 - 5.11 MIL/uL   Hemoglobin 13.9 12.0 - 15.0 g/dL   HCT 36.6 29.4 - 76.5 %   MCV 86.7 80.0 - 100.0 fL   MCH 28.5 26.0 - 34.0 pg   MCHC 32.9 30.0 - 36.0 g/dL   RDW 46.5 03.5 - 46.5 %   Platelets 179 150 - 400 K/uL   nRBC 0.0 0.0 - 0.2 %     --/--/A NEG (10/15 0143)  IMAGING Prior US showed IUP  MAU Management/MDM: I have reviewed the triage vital signs and the nursing notes.   Pertinent labs & imaging results that were available during my care of the patient were reviewed by me and considered in my medical decision making (see chart for details).      I have reviewed her medical records including past results, notes and treatments.   Ordered cbc, cmet, IVF and Phenergan.  Also added Scop patch.   Treatments in MAU included IVF, Scopolamine, Phenergan.   ASSESSMENT Single IUP at [redacted]w[redacted]d Nausea and vomiting Dehydration  PLAN Care turned over to oncoming provider   Wynelle Bourgeois CNM, MSN Certified Nurse-Midwife 07/31/2022  5:45 AM  Meds ordered this  encounter  Medications   lactated ringers infusion   promethazine (PHENERGAN) 12.5 mg in sodium chloride 0.9 % 50 mL IVPB   scopolamine (TRANSDERM-SCOP) 1 MG/3DAYS 1.5 mg   lactated ringers infusion   promethazine (PHENERGAN) 12.5 MG tablet    Sig: Take 1 tablet (12.5 mg total) by mouth every 6 (six) hours as needed for nausea or vomiting.    Dispense:  30 tablet    Refill:  0    Order Specific Question:   Supervising Provider    Answer:   Myna Hidalgo [[id number]]   doxylamine, Sleep, (UNISOM) 25 MG tablet    Sig: Take 1 tablet (25 mg total) by mouth every 8 (eight) hours as needed.    Dispense:  30 tablet    Refill:  0    Order Specific Question:   Supervising Provider    Answer:   Myna Hidalgo [[id number]]   -Care assumed of patient at 0800 -Patient tolerating PO intake prior to discharge -Reviewed recommendations for dietary changes to reduce episodes of vomiting -Discharge home in stable condition  Clayton Bibles, MSA, MSN, CNM Certified Nurse Midwife, Biochemist, clinical for [employer], [facility]

## 2022-08-22 ENCOUNTER — Encounter: Payer: Self-pay | Admitting: Advanced Practice Midwife

## 2022-08-25 ENCOUNTER — Ambulatory Visit: Payer: Medicaid Other

## 2022-08-27 ENCOUNTER — Ambulatory Visit: Payer: Medicaid Other | Admitting: Adult Health

## 2022-08-27 ENCOUNTER — Other Ambulatory Visit: Payer: Self-pay | Admitting: Family Medicine

## 2022-08-27 MED ORDER — METRONIDAZOLE 500 MG PO TABS: 500.0000 mg | ORAL_TABLET | Freq: Two times a day (BID) | ORAL | 0 refills | Status: DC

## 2022-08-27 MED ORDER — CEFADROXIL 500 MG PO CAPS: 500.0000 mg | ORAL_CAPSULE | Freq: Two times a day (BID) | ORAL | 0 refills | Status: DC

## 2022-08-27 NOTE — Progress Notes (Signed)
Patient called the MAU regarding need for antibiotics for UTI.  She was seen in October on 10/15 and diagnosed with a UTI.  Antibiotics were sent in.  She was also diagnosed with bacterial vaginosis and metronidazole was sent into her pharmacy.  She reports that she did not pick up either medications and has not taken any medication since she was previously seen.  She spoke with the clinic on 11/13 and was supposed to drop off a urine culture but did not do so.  Will refill the patient's original prescriptions for Duricef and metronidazole.  If symptoms do not improve patient will need to return for urine sample and evaluation.  No further questions or concerns.

## 2022-09-02 ENCOUNTER — Other Ambulatory Visit: Payer: Self-pay | Admitting: Obstetrics & Gynecology

## 2022-09-02 DIAGNOSIS — Z3682 Encounter for antenatal screening for nuchal translucency: Secondary | ICD-10-CM

## 2022-09-07 ENCOUNTER — Ambulatory Visit: Payer: Medicaid Other | Admitting: *Deleted

## 2022-09-07 ENCOUNTER — Ambulatory Visit (INDEPENDENT_AMBULATORY_CARE_PROVIDER_SITE_OTHER): Payer: Medicaid Other

## 2022-09-07 ENCOUNTER — Other Ambulatory Visit: Payer: Self-pay | Admitting: Obstetrics & Gynecology

## 2022-09-07 ENCOUNTER — Encounter: Payer: Self-pay | Admitting: Advanced Practice Midwife

## 2022-09-07 ENCOUNTER — Ambulatory Visit (INDEPENDENT_AMBULATORY_CARE_PROVIDER_SITE_OTHER): Payer: Medicaid Other | Admitting: Advanced Practice Midwife

## 2022-09-07 VITALS — BP 116/68 | HR 89 | Wt 107.0 lb

## 2022-09-07 DIAGNOSIS — Z3682 Encounter for antenatal screening for nuchal translucency: Secondary | ICD-10-CM

## 2022-09-07 DIAGNOSIS — Z3480 Encounter for supervision of other normal pregnancy, unspecified trimester: Secondary | ICD-10-CM | POA: Diagnosis not present

## 2022-09-07 DIAGNOSIS — Z3A12 12 weeks gestation of pregnancy: Secondary | ICD-10-CM | POA: Diagnosis not present

## 2022-09-07 DIAGNOSIS — Z6791 Unspecified blood type, Rh negative: Secondary | ICD-10-CM | POA: Diagnosis not present

## 2022-09-07 DIAGNOSIS — N83202 Unspecified ovarian cyst, left side: Secondary | ICD-10-CM

## 2022-09-07 DIAGNOSIS — Z3490 Encounter for supervision of normal pregnancy, unspecified, unspecified trimester: Secondary | ICD-10-CM | POA: Insufficient documentation

## 2022-09-07 DIAGNOSIS — O26899 Other specified pregnancy related conditions, unspecified trimester: Secondary | ICD-10-CM | POA: Diagnosis not present

## 2022-09-07 MED ORDER — ONDANSETRON 4 MG PO TBDP: 4.0000 mg | ORAL_TABLET | Freq: Four times a day (QID) | ORAL | 2 refills | Status: DC | PRN

## 2022-09-07 MED ORDER — GLYCOPYRROLATE 1 MG PO TABS: 1.0000 mg | ORAL_TABLET | Freq: Three times a day (TID) | ORAL | 6 refills | Status: DC

## 2022-09-07 MED ORDER — DOXYLAMINE-PYRIDOXINE 10-10 MG PO TBEC: DELAYED_RELEASE_TABLET | ORAL | 6 refills | Status: DC

## 2022-09-07 MED ORDER — BLOOD PRESSURE MONITOR MISC: 0 refills | Status: DC

## 2022-09-07 NOTE — Patient Instructions (Signed)
April Young, I greatly value your feedback.  If you receive a survey following your visit with [place] today, we appreciate you taking the time to fill it out.  Thanks, April Beams, DNP, CNM  [facility] HAS MOVED!!! It is now [facility] ([address] [place], [place] [address]) Entrance located off of [address] Free 24/7 valet parking   Nausea & Vomiting Have saltine crackers or pretzels by your bed and eat a few bites before you raise your head out of bed in the morning Eat small frequent meals throughout the day instead of large meals Drink plenty of fluids throughout the day to stay hydrated, just don't drink a lot of fluids with your meals.  This can make your stomach fill up faster making you feel sick Do not brush your teeth right after you eat Products with real ginger are good for nausea, like ginger ale and ginger hard candy Make sure it says made with real ginger! Sucking on sour candy like lemon heads is also good for nausea If your prenatal vitamins make you nauseated, take them at night so you will sleep through the nausea Sea Bands If you feel like you need medicine for the nausea & vomiting please let us know If you are unable to keep any fluids or food down please let us know   Constipation Drink plenty of fluid, preferably water, throughout the day Eat foods high in fiber such as fruits, vegetables, and grains Exercise, such as walking, is a good way to keep your bowels regular Drink warm fluids, especially warm prune juice, or decaf coffee Eat a 1/2 cup of real oatmeal (not instant), 1/2 cup applesauce, and 1/2-1 cup warm prune juice every day If needed, you may take Colace (docusate sodium) stool softener once or twice a day to help keep the stool soft.  If you still are having problems with constipation, you may take Miralax once daily as needed to help keep your bowels regular.   Home Blood Pressure Monitoring for  Patients   Your provider has recommended that you check your blood pressure (BP) at least once a week at home. If you do not have a blood pressure cuff at home, one will be provided for you. Contact your provider if you have not received your monitor within 1 week.   Helpful Tips for Accurate Home Blood Pressure Checks  Don't smoke, exercise, or drink caffeine 30 minutes before checking your BP Use the restroom before checking your BP (a full bladder can raise your pressure) Relax in a comfortable upright chair Feet on the ground Left arm resting comfortably on a flat surface at the level of your heart Legs uncrossed Back supported Sit quietly and don't talk Place the cuff on your bare arm Adjust snuggly, so that only two fingertips can fit between your skin and the top of the cuff Check 2 readings separated by at least one minute Keep a log of your BP readings For a visual, please reference this diagram: http://ccnc.care/bpdiagram  Provider Name: [facility]     Phone: [phone]  Zone 1: ALL CLEAR  Continue to monitor your symptoms:  BP reading is less than 140 (top number) or less than 90 (bottom number)  No right upper stomach pain No headaches or seeing spots No feeling nauseated or throwing up No swelling in face and hands  Zone 2: CAUTION Call your doctor's office for any of the following:  BP reading is greater than 140 (top number) or greater than 90 (bottom number)  Stomach pain under your ribs in the middle or right side Headaches or seeing spots Feeling nauseated or throwing up Swelling in face and hands  Zone 3: EMERGENCY  Seek immediate medical care if you have any of the following:  BP reading is greater than160 (top number) or greater than 110 (bottom number) Severe headaches not improving with Tylenol Serious difficulty catching your breath Any worsening symptoms from Zone 2    First Trimester of Pregnancy The first trimester of pregnancy is from  week 1 until the end of week 12 (months 1 through 3). A week after a sperm fertilizes an egg, the egg will implant on the wall of the uterus. This embryo will begin to develop into a baby. Genes from you and your partner are forming the baby. The female genes determine whether the baby is a boy or a girl. At 6-8 weeks, the eyes and face are formed, and the heartbeat can be seen on ultrasound. At the end of 12 weeks, all the baby's organs are formed.  Now that you are pregnant, you will want to do everything you can to have a healthy baby. Two of the most important things are to get good prenatal care and to follow your health care provider's instructions. Prenatal care is all the medical care you receive before the baby's birth. This care will help prevent, find, and treat any problems during the pregnancy and childbirth. BODY CHANGES Your body goes through many changes during pregnancy. The changes vary from woman to woman.  You may gain or lose a couple of pounds at first. You may feel sick to your stomach (nauseous) and throw up (vomit). If the vomiting is uncontrollable, call your health care provider. You may tire easily. You may develop headaches that can be relieved by medicines approved by your health care provider. You may urinate more often. Painful urination may mean you have a bladder infection. You may develop heartburn as a result of your pregnancy. You may develop constipation because certain hormones are causing the muscles that push waste through your intestines to slow down. You may develop hemorrhoids or swollen, bulging veins (varicose veins). Your breasts may begin to grow larger and become tender. Your nipples may stick out more, and the tissue that surrounds them (areola) may become darker. Your gums may bleed and may be sensitive to brushing and flossing. Dark spots or blotches (chloasma, mask of pregnancy) may develop on your face. This will likely fade after the baby is  born. Your menstrual periods will stop. You may have a loss of appetite. You may develop cravings for certain kinds of food. You may have changes in your emotions from day to day, such as being excited to be pregnant or being concerned that something may go wrong with the pregnancy and baby. You may have more vivid and strange dreams. You may have changes in your hair. These can include thickening of your hair, rapid growth, and changes in texture. Some women also have hair loss during or after pregnancy, or hair that feels dry or thin. Your hair will most likely return to normal after your baby is born. WHAT TO EXPECT AT YOUR PRENATAL VISITS During a routine prenatal visit: You will be weighed to make sure you and the baby are growing normally. Your blood pressure will be taken. Your abdomen will be measured to track your baby's growth. The fetal  heartbeat will be listened to starting around week 10 or 12 of your pregnancy. Test results from any previous visits will be discussed. Your health care provider may ask you: How you are feeling. If you are feeling the baby move. If you have had any abnormal symptoms, such as leaking fluid, bleeding, severe headaches, or abdominal cramping. If you have any questions. Other tests that may be performed during your first trimester include: Blood tests to find your blood type and to check for the presence of any previous infections. They will also be used to check for low iron levels (anemia) and Rh antibodies. Later in the pregnancy, blood tests for diabetes will be done along with other tests if problems develop. Urine tests to check for infections, diabetes, or protein in the urine. An ultrasound to confirm the proper growth and development of the baby. An amniocentesis to check for possible genetic problems. Fetal screens for spina bifida and Down syndrome. You may need other tests to make sure you and the baby are doing well. HOME CARE  INSTRUCTIONS  Medicines Follow your health care provider's instructions regarding medicine use. Specific medicines may be either safe or unsafe to take during pregnancy. Take your prenatal vitamins as directed. If you develop constipation, try taking a stool softener if your health care provider approves. Diet Eat regular, well-balanced meals. Choose a variety of foods, such as meat or vegetable-based protein, fish, milk and low-fat dairy products, vegetables, fruits, and whole grain breads and cereals. Your health care provider will help you determine the amount of weight gain that is right for you. Avoid raw meat and uncooked cheese. These carry germs that can cause birth defects in the baby. Eating four or five small meals rather than three large meals a day may help relieve nausea and vomiting. If you start to feel nauseous, eating a few soda crackers can be helpful. Drinking liquids between meals instead of during meals also seems to help nausea and vomiting. If you develop constipation, eat more high-fiber foods, such as fresh vegetables or fruit and whole grains. Drink enough fluids to keep your urine clear or pale yellow. Activity and Exercise Exercise only as directed by your health care provider. Exercising will help you: Control your weight. Stay in shape. Be prepared for labor and delivery. Experiencing pain or cramping in the lower abdomen or low back is a good sign that you should stop exercising. Check with your health care provider before continuing normal exercises. Try to avoid standing for long periods of time. Move your legs often if you must stand in one place for a long time. Avoid heavy lifting. Wear low-heeled shoes, and practice good posture. You may continue to have sex unless your health care provider directs you otherwise. Relief of Pain or Discomfort Wear a good support bra for breast tenderness.   Take warm sitz baths to soothe any pain or discomfort caused by  hemorrhoids. Use hemorrhoid cream if your health care provider approves.   Rest with your legs elevated if you have leg cramps or low back pain. If you develop varicose veins in your legs, wear support hose. Elevate your feet for 15 minutes, 3-4 times a day. Limit salt in your diet. Prenatal Care Schedule your prenatal visits by the twelfth week of pregnancy. They are usually scheduled monthly at first, then more often in the last 2 months before delivery. Write down your questions. Take them to your prenatal visits. Keep all your prenatal visits as directed  by your health care provider. Safety Wear your seat belt at all times when driving. Make a list of emergency phone numbers, including numbers for family, friends, the hospital, and police and fire departments. General Tips Ask your health care provider for a referral to a local prenatal education class. Begin classes no later than at the beginning of month 6 of your pregnancy. Ask for help if you have counseling or nutritional needs during pregnancy. Your health care provider can offer advice or refer you to specialists for help with various needs. Do not use hot tubs, steam rooms, or saunas. Do not douche or use tampons or scented sanitary pads. Do not cross your legs for long periods of time. Avoid cat litter boxes and soil used by cats. These carry germs that can cause birth defects in the baby and possibly loss of the fetus by miscarriage or stillbirth. Avoid all smoking, herbs, alcohol, and medicines not prescribed by your health care provider. Chemicals in these affect the formation and growth of the baby. Schedule a dentist appointment. At home, brush your teeth with a soft toothbrush and be gentle when you floss. SEEK MEDICAL CARE IF:  You have dizziness. You have mild pelvic cramps, pelvic pressure, or nagging pain in the abdominal area. You have persistent nausea, vomiting, or diarrhea. You have a bad smelling vaginal  discharge. You have pain with urination. You notice increased swelling in your face, hands, legs, or ankles. SEEK IMMEDIATE MEDICAL CARE IF:  You have a fever. You are leaking fluid from your vagina. You have spotting or bleeding from your vagina. You have severe abdominal cramping or pain. You have rapid weight gain or loss. You vomit blood or material that looks like coffee grounds. You are exposed to [place] measles and have never had them. You are exposed to fifth disease or chickenpox. You develop a severe headache. You have shortness of breath. You have any kind of trauma, such as from a fall or a car accident. Document Released: 09/22/2001 Document Revised: 02/12/2014 Document Reviewed: 08/08/2013 [facility] Patient Information 2015 [place], [place]. This information is not intended to replace advice given to you by your health care provider. Make sure you discuss any questions you have with your health care provider.  Coronavirus (COVID-19) Are you at risk?  Are you at risk for the Coronavirus (COVID-19)?  To be considered HIGH RISK for Coronavirus (COVID-19), you have to meet the following criteria:  Traveled to [place], [place], [place], [place] or [place];  and have fever, cough, and shortness of breath within the last 2 weeks of travel OR Been in close contact with a person diagnosed with COVID-19 within the last 2 weeks and have fever, cough, and shortness of breath IF YOU DO NOT MEET THESE CRITERIA, YOU ARE CONSIDERED LOW RISK FOR COVID-19.  What to do if you are HIGH RISK for COVID-19?  If you are having a medical emergency, call 911. Seek medical care right away. Before you go to a doctor's office, urgent care or emergency department, call ahead and tell them about your recent travel, contact with someone diagnosed with COVID-19, and your symptoms. You should receive instructions from your physician's office regarding next steps of care.  When you arrive at healthcare provider,  tell the healthcare staff immediately you have returned from visiting [place], [place], [place], [place] or [place]; in the last two weeks or you have been in close contact with a person diagnosed with COVID-19 in the last 2 weeks.  Tell the health care staff about your symptoms: fever, cough and shortness of breath. After you have been seen by a medical provider, you will be either: Tested for (COVID-19) and discharged home on quarantine except to seek medical care if symptoms worsen, and asked to  Stay home and avoid contact with others until you get your results (4-5 days)  Avoid travel on public transportation if possible (such as bus, train, or airplane) or Sent to the Emergency Department by EMS for evaluation, COVID-19 testing, and possible admission depending on your condition and test results.  What to do if you are LOW RISK for COVID-19?  Reduce your risk of any infection by using the same precautions used for avoiding the common cold or flu:  Wash your hands often with soap and warm water for at least 20 seconds.  If soap and water are not readily available, use an alcohol-based hand sanitizer with at least 60% alcohol.  If coughing or sneezing, cover your mouth and nose by coughing or sneezing into the elbow areas of your shirt or coat, into a tissue or into your sleeve (not your hands). Avoid shaking hands with others and consider head nods or verbal greetings only. Avoid touching your eyes, nose, or mouth with unwashed hands.  Avoid close contact with people who are sick. Avoid places or events with large numbers of people in one location, like concerts or sporting events. Carefully consider travel plans you have or are making. If you are planning any travel outside or inside the [place], visit the CDC's Travelers' Health webpage for the latest health notices. If you have some symptoms but not all symptoms, continue to monitor at home and seek medical attention if your symptoms worsen. If  you are having a medical emergency, call 911.   ADDITIONAL HEALTHCARE OPTIONS FOR [place] / e-Visit: [email or web address]         [facility]: [facility]: [id number]                   [facility]: [phone]     Safe Medications in Pregnancy   Acne: Benzoyl Peroxide Salicylic Acid  Backache/Headache: Tylenol: 2 regular strength every 4 hours OR              2 Extra strength every 6 hours  Colds/Coughs/Allergies: Benadryl (alcohol free) 25 mg every 6 hours as needed Breath right strips Claritin Cepacol throat lozenges Chloraseptic throat spray Cold-Eeze- up to three times per day Cough drops, alcohol free Flonase (by prescription only) Guaifenesin Mucinex Robitussin DM (plain only, alcohol free) Saline nasal spray/drops Sudafed (pseudoephedrine) & Actifed ** use only after [redacted] weeks gestation and if you do not have high blood pressure Tylenol Vicks Vaporub Zinc lozenges Zyrtec   Constipation: Colace Ducolax suppositories Fleet enema Glycerin suppositories Metamucil Milk of magnesia Miralax Senokot Smooth move tea  Diarrhea: Kaopectate Imodium A-D  *NO pepto Bismol  Hemorrhoids: Anusol Anusol HC Preparation H Tucks  Indigestion: Tums Maalox Mylanta Zantac  Pepcid  Insomnia: Benadryl (alcohol free) 36m every 6 hours as needed Tylenol PM Unisom, no Gelcaps  Leg Cramps: Tums MagGel  Nausea/Vomiting:  Bonine Dramamine Emetrol Ginger extract Sea bands Meclizine  Nausea medication to take during pregnancy:  Unisom (doxylamine succinate 25 mg tablets) Take one tablet daily at bedtime. If symptoms are not adequately controlled, the dose can be increased to a maximum recommended dose of two tablets daily (1/2 tablet in  the morning, 1/2 tablet mid-afternoon and one at bedtime). Vitamin B6 160m tablets. Take one  tablet twice a day (up to 200 mg per day).  Skin Rashes: Aveeno products Benadryl cream or 250mevery 6 hours as needed Calamine Lotion 1% cortisone cream  Yeast infection: Gyne-lotrimin 7 Monistat 7   **If taking multiple medications, please check labels to avoid duplicating the same active ingredients **take medication as directed on the label ** Do not exceed 4000 mg of tylenol in 24 hours **Do not take medications that contain aspirin or ibuprofen

## 2022-09-07 NOTE — Progress Notes (Signed)
INITIAL OBSTETRICAL VISIT Patient name: April Young MRN [id number]  Date of birth: 1995/11/10 Chief Complaint:   Initial Prenatal Visit (Spitting a lot )  History of Present Illness:   April Young is a 26 y.o. G19P1001  female at [redacted]w[redacted]d by [place] at 6 weeks with an Estimated Date of Delivery: 03/16/23 being seen today for her initial obstetrical visit.   Her obstetrical history is significant for  FGR, is A neg, has ptylism now, hx of gestational thrombocytopenia .   Today she reports nausea and ptylism  RX diclegis and robinul.     09/07/2022    2:05 PM 01/28/2022   11:06 AM 01/19/2017    2:01 PM  Depression screen PHQ 2/9  Decreased Interest 3 2 1   Down, Depressed, Hopeless 2 1 0  PHQ - 2 Score 5 3 1   Altered sleeping 3 1 1   Tired, decreased energy 3 1 1   Change in appetite 2 3 1   Feeling bad or failure about yourself  0 1 0  Trouble concentrating 0 1 0  Moving slowly or fidgety/restless 0 0 0  Suicidal thoughts 0 0 0  PHQ-9 Score 13 10 4     Patient's last menstrual period was 01/05/2022 (exact date). Last pap 01/28/22. Results were: normal Review of Systems:   Pertinent items are noted in HPI Denies cramping/contractions, leakage of fluid, vaginal bleeding, abnormal vaginal discharge w/ itching/odor/irritation, headaches, visual changes, shortness of breath, chest pain, abdominal pain, severe nausea/vomiting, or problems with urination or bowel movements unless otherwise stated above.  Pertinent History Reviewed:  Reviewed past medical,surgical, social, obstetrical and family history.  Reviewed problem list, medications and allergies. OB History  Gravida Para Term Preterm AB Living  2 1 1  0 0 1  SAB IAB Ectopic Multiple Live Births  0     0 1    # Outcome Date GA Lbr Len/2nd Weight Sex Delivery Anes PTL Lv  2 Current           1 Term 07/06/17 [redacted]w[redacted]d 02:57 / 00:17 6 lb 13.4 oz (3.1 kg) F Vag-Spont Local N LIV     Birth Comments: WNL     Complications: Gestational  thrombocytopenia (HCC), Fetal growth restriction antepartum   Physical Assessment:   Vitals:   09/07/22 1402  BP: 116/68  Pulse: 89  Weight: 107 lb (48.5 kg)  Body mass index is 16.27 kg/m.       Physical Examination:  General appearance - well appearing, and in no distress  Mental status - alert, oriented to person, place, and time  Psych:  She has a normal mood and affect  Skin - warm and dry, normal color, no suspicious lesions noted  Chest - effort normal  Heart - normal rate and regular rhythm  Abdomen - soft, nontender  Extremities:  No swelling or varicosities noted    TODAY'S NT   12+6 wks,measurements c/w dates,CRL 72.62 mm,FHR 161 bpm,normal right ovary,simple left unilocular ovarian cyst with arterial and venous flow,NB present,NT 1.7 mm,anterior placenta     No results found for this or any previous visit (from the past 24 hour(s)).  Assessment & Plan:  1) Low-Risk Pregnancy G2P1001 at [redacted]w[redacted]d with an Estimated Date of Delivery: 03/16/23   2) Initial OB visit  3) hx FGR;  EFW 28/32 weeks  Meds:  Meds ordered this encounter  Medications   Blood Pressure Monitor MISC    Sig: For regular home bp monitoring during pregnancy  Dispense:  1 each    Refill:  0    Z34.81 Please mail to patient   Doxylamine-Pyridoxine (DICLEGIS) 10-10 MG TBEC    Sig: Take 2 qhs; may also take one in am and one in afternoon prn nausea    Dispense:  120 tablet    Refill:  6    Order Specific Question:   Supervising Provider    Answer:   Duane Lope H [[id number]]   ondansetron (ZOFRAN-ODT) 4 MG disintegrating tablet    Sig: Take 1 tablet (4 mg total) by mouth every 6 (six) hours as needed for nausea.    Dispense:  30 tablet    Refill:  2    Order Specific Question:   Supervising Provider    Answer:   Duane Lope H [[id number]]   glycopyrrolate (ROBINUL) 1 MG tablet    Sig: Take 1 tablet (1 mg total) by mouth 3 (three) times daily.    Dispense:  90 tablet    Refill:  6    Order  Specific Question:   Supervising Provider    Answer:   Duane Lope H [[id number]]    Initial labs obtained Continue prenatal vitamins Reviewed n/v relief measures and warning s/s to report Reviewed recommended weight gain based on pre-gravid BMI Encouraged well-balanced diet Genetic & carrier screening discussed: requests Panorama, NT/IT, and Horizon , declines AFP Ultrasound discussed; fetal survey: requested CCNC completed> form faxed if has or is planning to apply for medicaid The nature of [facility] [employer] with multiple MDs and other Advanced Practice Providers was explained to patient; also emphasized that fellows, residents, and students are part of our team. Doesn't have a home bp cuff. Rx faxed. Check bp weekly, let us know if >140/90.        Scarlette Calico Cresenzo-Dishmon 3:58 PM

## 2022-09-07 NOTE — Addendum Note (Signed)
Addended by: Moss Mc on: 09/07/2022 04:45 PM   Modules accepted: Orders

## 2022-09-07 NOTE — Progress Notes (Signed)
[place] 12+6 wks,measurements c/w dates,CRL 72.62 mm,FHR 161 bpm,normal right ovary,simple left unilocular ovarian cyst with arterial and venous flow,NB present,NT 1.7 mm,anterior placenta

## 2022-09-09 LAB — URINE CULTURE

## 2022-09-09 LAB — GC/CHLAMYDIA PROBE AMP
Chlamydia trachomatis, NAA: NEGATIVE
Neisseria Gonorrhoeae by PCR: NEGATIVE

## 2022-09-10 ENCOUNTER — Ambulatory Visit (INDEPENDENT_AMBULATORY_CARE_PROVIDER_SITE_OTHER): Payer: Medicaid Other | Admitting: *Deleted

## 2022-09-10 ENCOUNTER — Other Ambulatory Visit: Payer: Self-pay | Admitting: Advanced Practice Midwife

## 2022-09-10 ENCOUNTER — Encounter: Payer: Self-pay | Admitting: Advanced Practice Midwife

## 2022-09-10 DIAGNOSIS — R8271 Bacteriuria: Secondary | ICD-10-CM | POA: Insufficient documentation

## 2022-09-10 DIAGNOSIS — O2341 Unspecified infection of urinary tract in pregnancy, first trimester: Secondary | ICD-10-CM

## 2022-09-10 MED ORDER — CEFTRIAXONE SODIUM 250 MG IJ SOLR
250.0000 mg | Freq: Once | INTRAMUSCULAR | Status: AC
  Administered 2022-09-10: 250 mg via INTRAMUSCULAR

## 2022-09-10 MED ORDER — NITROFURANTOIN MONOHYD MACRO 100 MG PO CAPS: 100.0000 mg | ORAL_CAPSULE | Freq: Two times a day (BID) | ORAL | 0 refills | Status: DC

## 2022-09-10 NOTE — Progress Notes (Signed)
   NURSE VISIT- INJECTION  SUBJECTIVE:  April Young is a 26 y.o. G72P1001 female here for a Rocephin for per provider order. She is [redacted]w[redacted]d pregnant.   OBJECTIVE:  LMP 01/05/2022 (Exact Date)   Appears well, in no apparent distress  Injection administered in: Right upper quad. gluteus  Meds ordered this encounter  Medications   cefTRIAXone (ROCEPHIN) injection 250 mg    ASSESSMENT: Pregnancy [redacted]w[redacted]d Rocephin for per provider order PLAN: Follow-up: as scheduled   Malachy Mood  09/10/2022 3:11 PM

## 2022-09-14 LAB — PANORAMA PRENATAL TEST FULL PANEL:PANORAMA TEST PLUS 5 ADDITIONAL MICRODELETIONS: FETAL FRACTION: 17

## 2022-09-19 LAB — HORIZON CUSTOM: REPORT SUMMARY: POSITIVE — AB

## 2022-09-21 ENCOUNTER — Encounter: Payer: Self-pay | Admitting: Advanced Practice Midwife

## 2022-09-21 DIAGNOSIS — R898 Other abnormal findings in specimens from other organs, systems and tissues: Secondary | ICD-10-CM | POA: Insufficient documentation

## 2022-10-07 ENCOUNTER — Ambulatory Visit (INDEPENDENT_AMBULATORY_CARE_PROVIDER_SITE_OTHER): Payer: Medicaid Other | Admitting: Advanced Practice Midwife

## 2022-10-07 VITALS — BP 86/54 | HR 82 | Wt 120.0 lb

## 2022-10-07 DIAGNOSIS — Z363 Encounter for antenatal screening for malformations: Secondary | ICD-10-CM

## 2022-10-07 DIAGNOSIS — Z1379 Encounter for other screening for genetic and chromosomal anomalies: Secondary | ICD-10-CM | POA: Diagnosis not present

## 2022-10-07 DIAGNOSIS — Z3A17 17 weeks gestation of pregnancy: Secondary | ICD-10-CM

## 2022-10-07 DIAGNOSIS — R8271 Bacteriuria: Secondary | ICD-10-CM

## 2022-10-07 DIAGNOSIS — O2341 Unspecified infection of urinary tract in pregnancy, first trimester: Secondary | ICD-10-CM | POA: Diagnosis not present

## 2022-10-07 DIAGNOSIS — Z302 Encounter for sterilization: Secondary | ICD-10-CM | POA: Insufficient documentation

## 2022-10-07 DIAGNOSIS — Z3480 Encounter for supervision of other normal pregnancy, unspecified trimester: Secondary | ICD-10-CM

## 2022-10-07 NOTE — Progress Notes (Deleted)
   LOW-RISK PREGNANCY VISIT Patient name: April Young MRN [id number]  Date of birth: 03/10/1996 Chief Complaint:   Routine Prenatal Visit  History of Present Illness:   April Young is a 26 y.o. G68P1001 female at [redacted]w[redacted]d with an Estimated Date of Delivery: 03/16/23 being seen today for ongoing management of a low-risk pregnancy.  Today she reports  still having ptyalism- hasn't picked up meds yet . Contractions: Not present. Vag. Bleeding: None.  Movement: Absent. denies leaking of fluid. Review of Systems:   Pertinent items are noted in HPI Denies abnormal vaginal discharge w/ itching/odor/irritation, headaches, visual changes, shortness of breath, chest pain, abdominal pain, severe nausea/vomiting, or problems with urination or bowel movements unless otherwise stated above. Pertinent History Reviewed:  Reviewed past medical,surgical, social, obstetrical and family history.  Reviewed problem list, medications and allergies. Physical Assessment:   Vitals:   10/07/22 1329  BP: (!) 86/54  Pulse: 82  Weight: 120 lb (54.4 kg)  Body mass index is 18.25 kg/m.        Physical Examination:   General appearance: Well appearing, and in no distress  Mental status: Alert, oriented to person, place, and time  Skin: Warm & dry  Cardiovascular: Normal heart rate noted  Respiratory: Normal respiratory effort, no distress  Abdomen: Soft, gravid, nontender  Pelvic: Cervical exam deferred         Extremities:    Fetal Status: Fetal Heart Rate (bpm): 158   Movement: Absent    No results found for this or any previous visit (from the past 24 hour(s)).  Assessment & Plan:  1) Low-risk pregnancy G2P1001 at [redacted]w[redacted]d with an Estimated Date of Delivery: 03/16/23   2) Ptyalism, hasn't tried meds yet> plans to pick up  3)    Meds: No orders of the defined types were placed in this encounter.  Labs/procedures today: ***  Plan:  Continue routine obstetrical care ***  Reviewed: {Blank  single:19197::"Term","Preterm"} labor symptoms and general obstetric precautions including but not limited to vaginal bleeding, contractions, leaking of fluid and fetal movement were reviewed in detail with the patient.  All questions were answered. *** home bp cuff. Rx faxed to ***. Check bp weekly, let us know if >140/90.   Follow-up: Return in about 3 weeks (around 10/28/2022) for LROB, [place]: Anatomy, in person.  Orders Placed This Encounter  Procedures   Urine Culture   US OB Comp + 14 Wk   INTEGRATED 2   Arabella Merles [facility] 10/07/2022 1:54 PM

## 2022-10-07 NOTE — Progress Notes (Signed)
   LOW-RISK PREGNANCY VISIT Patient name: April Young MRN [id number]  Date of birth: [birth year] Chief Complaint:   Routine Prenatal Visit  History of Present Illness:   April Young is a 26 y.o. G64P1001 female at [redacted]w[redacted]d with an Estimated Date of Delivery: 03/16/23 being seen today for ongoing management of a low-risk pregnancy.  Today she reports  still having spitting- hasn't gotten meds yet . Contractions: Not present. Vag. Bleeding: None.  Movement: Absent. denies leaking of fluid. Review of Systems:   Pertinent items are noted in HPI Denies abnormal vaginal discharge w/ itching/odor/irritation, headaches, visual changes, shortness of breath, chest pain, abdominal pain, severe nausea/vomiting, or problems with urination or bowel movements unless otherwise stated above. Pertinent History Reviewed:  Reviewed past medical,surgical, social, obstetrical and family history.  Reviewed problem list, medications and allergies. Physical Assessment:   Vitals:   10/07/22 1329  BP: (!) 86/54  Pulse: 82  Weight: 120 lb (54.4 kg)  Body mass index is 18.25 kg/m.        Physical Examination:   General appearance: Well appearing, and in no distress  Mental status: Alert, oriented to person, place, and time  Skin: Warm & dry  Cardiovascular: Normal heart rate noted  Respiratory: Normal respiratory effort, no distress  Abdomen: Soft, gravid, nontender  Pelvic: Cervical exam deferred         Extremities:    Fetal Status: Fetal Heart Rate (bpm): 158   Movement: Absent    No results found for this or any previous visit (from the past 24 hour(s)).  Assessment & Plan:  1) Low-risk pregnancy G2P1001 at [redacted]w[redacted]d with an Estimated Date of Delivery: 03/16/23   2) Ptyalism, hasn't picked up meds yet  3) Wants ppBTL, sign papers ~28wks  4) Previous ASB, urine POC today  5) +alpha thal carrier, discussed, unsure if FOB will get tested   Meds: No orders of the defined types were placed in this  encounter.  Labs/procedures today: 2nd IT; urine POC  Plan:  Continue routine obstetrical care   Reviewed: Preterm labor symptoms and general obstetric precautions including but not limited to vaginal bleeding, contractions, leaking of fluid and fetal movement were reviewed in detail with the patient.  All questions were answered. Didn't ask about home bp cuff. Check bp weekly, let us know if >140/90.   Follow-up: Return in about 3 weeks (around 10/28/2022) for LROB, [place]: Anatomy, in person.  Orders Placed This Encounter  Procedures   Urine Culture   US OB Comp + 14 Wk   INTEGRATED 2   Arabella Merles [facility] 10/07/2022 1:55 PM

## 2022-10-07 NOTE — Patient Instructions (Signed)
April Young, thank you for choosing our office today! We appreciate the opportunity to meet your healthcare needs. You may receive a short survey by mail, e-mail, or through MyChart. If you are happy with your care we would appreciate if you could take just a few minutes to complete the survey questions. We read all of your comments and take your feedback very seriously. Thank you again for choosing our office.  [facility] Team at [facility] ([address] , [place] [address]) Entrance C, located off of [address] Free 24/7 valet parking  Go to [email or web address] to register for FREE online childbirth classes  Call the office ([phone]) or go to [facility] if: You begin to severe cramping Your water breaks.  Sometimes it is a big gush of fluid, sometimes it is just a trickle that keeps getting your panties wet or running down your legs You have vaginal bleeding.  It is normal to have a small amount of spotting if your cervix was checked.   [place] Pediatricians/Family Doctors [facility] ([facility]): [address], [phone]           [facility]: [address], [phone]                [facility] ([facility]): [address], [phone] (call to ask if accepting patients) [facility]: [address], [place], [phone]    [place] Pediatricians/Family Doctors [facility] ([facility]): [address], [phone] [facility]: [address], [phone] [facility]: [address], [phone]  [place] [facility]  [facility] ([facility]): [phone] [facility]: [address], [phone]   [place] Family Doctors [facility]: [address], [phone]  [place] [facility]  [facility]: [address], [phone]  Home Blood Pressure Monitoring for Patients   Your provider has recommended that you check your blood pressure (BP) at least once a week at home. If you do not have a blood pressure cuff at home, one will be provided for you. Contact your provider if you have not received your monitor within 1 week.   Helpful Tips for Accurate Home Blood Pressure Checks  Don't smoke, exercise, or drink caffeine 30 minutes before checking your BP Use the restroom before checking your BP (a full bladder can raise your pressure) Relax in a comfortable upright chair Feet on the ground Left arm resting comfortably on a flat surface at the level of your heart Legs uncrossed Back supported Sit quietly and don't talk Place the cuff on your bare arm Adjust snuggly, so that only two fingertips can fit between your skin and the top of the cuff Check 2 readings separated by at least one minute Keep a log of your BP readings For a visual, please reference this diagram: http://ccnc.care/bpdiagram  Provider Name: [facility]     Phone: [phone]  Zone 1: ALL CLEAR  Continue to monitor your symptoms:  BP reading is less than 140 (top number) or less than 90 (bottom number)  No right upper stomach pain No headaches or seeing spots No feeling nauseated or throwing up No swelling in face and hands  Zone 2: CAUTION Call your doctor's office for any of the following:  BP reading is greater than 140 (top number) or greater than   90 (bottom number)  Stomach pain under your ribs in the middle or right side Headaches or seeing spots Feeling nauseated or throwing up Swelling in face and hands  Zone 3: EMERGENCY  Seek immediate medical care if you have any of the following:  BP reading is greater than160 (top number) or greater than 110 (bottom number) Severe headaches not improving with Tylenol Serious difficulty catching your breath Any worsening symptoms from  Zone 2     Second Trimester of Pregnancy The second trimester is from week 14 through week 27 (months 4 through 6). The second trimester is often a time when you feel your best. Your body has adjusted to being pregnant, and you begin to feel better physically. Usually, morning sickness has lessened or quit completely, you may have more energy, and you may have an increase in appetite. The second trimester is also a time when the fetus is growing rapidly. At the end of the sixth month, the fetus is about 9 inches long and weighs about 1 pounds. You will likely begin to feel the baby move (quickening) between 16 and 20 weeks of pregnancy. Body changes during your second trimester Your body continues to go through many changes during your second trimester. The changes vary from woman to woman. Your weight will continue to increase. You will notice your lower abdomen bulging out. You may begin to get stretch marks on your hips, abdomen, and breasts. You may develop headaches that can be relieved by medicines. The medicines should be approved by your health care provider. You may urinate more often because the fetus is pressing on your bladder. You may develop or continue to have heartburn as a result of your pregnancy. You may develop constipation because certain hormones are causing the muscles that push waste through your intestines to slow down. You may develop hemorrhoids or swollen, bulging veins (varicose veins). You may have back pain. This is caused by: Weight gain. Pregnancy hormones that are relaxing the joints in your pelvis. A shift in weight and the muscles that support your balance. Your breasts will continue to grow and they will continue to become tender. Your gums may bleed and may be sensitive to brushing and flossing. Dark spots or blotches (chloasma, mask of pregnancy) may develop on your face. This will likely fade after the baby is born. A dark line from your belly button to  the pubic area (linea nigra) may appear. This will likely fade after the baby is born. You may have changes in your hair. These can include thickening of your hair, rapid growth, and changes in texture. Some women also have hair loss during or after pregnancy, or hair that feels dry or thin. Your hair will most likely return to normal after your baby is born.  What to expect at prenatal visits During a routine prenatal visit: You will be weighed to make sure you and the fetus are growing normally. Your blood pressure will be taken. Your abdomen will be measured to track your baby's growth. The fetal heartbeat will be listened to. Any test results from the previous visit will be discussed.  Your health care provider may ask you: How you are feeling. If you are feeling the baby move. If you have had any abnormal symptoms, such as leaking fluid, bleeding, severe headaches, or abdominal cramping. If you are using any tobacco products, including cigarettes, chewing tobacco, and electronic cigarettes. If you have any questions.  Other tests that may be performed during   your second trimester include: Blood tests that check for: Low iron levels (anemia). High blood sugar that affects pregnant women (gestational diabetes) between 24 and 28 weeks. Rh antibodies. This is to check for a protein on red blood cells (Rh factor). Urine tests to check for infections, diabetes, or protein in the urine. An ultrasound to confirm the proper growth and development of the baby. An amniocentesis to check for possible genetic problems. Fetal screens for spina bifida and Down syndrome. HIV (human immunodeficiency virus) testing. Routine prenatal testing includes screening for HIV, unless you choose not to have this test.  Follow these instructions at home: Medicines Follow your health care provider's instructions regarding medicine use. Specific medicines may be either safe or unsafe to take during  pregnancy. Take a prenatal vitamin that contains at least 600 micrograms (mcg) of folic acid. If you develop constipation, try taking a stool softener if your health care provider approves. Eating and drinking Eat a balanced diet that includes fresh fruits and vegetables, whole grains, good sources of protein such as meat, eggs, or tofu, and low-fat dairy. Your health care provider will help you determine the amount of weight gain that is right for you. Avoid raw meat and uncooked cheese. These carry germs that can cause birth defects in the baby. If you have low calcium intake from food, talk to your health care provider about whether you should take a daily calcium supplement. Limit foods that are high in fat and processed sugars, such as fried and sweet foods. To prevent constipation: Drink enough fluid to keep your urine clear or pale yellow. Eat foods that are high in fiber, such as fresh fruits and vegetables, whole grains, and beans. Activity Exercise only as directed by your health care provider. Most women can continue their usual exercise routine during pregnancy. Try to exercise for 30 minutes at least 5 days a week. Stop exercising if you experience uterine contractions. Avoid heavy lifting, wear low heel shoes, and practice good posture. A sexual relationship may be continued unless your health care provider directs you otherwise. Relieving pain and discomfort Wear a good support bra to prevent discomfort from breast tenderness. Take warm sitz baths to soothe any pain or discomfort caused by hemorrhoids. Use hemorrhoid cream if your health care provider approves. Rest with your legs elevated if you have leg cramps or low back pain. If you develop varicose veins, wear support hose. Elevate your feet for 15 minutes, 3-4 times a day. Limit salt in your diet. Prenatal Care Write down your questions. Take them to your prenatal visits. Keep all your prenatal visits as told by your health  care provider. This is important. Safety Wear your seat belt at all times when driving. Make a list of emergency phone numbers, including numbers for family, friends, the hospital, and police and fire departments. General instructions Ask your health care provider for a referral to a local prenatal education class. Begin classes no later than the beginning of month 6 of your pregnancy. Ask for help if you have counseling or nutritional needs during pregnancy. Your health care provider can offer advice or refer you to specialists for help with various needs. Do not use hot tubs, steam rooms, or saunas. Do not douche or use tampons or scented sanitary pads. Do not cross your legs for long periods of time. Avoid cat litter boxes and soil used by cats. These carry germs that can cause birth defects in the baby and possibly loss of the   fetus by miscarriage or stillbirth. Avoid all smoking, herbs, alcohol, and unprescribed drugs. Chemicals in these products can affect the formation and growth of the baby. Do not use any products that contain nicotine or tobacco, such as cigarettes and e-cigarettes. If you need help quitting, ask your health care provider. Visit your dentist if you have not gone yet during your pregnancy. Use a soft toothbrush to brush your teeth and be gentle when you floss. Contact a health care provider if: You have dizziness. You have mild pelvic cramps, pelvic pressure, or nagging pain in the abdominal area. You have persistent nausea, vomiting, or diarrhea. You have a bad smelling vaginal discharge. You have pain when you urinate. Get help right away if: You have a fever. You are leaking fluid from your vagina. You have spotting or bleeding from your vagina. You have severe abdominal cramping or pain. You have rapid weight gain or weight loss. You have shortness of breath with chest pain. You notice sudden or extreme swelling of your face, hands, ankles, feet, or legs. You  have not felt your baby move in over an hour. You have severe headaches that do not go away when you take medicine. You have vision changes. Summary The second trimester is from week 14 through week 27 (months 4 through 6). It is also a time when the fetus is growing rapidly. Your body goes through many changes during pregnancy. The changes vary from woman to woman. Avoid all smoking, herbs, alcohol, and unprescribed drugs. These chemicals affect the formation and growth your baby. Do not use any tobacco products, such as cigarettes, chewing tobacco, and e-cigarettes. If you need help quitting, ask your health care provider. Contact your health care provider if you have any questions. Keep all prenatal visits as told by your health care provider. This is important. This information is not intended to replace advice given to you by your health care provider. Make sure you discuss any questions you have with your health care provider. Document Released: 09/22/2001 Document Revised: 03/05/2016 Document Reviewed: 11/29/2012 Elsevier Interactive Patient Education  2017 [employer]  

## 2022-10-12 ENCOUNTER — Encounter: Payer: Self-pay | Admitting: Advanced Practice Midwife

## 2022-10-12 ENCOUNTER — Other Ambulatory Visit: Payer: Self-pay | Admitting: Advanced Practice Midwife

## 2022-10-12 DIAGNOSIS — R8271 Bacteriuria: Secondary | ICD-10-CM

## 2022-10-12 DIAGNOSIS — N390 Urinary tract infection, site not specified: Secondary | ICD-10-CM

## 2022-10-12 DIAGNOSIS — O2342 Unspecified infection of urinary tract in pregnancy, second trimester: Secondary | ICD-10-CM

## 2022-10-12 LAB — URINE CULTURE

## 2022-10-12 MED ORDER — CIPROFLOXACIN HCL 250 MG PO TABS: 250.0000 mg | ORAL_TABLET | Freq: Two times a day (BID) | ORAL | 0 refills | Status: DC

## 2022-10-12 MED ORDER — NITROFURANTOIN MONOHYD MACRO 100 MG PO CAPS: 100.0000 mg | ORAL_CAPSULE | Freq: Every day | ORAL | 5 refills | Status: DC

## 2022-10-12 NOTE — L&D Delivery Note (Addendum)
Delivery Note April Young is a 27 y.o. G2P1001 at [redacted]w[redacted]d admitted for SOL.   GBS Status: --Theda Sers (05/08 1630) Maximum Maternal Temperature: 98.1  Labor course: Initial SVE: 4/60/-1 to 0. Augmentation with: AROM. She then progressed to complete.  ROM: 2h 35m with clear fluid  Birth: Pt w/ very poor pushing efforts to begin w/, then w/ last 3 contractions pushed much better and at 2307 a viable female was delivered via spontaneous vaginal delivery (Presentation: ROA). Nuchal cord present: No.  Shoulders and body delivered in usual fashion. Infant placed directly on mom's abdomen for bonding/skin-to-skin, baby dried and stimulated. Cord clamped x 2 after 1 minute and cut by pt's mom.  Cord blood collected.  The placenta separated spontaneously and delivered via gentle cord traction.  Pitocin infused rapidly IV per protocol.  Fundus firm with massage.  Placenta inspected and appears to be intact with a 3 VC.  Placenta/Cord with the following complications: none .  Cord pH: not done Sponge and instrument count were correct x2.  Intrapartum complications:  None Anesthesia:  epidural Episiotomy: none Lacerations:  none Suture Repair:  n/a EBL (mL): 28ml   Infant: APGAR (1 MIN):  see delivery summary APGAR (5 MINS):   APGAR (10 MINS):    Infant weight: pending  Delivery Report: Review the Delivery Report for details.    Mom to postpartum.  Baby to Couplet care / Skin to Skin. Placenta to L&D   Plans to Bottlefeed Contraception: salpingectomy, consent 12/23/22- Dr. Vergie Living posted case for 6/5 @ 0900, NPO after MN Circumcision: wants inpatient  Note sent to [facility]: FT for pp visit.  Cheral Marker CNM, [facility] 03/16/2023 11:22 PM

## 2022-10-16 LAB — INTEGRATED 2
AFP MoM: 1.04
Alpha-Fetoprotein: 50.3 ng/mL
Crown Rump Length: 72.6 mm
DIA MoM: 0.84
DIA Value: 145.7 pg/mL
Estriol, Unconjugated: 1.38 ng/mL
Gest. Age on Collection Date: 13.1 weeks
Gestational Age: 17.4 weeks
Maternal Age at EDD: 25.4 yr
Nuchal Translucency (NT): 1.7 mm
Nuchal Translucency MoM: 1.02
Number of Fetuses: 1
PAPP-A MoM: 1.6
PAPP-A Value: 3053.5 ng/mL
Test Results:: NEGATIVE
Weight: 107 [lb_av]
Weight: 120 [lb_av]
hCG MoM: 1.77
hCG Value: 56.8 IU/mL
uE3 MoM: 1.03

## 2022-10-16 LAB — CBC/D/PLT+RPR+RH+ABO+RUBIGG...
Basophils Absolute: 0 10*3/uL (ref 0.0–0.2)
Basos: 0 %
EOS (ABSOLUTE): 0.1 10*3/uL (ref 0.0–0.4)
Eos: 1 %
HCV Ab: NONREACTIVE
HIV Screen 4th Generation wRfx: NONREACTIVE
Hematocrit: 35.9 % (ref 34.0–46.6)
Hemoglobin: 11.7 g/dL (ref 11.1–15.9)
Hepatitis B Surface Ag: NEGATIVE
Immature Grans (Abs): 0.1 10*3/uL (ref 0.0–0.1)
Immature Granulocytes: 1 %
Lymphocytes Absolute: 1.8 10*3/uL (ref 0.7–3.1)
Lymphs: 19 %
MCH: 28.7 pg (ref 26.6–33.0)
MCHC: 32.6 g/dL (ref 31.5–35.7)
MCV: 88 fL (ref 79–97)
Monocytes Absolute: 0.5 10*3/uL (ref 0.1–0.9)
Monocytes: 6 %
Neutrophils Absolute: 7.1 10*3/uL — ABNORMAL HIGH (ref 1.4–7.0)
Neutrophils: 73 %
Platelets: 186 10*3/uL (ref 150–450)
RBC: 4.07 x10E6/uL (ref 3.77–5.28)
RDW: 12.9 % (ref 11.7–15.4)
RPR Ser Ql: NONREACTIVE
Rh Factor: NEGATIVE
Rubella Antibodies, IGG: 22.1 index (ref >0.99)
WBC: 9.6 10*3/uL (ref 3.4–10.8)

## 2022-10-16 LAB — HCV INTERPRETATION

## 2022-10-16 LAB — INTEGRATED 1
Crown Rump Length: 72.6 mm
Nuchal Translucency (NT): 1.7 mm
Number of Fetuses: 1
PAPP-A Value: 3053.5 ng/mL
Weight: 107 [lb_av]

## 2022-10-16 LAB — AB SCR+ANTIBODY ID: Antibody Screen: POSITIVE — AB

## 2022-10-19 DIAGNOSIS — Z3480 Encounter for supervision of other normal pregnancy, unspecified trimester: Secondary | ICD-10-CM | POA: Diagnosis not present

## 2022-10-28 ENCOUNTER — Ambulatory Visit (INDEPENDENT_AMBULATORY_CARE_PROVIDER_SITE_OTHER): Payer: Medicaid Other | Admitting: Advanced Practice Midwife

## 2022-10-28 ENCOUNTER — Ambulatory Visit (INDEPENDENT_AMBULATORY_CARE_PROVIDER_SITE_OTHER): Payer: Medicaid Other

## 2022-10-28 VITALS — BP 112/68 | HR 90 | Wt 126.8 lb

## 2022-10-28 DIAGNOSIS — R8271 Bacteriuria: Secondary | ICD-10-CM

## 2022-10-28 DIAGNOSIS — Z3480 Encounter for supervision of other normal pregnancy, unspecified trimester: Secondary | ICD-10-CM

## 2022-10-28 DIAGNOSIS — O26899 Other specified pregnancy related conditions, unspecified trimester: Secondary | ICD-10-CM

## 2022-10-28 DIAGNOSIS — Z363 Encounter for antenatal screening for malformations: Secondary | ICD-10-CM

## 2022-10-28 DIAGNOSIS — Z3A20 20 weeks gestation of pregnancy: Secondary | ICD-10-CM | POA: Diagnosis not present

## 2022-10-28 DIAGNOSIS — Z302 Encounter for sterilization: Secondary | ICD-10-CM

## 2022-10-28 NOTE — Patient Instructions (Signed)
April Young, thank you for choosing our office today! We appreciate the opportunity to meet your healthcare needs. You may receive a short survey by mail, e-mail, or through [employer]. If you are happy with your care we would appreciate if you could take just a few minutes to complete the survey questions. We read all of your comments and take your feedback very seriously. Thank you again for choosing our office.  Center for [employer] Team at [place] at [facility] ([place],  [address]) Entrance C, located off of [place] parking  Go to [email or web address] to register for FREE online childbirth classes  Call the office [phone]) or go to [facility] if: You begin to severe cramping Your water breaks.  Sometimes it is a big gush of fluid, sometimes it is just a trickle that keeps getting your panties wet or running down your legs You have vaginal bleeding.  It is normal to have a small amount of spotting if your cervix was checked.   [facility] Pediatricians/Family Doctors [facility]): [address] [place], [facility]: [address] [place], [phone]                [place] [facility]): [place], [phone] (call to ask if accepting patients) [facility] Department: [address], [place], [place] Pediatricians/Family Doctors [facility]): [place]. [place], [address], [facility]: [address] [place], [place] [facility]: [place], [place]): [phone] [facility]: [address] [place]: [address] [facility]: [phone], [phone]  Home Blood Pressure Monitoring for Patients   Your provider has recommended that you check your blood pressure (BP) at least once a week at home. If you do not have a blood pressure cuff at home, one will be provided for you. Contact your provider if you have not received your monitor within 1 week.   Helpful Tips for Accurate Home Blood Pressure Checks  Don't smoke, exercise, or drink caffeine 30 minutes before checking your BP Use the restroom before checking your BP (a full bladder can raise your pressure) Relax in a comfortable upright chair Feet on the ground Left arm resting comfortably on a flat surface at the level of your heart Legs uncrossed Back supported Sit quietly and don't talk Place the cuff on your bare arm Adjust snuggly, so that only two fingertips can fit between your skin and the top of the cuff Check 2 readings separated by at least one minute Keep a log of your BP readings For a visual, please reference this diagram: http://ccnc.care/bpdiagram  Provider Name: [facility]     Phone: [phone]  Zone 1: ALL CLEAR  Continue to monitor your symptoms:  BP reading is less than 140 (top number) or less than 90 (bottom number)  No right upper stomach pain No headaches or seeing spots No feeling nauseated or throwing up No swelling in face and hands  Zone 2: CAUTION Call your doctor's office for any of the following:  BP reading is greater than 140 (top number) or greater than  90 (bottom number)  Stomach pain under your ribs in the middle or right side Headaches or seeing spots Feeling nauseated or throwing up Swelling in face and hands  Zone 3: EMERGENCY  Seek immediate medical care if you have any of the following:  BP reading is greater than160 (top number) or greater than 110 (bottom number) Severe headaches not improving with Tylenol Serious difficulty catching your breath Any worsening symptoms from  Zone 2     Second Trimester of Pregnancy The second trimester is from week 14 through week 27 (months 4 through 6). The second trimester is often a time when you feel your best. Your body has adjusted to being pregnant, and you begin to feel better physically. Usually, morning sickness has lessened or quit completely, you may have more energy, and you may have an increase in appetite. The second trimester is also a time when the fetus is growing rapidly. At the end of the sixth month, the fetus is about 9 inches long and weighs about 1 pounds. You will likely begin to feel the baby move (quickening) between 16 and 20 weeks of pregnancy. Body changes during your second trimester Your body continues to go through many changes during your second trimester. The changes vary from woman to woman. Your weight will continue to increase. You will notice your lower abdomen bulging out. You may begin to get stretch marks on your hips, abdomen, and breasts. You may develop headaches that can be relieved by medicines. The medicines should be approved by your health care provider. You may urinate more often because the fetus is pressing on your bladder. You may develop or continue to have heartburn as a result of your pregnancy. You may develop constipation because certain hormones are causing the muscles that push waste through your intestines to slow down. You may develop hemorrhoids or swollen, bulging veins (varicose veins). You may have back pain. This is caused by: Weight gain. Pregnancy hormones that are relaxing the joints in your pelvis. A shift in weight and the muscles that support your balance. Your breasts will continue to grow and they will continue to become tender. Your gums may bleed and may be sensitive to brushing and flossing. Dark spots or blotches (chloasma, mask of pregnancy) may develop on your face. This will likely fade after the baby is born. A dark line from your belly button to  the pubic area (linea nigra) may appear. This will likely fade after the baby is born. You may have changes in your hair. These can include thickening of your hair, rapid growth, and changes in texture. Some women also have hair loss during or after pregnancy, or hair that feels dry or thin. Your hair will most likely return to normal after your baby is born.  What to expect at prenatal visits During a routine prenatal visit: You will be weighed to make sure you and the fetus are growing normally. Your blood pressure will be taken. Your abdomen will be measured to track your baby's growth. The fetal heartbeat will be listened to. Any test results from the previous visit will be discussed.  Your health care provider may ask you: How you are feeling. If you are feeling the baby move. If you have had any abnormal symptoms, such as leaking fluid, bleeding, severe headaches, or abdominal cramping. If you are using any tobacco products, including cigarettes, chewing tobacco, and electronic cigarettes. If you have any questions.  Other tests that may be performed during  your second trimester include: Blood tests that check for: Low iron levels (anemia). High blood sugar that affects pregnant women (gestational diabetes) between 41 and 28 weeks. Rh antibodies. This is to check for a protein on red blood cells (Rh factor). Urine tests to check for infections, diabetes, or protein in the urine. An ultrasound to confirm the proper growth and development of the baby. An amniocentesis to check for possible genetic problems. Fetal screens for spina bifida and Down syndrome. HIV (human immunodeficiency virus) testing. Routine prenatal testing includes screening for HIV, unless you choose not to have this test.  Follow these instructions at home: Medicines Follow your health care provider's instructions regarding medicine use. Specific medicines may be either safe or unsafe to take during  pregnancy. Take a prenatal vitamin that contains at least 600 micrograms (mcg) of folic acid. If you develop constipation, try taking a stool softener if your health care provider approves. Eating and drinking Eat a balanced diet that includes fresh fruits and vegetables, whole grains, good sources of protein such as meat, eggs, or tofu, and low-fat dairy. Your health care provider will help you determine the amount of weight gain that is right for you. Avoid raw meat and uncooked cheese. These carry germs that can cause birth defects in the baby. If you have low calcium intake from food, talk to your health care provider about whether you should take a daily calcium supplement. Limit foods that are high in fat and processed sugars, such as fried and sweet foods. To prevent constipation: Drink enough fluid to keep your urine clear or pale yellow. Eat foods that are high in fiber, such as fresh fruits and vegetables, whole grains, and beans. Activity Exercise only as directed by your health care provider. Most women can continue their usual exercise routine during pregnancy. Try to exercise for 30 minutes at least 5 days a week. Stop exercising if you experience uterine contractions. Avoid heavy lifting, wear low heel shoes, and practice good posture. A sexual relationship may be continued unless your health care provider directs you otherwise. Relieving pain and discomfort Wear a good support bra to prevent discomfort from breast tenderness. Take warm sitz baths to soothe any pain or discomfort caused by hemorrhoids. Use hemorrhoid cream if your health care provider approves. Rest with your legs elevated if you have leg cramps or low back pain. If you develop varicose veins, wear support hose. Elevate your feet for 15 minutes, 3-4 times a day. Limit salt in your diet. Prenatal Care Write down your questions. Take them to your prenatal visits. Keep all your prenatal visits as told by your health  care provider. This is important. Safety Wear your seat belt at all times when driving. Make a list of emergency phone numbers, including numbers for family, friends, the hospital, and police and fire departments. General instructions Ask your health care provider for a referral to a local prenatal education class. Begin classes no later than the beginning of month 6 of your pregnancy. Ask for help if you have counseling or nutritional needs during pregnancy. Your health care provider can offer advice or refer you to specialists for help with various needs. Do not use hot tubs, steam rooms, or saunas. Do not douche or use tampons or scented sanitary pads. Do not cross your legs for long periods of time. Avoid cat litter boxes and soil used by cats. These carry germs that can cause birth defects in the baby and possibly loss of the  fetus by miscarriage or stillbirth. Avoid all smoking, herbs, alcohol, and unprescribed drugs. Chemicals in these products can affect the formation and growth of the baby. Do not use any products that contain nicotine or tobacco, such as cigarettes and e-cigarettes. If you need help quitting, ask your health care provider. Visit your dentist if you have not gone yet during your pregnancy. Use a soft toothbrush to brush your teeth and be gentle when you floss. Contact a health care provider if: You have dizziness. You have mild pelvic cramps, pelvic pressure, or nagging pain in the abdominal area. You have persistent nausea, vomiting, or diarrhea. You have a bad smelling vaginal discharge. You have pain when you urinate. Get help right away if: You have a fever. You are leaking fluid from your vagina. You have spotting or bleeding from your vagina. You have severe abdominal cramping or pain. You have rapid weight gain or weight loss. You have shortness of breath with chest pain. You notice sudden or extreme swelling of your face, hands, ankles, feet, or legs. You  have not felt your baby move in over an hour. You have severe headaches that do not go away when you take medicine. You have vision changes. Summary The second trimester is from week 14 through week 27 (months 4 through 6). It is also a time when the fetus is growing rapidly. Your body goes through many changes during pregnancy. The changes vary from woman to woman. Avoid all smoking, herbs, alcohol, and unprescribed drugs. These chemicals affect the formation and growth your baby. Do not use any tobacco products, such as cigarettes, chewing tobacco, and e-cigarettes. If you need help quitting, ask your health care provider. Contact your health care provider if you have any questions. Keep all prenatal visits as told by your health care provider. This is important. This information is not intended to replace advice given to you by your health care provider. Make sure you discuss any questions you have with your health care provider. Document Released: 09/22/2001 Document Revised: 03/05/2016 Document Reviewed: 11/29/2012 Elsevier Interactive Patient Education  2017 [employer].

## 2022-10-28 NOTE — Progress Notes (Addendum)
   LOW-RISK PREGNANCY VISIT Patient name: April Young MRN [id number]  Date of birth: Apr 21, 1996 Chief Complaint:   Routine Prenatal Visit  History of Present Illness:   April Young is a 27 y.o. G41P1001 female at [redacted]w[redacted]d with an Estimated Date of Delivery: 03/16/23 being seen today for ongoing management of a low-risk pregnancy.  Today she reports  feeling better; finish Cipro from latest UTI but hasn't started Macrobid suppression . Contractions: Not present. Vag. Bleeding: None.  Movement: Present. denies leaking of fluid. Review of Systems:   Pertinent items are noted in HPI Denies abnormal vaginal discharge w/ itching/odor/irritation, headaches, visual changes, shortness of breath, chest pain, abdominal pain, severe nausea/vomiting, or problems with urination or bowel movements unless otherwise stated above. Pertinent History Reviewed:  Reviewed past medical,surgical, social, obstetrical and family history.  Reviewed problem list, medications and allergies. Physical Assessment:   Vitals:   10/28/22 1628  BP: 112/68  Pulse: 90  Weight: 126 lb 12.8 oz (57.5 kg)  Body mass index is 19.28 kg/m.        Physical Examination:   General appearance: Well appearing, and in no distress  Mental status: Alert, oriented to person, place, and time  Skin: Warm & dry  Cardiovascular: Normal heart rate noted  Respiratory: Normal respiratory effort, no distress  Abdomen: Soft, gravid, nontender  Pelvic: Cervical exam deferred         Extremities:    Fetal Status: Fetal Heart Rate (bpm): 146 u/s   Movement: Present    Anatomy u/s:   [place] 20+1 wks,breech,anterior placenta gr 0,SVP of fluid 6.2 cm,LVEICF,normal right ovary,left adnexal cyst 7.6 x 5.7 x 6.9 cm,FHR 146 bpm,mild bilateral renal pelvic dilation, RK 4.1 mm,LK 4.1 mm,CX 2.8 cm,EFW 465 g 99.7%    No results found for this or any previous visit (from the past 24 hour(s)).  Assessment & Plan:  1) Low-risk pregnancy G2P1001 at  [redacted]w[redacted]d with an Estimated Date of Delivery: 03/16/23   2) L ovarian cyst, stable from 12wk scan  3) Bilat renal pelvic dilation, 42mm ___   4) Hx gest thrombocytopenia  5) ASB x 3 with suppression, finished 3rd round of abx but hasn't started Macrobid qhs suppression, POC today  Meds: No orders of the defined types were placed in this encounter.  Labs/procedures today: anatomy u/s  Plan:  Continue routine obstetrical care   Reviewed: Preterm labor symptoms and general obstetric precautions including but not limited to vaginal bleeding, contractions, leaking of fluid and fetal movement were reviewed in detail with the patient.  All questions were answered. Didn't ask about home bp cuff. Check bp weekly, let us know if >140/90.   Follow-up: Return in about 4 weeks (around 11/25/2022) for Petronila, in person.  Orders Placed This Encounter  Procedures   Urine Culture   [facility] 10/28/2022 5:06 PM

## 2022-10-28 NOTE — Progress Notes (Addendum)
[place] 20+1 wks,breech,anterior placenta gr 0,SVP of fluid 6.2 cm,LVEICF,normal right ovary,left adnexal cyst 7.6 x 5.7 x 6.9 cm,FHR 146 bpm,mild bilateral renal pelvic dilatation, RK 4.1 mm,LK 4.1 mm,CX 2.8 cm,EFW 465 g 99.7%

## 2022-10-29 ENCOUNTER — Encounter: Payer: Medicaid Other | Admitting: Advanced Practice Midwife

## 2022-10-29 ENCOUNTER — Other Ambulatory Visit: Payer: Medicaid Other

## 2022-10-29 DIAGNOSIS — Z3A20 20 weeks gestation of pregnancy: Secondary | ICD-10-CM | POA: Diagnosis not present

## 2022-10-29 DIAGNOSIS — R8271 Bacteriuria: Secondary | ICD-10-CM | POA: Diagnosis not present

## 2022-10-31 LAB — URINE CULTURE: Organism ID, Bacteria: NO GROWTH

## 2022-11-25 ENCOUNTER — Encounter: Payer: Medicaid Other | Admitting: Advanced Practice Midwife

## 2022-12-01 ENCOUNTER — Encounter: Payer: Medicaid Other | Admitting: Women's Health

## 2022-12-08 ENCOUNTER — Encounter: Payer: Self-pay | Admitting: Women's Health

## 2022-12-08 ENCOUNTER — Ambulatory Visit (INDEPENDENT_AMBULATORY_CARE_PROVIDER_SITE_OTHER): Payer: Medicaid Other | Admitting: Women's Health

## 2022-12-08 VITALS — BP 104/63 | HR 88 | Wt 134.0 lb

## 2022-12-08 DIAGNOSIS — N390 Urinary tract infection, site not specified: Secondary | ICD-10-CM

## 2022-12-08 DIAGNOSIS — Z3482 Encounter for supervision of other normal pregnancy, second trimester: Secondary | ICD-10-CM

## 2022-12-08 DIAGNOSIS — R898 Other abnormal findings in specimens from other organs, systems and tissues: Secondary | ICD-10-CM

## 2022-12-08 DIAGNOSIS — Z3480 Encounter for supervision of other normal pregnancy, unspecified trimester: Secondary | ICD-10-CM

## 2022-12-08 DIAGNOSIS — Z3A26 26 weeks gestation of pregnancy: Secondary | ICD-10-CM

## 2022-12-08 MED ORDER — FAMOTIDINE 20 MG PO TABS: 20.0000 mg | ORAL_TABLET | Freq: Two times a day (BID) | ORAL | 3 refills | Status: DC

## 2022-12-08 MED ORDER — GLYCOPYRROLATE 1 MG PO TABS: 1.0000 mg | ORAL_TABLET | Freq: Three times a day (TID) | ORAL | 6 refills | Status: DC

## 2022-12-08 MED ORDER — NITROFURANTOIN MONOHYD MACRO 100 MG PO CAPS: 100.0000 mg | ORAL_CAPSULE | Freq: Every day | ORAL | 5 refills | Status: DC

## 2022-12-08 NOTE — Patient Instructions (Signed)
April Young, thank you for choosing our office today! We appreciate the opportunity to meet your healthcare needs. You may receive a short survey by mail, e-mail, or through [employer]. If you are happy with your care we would appreciate if you could take just a few minutes to complete the survey questions. We read all of your comments and take your feedback very seriously. Thank you again for choosing our office.  Center for [employer] Team at [place] at [facility] ([place],  [id number]) Entrance C, located off of [address] parking   You will have your sugar test next visit.  Please do not eat or drink anything after midnight the night before you come, not even water.  You will be here for at least two hours.  Please make an appointment online for the bloodwork at [email or web address] for 8:00am (or as close to this as possible). Make sure you select the [facility] service center.   CLASSES: Go to [email or web address] to register for classes (childbirth, breastfeeding, waterbirth, infant CPR, daddy bootcamp, etc.)  Call the office [phone]) or go to [facility] if: You begin to have strong, frequent contractions Your water breaks.  Sometimes it is a big gush of fluid, sometimes it is just a trickle that keeps getting your panties wet or running down your legs You have vaginal bleeding.  It is normal to have a small amount of spotting if your cervix was checked.  You don't feel your baby moving like normal.  If you don't, get you something to eat and drink and lay down and focus on feeling your baby move.   If your baby is still not moving like normal, you should call the office or go to [facility].  Call the office [phone]) or go to [facility] hospital for these signs of pre-eclampsia: Severe headache that does not go away with Tylenol Visual changes- seeing spots, double, blurred vision Pain under your right breast or upper  abdomen that does not go away with Tums or heartburn medicine Nausea and/or vomiting Severe swelling in your hands, feet, and face    [facility] Pediatricians/Family Doctors [facility]): [address] [place], [place]: [address] [place], [facility]): [place], [phone] (call to ask if accepting patients) [facility] [facility]: [address] [place], [facility]): [address], [facility]: [address] [place], [place] [facility]: [place], [facility]): [phone] [facility]: [address] [place]: [address], [place] [facility]: [phone], [phone]  Home Blood Pressure Monitoring for Patients   Your provider has recommended that you check your blood pressure (BP) at least once a week at home. If you do not have a blood pressure cuff at home, one will be provided for you. Contact your provider if you have not received your monitor within 1 week.   Helpful Tips for Accurate Home Blood Pressure Checks  Don't smoke, exercise, or drink  caffeine 30 minutes before checking your BP Use the restroom before checking your BP (a full bladder can raise your pressure) Relax in a comfortable upright chair Feet on the ground Left arm resting comfortably on a flat surface at the level of your heart Legs uncrossed Back supported Sit quietly and don't talk Place the cuff on your bare arm Adjust snuggly, so that only two fingertips can fit between your skin and the top of the cuff Check 2  readings separated by at least one minute Keep a log of your BP readings For a visual, please reference this diagram: http://ccnc.care/bpdiagram  Provider Name: [facility]     Phone: [phone]  Zone 1: ALL CLEAR  Continue to monitor your symptoms:  BP reading is less than 140 (top number) or less than 90 (bottom number)  No right upper stomach pain No headaches or seeing spots No feeling nauseated or throwing up No swelling in face and hands  Zone 2: CAUTION Call your doctor's office for any of the following:  BP reading is greater than 140 (top number) or greater than 90 (bottom number)  Stomach pain under your ribs in the middle or right side Headaches or seeing spots Feeling nauseated or throwing up Swelling in face and hands  Zone 3: EMERGENCY  Seek immediate medical care if you have any of the following:  BP reading is greater than160 (top number) or greater than 110 (bottom number) Severe headaches not improving with Tylenol Serious difficulty catching your breath Any worsening symptoms from Zone 2   Second Trimester of Pregnancy The second trimester is from week 13 through week 28, months 4 through 6. The second trimester is often a time when you feel your best. Your body has also adjusted to being pregnant, and you begin to feel better physically. Usually, morning sickness has lessened or quit completely, you may have more energy, and you may have an increase in appetite. The second trimester is also a time when the fetus is growing rapidly. At the end of the sixth month, the fetus is about 9 inches long and weighs about 1 pounds. You will likely begin to feel the baby move (quickening) between 18 and 20 weeks of the pregnancy. BODY CHANGES Your body goes through many changes during pregnancy. The changes vary from woman to woman.  Your weight will continue to increase. You will notice your lower abdomen bulging out. You may begin to get stretch marks on your  hips, abdomen, and breasts. You may develop headaches that can be relieved by medicines approved by your health care provider. You may urinate more often because the fetus is pressing on your bladder. You may develop or continue to have heartburn as a result of your pregnancy. You may develop constipation because certain hormones are causing the muscles that push waste through your intestines to slow down. You may develop hemorrhoids or swollen, bulging veins (varicose veins). You may have back pain because of the weight gain and pregnancy hormones relaxing your joints between the bones in your pelvis and as a result of a shift in weight and the muscles that support your balance. Your breasts will continue to grow and be tender. Your gums may bleed and may be sensitive to brushing and flossing. Dark spots or blotches (chloasma, mask of pregnancy) may develop on your face. This will likely fade after the baby is born. A dark line from your belly button to the pubic area (linea nigra) may appear. This  will likely fade after the baby is born. You may have changes in your hair. These can include thickening of your hair, rapid growth, and changes in texture. Some women also have hair loss during or after pregnancy, or hair that feels dry or thin. Your hair will most likely return to normal after your baby is born. WHAT TO EXPECT AT YOUR PRENATAL VISITS During a routine prenatal visit: You will be weighed to make sure you and the fetus are growing normally. Your blood pressure will be taken. Your abdomen will be measured to track your baby's growth. The fetal heartbeat will be listened to. Any test results from the previous visit will be discussed. Your health care provider may ask you: How you are feeling. If you are feeling the baby move. If you have had any abnormal symptoms, such as leaking fluid, bleeding, severe headaches, or abdominal cramping. If you have any questions. Other tests that may  be performed during your second trimester include: Blood tests that check for: Low iron levels (anemia). Gestational diabetes (between 24 and 28 weeks). Rh antibodies. Urine tests to check for infections, diabetes, or protein in the urine. An ultrasound to confirm the proper growth and development of the baby. An amniocentesis to check for possible genetic problems. Fetal screens for spina bifida and Down syndrome. HOME CARE INSTRUCTIONS  Avoid all smoking, herbs, alcohol, and unprescribed drugs. These chemicals affect the formation and growth of the baby. Follow your health care provider's instructions regarding medicine use. There are medicines that are either safe or unsafe to take during pregnancy. Exercise only as directed by your health care provider. Experiencing uterine cramps is a good sign to stop exercising. Continue to eat regular, healthy meals. Wear a good support bra for breast tenderness. Do not use hot tubs, steam rooms, or saunas. Wear your seat belt at all times when driving. Avoid raw meat, uncooked cheese, cat litter boxes, and soil used by cats. These carry germs that can cause birth defects in the baby. Take your prenatal vitamins. Try taking a stool softener (if your health care provider approves) if you develop constipation. Eat more high-fiber foods, such as fresh vegetables or fruit and whole grains. Drink plenty of fluids to keep your urine clear or pale yellow. Take warm sitz baths to soothe any pain or discomfort caused by hemorrhoids. Use hemorrhoid cream if your health care provider approves. If you develop varicose veins, wear support hose. Elevate your feet for 15 minutes, 3-4 times a day. Limit salt in your diet. Avoid heavy lifting, wear low heel shoes, and practice good posture. Rest with your legs elevated if you have leg cramps or low back pain. Visit your dentist if you have not gone yet during your pregnancy. Use a soft toothbrush to brush your teeth  and be gentle when you floss. A sexual relationship may be continued unless your health care provider directs you otherwise. Continue to go to all your prenatal visits as directed by your health care provider. SEEK MEDICAL CARE IF:  You have dizziness. You have mild pelvic cramps, pelvic pressure, or nagging pain in the abdominal area. You have persistent nausea, vomiting, or diarrhea. You have a bad smelling vaginal discharge. You have pain with urination. SEEK IMMEDIATE MEDICAL CARE IF:  You have a fever. You are leaking fluid from your vagina. You have spotting or bleeding from your vagina. You have severe abdominal cramping or pain. You have rapid weight gain or loss. You have shortness of  breath with chest pain. You notice sudden or extreme swelling of your face, hands, ankles, feet, or legs. You have not felt your baby move in over an hour. You have severe headaches that do not go away with medicine. You have vision changes. Document Released: 09/22/2001 Document Revised: 10/03/2013 Document Reviewed: 11/29/2012 [facility] Patient Information 2015 [place], [place]. This information is not intended to replace advice given to you by your health care provider. Make sure you discuss any questions you have with your health care provider.

## 2022-12-08 NOTE — Progress Notes (Signed)
LOW-RISK PREGNANCY VISIT Patient name: IKEA POSTEN MRN [id number]  Date of birth: 02-19-1996 Chief Complaint:   Routine Prenatal Visit  History of Present Illness:   April Young is a 27 y.o. G5P1001 female at 19w0dwith an Estimated Date of Delivery: 03/16/23 being seen today for ongoing management of a low-risk pregnancy.   Today she reports  spits all the time, has not started robinul, states pharmacy said they didn't have rx. Not taking qhs macrobid, thought she only needed to take the BID x 7d tx dose. No current sx. Has had bad reflux, friend gave her some famotidine that helped, but almost out. . Contractions: Not present.  .  Movement: Present. denies leaking of fluid.     09/07/2022    2:05 PM 01/28/2022   11:06 AM 01/19/2017    2:01 PM  Depression screen PHQ 2/9  Decreased Interest '3 2 1  '$ Down, Depressed, Hopeless 2 1 0  PHQ - 2 Score '5 3 1  '$ Altered sleeping '3 1 1  '$ Tired, decreased energy '3 1 1  '$ Change in appetite '2 3 1  '$ Feeling bad or failure about yourself  0 1 0  Trouble concentrating 0 1 0  Moving slowly or fidgety/restless 0 0 0  Suicidal thoughts 0 0 0  PHQ-9 Score '13 10 4        '$ 09/07/2022    2:06 PM 01/28/2022   11:07 AM  GAD 7 : Generalized Anxiety Score  Nervous, Anxious, on Edge 1 1  Control/stop worrying 3 3  Worry too much - different things 3 3  Trouble relaxing 2 1  Restless 0 0  Easily annoyed or irritable 3 1  Afraid - awful might happen 0 1  Total GAD 7 Score 12 10      Review of Systems:   Pertinent items are noted in HPI Denies abnormal vaginal discharge w/ itching/odor/irritation, headaches, visual changes, shortness of breath, chest pain, abdominal pain, severe nausea/vomiting, or problems with urination or bowel movements unless otherwise stated above. Pertinent History Reviewed:  Reviewed past medical,surgical, social, obstetrical and family history.  Reviewed problem list, medications and allergies. Physical Assessment:    Vitals:   12/08/22 1458  BP: 104/63  Pulse: 88  Weight: 134 lb (60.8 kg)  Body mass index is 20.37 kg/m.        Physical Examination:   General appearance: Well appearing, and in no distress  Mental status: Alert, oriented to person, place, and time  Skin: Warm & dry  Cardiovascular: Normal heart rate noted  Respiratory: Normal respiratory effort, no distress  Abdomen: Soft, gravid, nontender  Pelvic: Cervical exam deferred         Extremities: Edema: None  Fetal Status: Fetal Heart Rate (bpm): 142 Fundal Height: 26 cm Movement: Present    Chaperone: N/A   No results found for this or any previous visit (from the past 24 hour(s)).  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    Dispense:  90 tablet    Refill:  6   nitrofurantoin, macrocrystal-monohydrate, (MACROBID) 100 MG capsule    Sig: Take 1 capsule (100 mg total) by mouth at bedtime.    Dispense:  30 capsule    Refill:  5   famotidine (PEPCID) 20 MG tablet    Sig: Take 1 tablet (20 mg total) by mouth 2 (two) times daily.    Dispense:  30 tablet    Refill:  3   Labs/procedures today: declined flu shot  Plan:  Continue routine obstetrical care  Next visit: prefers will be in person for pn2, concerned about vomiting w/ sweet stuff (to take zofran w/ small sip of water 1-2hrs before appt)     Reviewed: Preterm labor symptoms and general obstetric precautions including but not  limited to vaginal bleeding, contractions, leaking of fluid and fetal movement were reviewed in detail with the patient.  All questions were answered. Does have home bp cuff. Office bp cuff given: not applicable. Check bp weekly, let us know if consistently >140 and/or >90.  Follow-up: Return in about 2 weeks (around 12/22/2022) for LROB, PN2, CNM, in person.  Future Appointments  Date Time Provider [place]  12/23/2022  8:30 AM [facility]  12/23/2022  9:50 AM Myrtis Ser, CNM [facility]    No orders of the defined types were placed in this encounter.  [place], [facility] 12/08/2022 3:28 PM

## 2022-12-23 ENCOUNTER — Other Ambulatory Visit: Payer: Medicaid Other

## 2022-12-23 ENCOUNTER — Ambulatory Visit (INDEPENDENT_AMBULATORY_CARE_PROVIDER_SITE_OTHER): Payer: Medicaid Other | Admitting: Advanced Practice Midwife

## 2022-12-23 VITALS — BP 119/74 | HR 96 | Wt 135.0 lb

## 2022-12-23 DIAGNOSIS — Z3A28 28 weeks gestation of pregnancy: Secondary | ICD-10-CM | POA: Diagnosis not present

## 2022-12-23 DIAGNOSIS — Z131 Encounter for screening for diabetes mellitus: Secondary | ICD-10-CM

## 2022-12-23 DIAGNOSIS — Z3480 Encounter for supervision of other normal pregnancy, unspecified trimester: Secondary | ICD-10-CM

## 2022-12-23 DIAGNOSIS — Z3483 Encounter for supervision of other normal pregnancy, third trimester: Secondary | ICD-10-CM

## 2022-12-23 DIAGNOSIS — Z302 Encounter for sterilization: Secondary | ICD-10-CM

## 2022-12-23 NOTE — Patient Instructions (Signed)
Baya, thank you for choosing our office today! We appreciate the opportunity to meet your healthcare needs. You may receive a short survey by mail, e-mail, or through [employer]. If you are happy with your care we would appreciate if you could take just a few minutes to complete the survey questions. We read all of your comments and take your feedback very seriously. Thank you again for choosing our office.  Center for [employer] Team at [place] at [facility] ([place], [place] [address]) Entrance C, located off of [address] parking   CLASSES: Go to [email or web address] to register for classes (childbirth, breastfeeding, waterbirth, infant CPR, daddy bootcamp, etc.)  Call the office [phone]) or go to [facility] if: You begin to have strong, frequent contractions Your water breaks.  Sometimes it is a big gush of fluid, sometimes it is just a trickle that keeps getting your panties wet or running down your legs You have vaginal bleeding.  It is normal to have a small amount of spotting if your cervix was checked.  You don't feel your baby moving like normal.  If you don't, get you something to eat and drink and lay down and focus on feeling your baby move.   If your baby is still not moving like normal, you should call the office or go to [facility].  Call the office [phone]) or go to [facility] hospital for these signs of pre-eclampsia: Severe headache that does not go away with Tylenol Visual changes- seeing spots, double, blurred vision Pain under your right breast or upper abdomen that does not go away with Tums or heartburn medicine Nausea and/or vomiting Severe swelling in your hands, feet, and face   Tdap Vaccine It is recommended that you get the Tdap vaccine during the third trimester of EACH pregnancy to help protect your baby from getting pertussis (whooping cough) 27-36 weeks is the BEST time to do  this so that you can pass the protection on to your baby. During pregnancy is better than after pregnancy, but if you are unable to get it during pregnancy it will be offered at the hospital.  You can get this vaccine with [place], at the health department, your family doctor, or some local pharmacies Everyone who will be around your baby should also be up-to-date on their vaccines before the baby comes. Adults (who are not pregnant) only need 1 dose of Tdap during adulthood.   [facility] Pediatricians/Family Doctors [facility]): [address] [place], [facility]: [address] [place], [phone]                [facility]): [place], [phone] (call to ask if accepting patients) [facility] Department: [address], [place], [place] Pediatricians/Family Doctors [facility]): [place] [place], [address], [place]: [address] [place], [place] [facility]: [place], [place]): [phone] [facility]: [address], [place]: [address], [place] [facility]: [phone], [phone]  Home Blood Pressure Monitoring for Patients   Your provider has recommended that you check your  blood pressure (BP) at least once a week at home. If you do not have a blood pressure cuff at home, one will be provided for you. Contact your provider if you have not received your monitor within 1 week.   Helpful Tips for Accurate Home Blood Pressure Checks  Don't smoke, exercise, or drink caffeine 30 minutes before checking your BP Use the restroom before checking your BP (a full bladder can raise your  pressure) Relax in a comfortable upright chair Feet on the ground Left arm resting comfortably on a flat surface at the level of your heart Legs uncrossed Back supported Sit quietly and don't talk Place the cuff on your bare arm Adjust snuggly, so that only two fingertips can fit between your skin and the top of the cuff Check 2 readings separated by at least one minute Keep a log of your BP readings For a visual, please reference this diagram: http://ccnc.care/bpdiagram  Provider Name: [facility]     Phone: [phone]  Zone 1: ALL CLEAR  Continue to monitor your symptoms:  BP reading is less than 140 (top number) or less than 90 (bottom number)  No right upper stomach pain No headaches or seeing spots No feeling nauseated or throwing up No swelling in face and hands  Zone 2: CAUTION Call your doctor's office for any of the following:  BP reading is greater than 140 (top number) or greater than 90 (bottom number)  Stomach pain under your ribs in the middle or right side Headaches or seeing spots Feeling nauseated or throwing up Swelling in face and hands  Zone 3: EMERGENCY  Seek immediate medical care if you have any of the following:  BP reading is greater than160 (top number) or greater than 110 (bottom number) Severe headaches not improving with Tylenol Serious difficulty catching your breath Any worsening symptoms from Zone 2   Third Trimester of Pregnancy The third trimester is from week 29 through week 42, months 7 through 9. The third trimester is a time when the fetus is growing rapidly. At the end of the ninth month, the fetus is about 20 inches in length and weighs 6-10 pounds.  BODY CHANGES Your body goes through many changes during pregnancy. The changes vary from woman to woman.  Your weight will continue to increase. You can expect to gain 25-35 pounds (11-16 kg) by the end of the pregnancy. You may begin to get stretch marks on your hips, abdomen,  and breasts. You may urinate more often because the fetus is moving lower into your pelvis and pressing on your bladder. You may develop or continue to have heartburn as a result of your pregnancy. You may develop constipation because certain hormones are causing the muscles that push waste through your intestines to slow down. You may develop hemorrhoids or swollen, bulging veins (varicose veins). You may have pelvic pain because of the weight gain and pregnancy hormones relaxing your joints between the bones in your pelvis. Backaches may result from overexertion of the muscles supporting your posture. You may have changes in your hair. These can include thickening of your hair, rapid growth, and changes in texture. Some women also have hair loss during or after pregnancy, or hair that feels dry or thin. Your hair will most likely return to normal after your baby is born. Your breasts will continue to grow and be tender. A yellow discharge may leak from your breasts called colostrum. Your belly button may stick out. You may  feel short of breath because of your expanding uterus. You may notice the fetus "dropping," or moving lower in your abdomen. You may have a bloody mucus discharge. This usually occurs a few days to a week before labor begins. Your cervix becomes thin and soft (effaced) near your due date. WHAT TO EXPECT AT YOUR PRENATAL EXAMS  You will have prenatal exams every 2 weeks until week 36. Then, you will have weekly prenatal exams. During a routine prenatal visit: You will be weighed to make sure you and the fetus are growing normally. Your blood pressure is taken. Your abdomen will be measured to track your baby's growth. The fetal heartbeat will be listened to. Any test results from the previous visit will be discussed. You may have a cervical check near your due date to see if you have effaced. At around 36 weeks, your caregiver will check your cervix. At the same time, your  caregiver will also perform a test on the secretions of the vaginal tissue. This test is to determine if a type of bacteria, Group B streptococcus, is present. Your caregiver will explain this further. Your caregiver may ask you: What your birth plan is. How you are feeling. If you are feeling the baby move. If you have had any abnormal symptoms, such as leaking fluid, bleeding, severe headaches, or abdominal cramping. If you have any questions. Other tests or screenings that may be performed during your third trimester include: Blood tests that check for low iron levels (anemia). Fetal testing to check the health, activity level, and growth of the fetus. Testing is done if you have certain medical conditions or if there are problems during the pregnancy. FALSE LABOR You may feel small, irregular contractions that eventually go away. These are called Braxton Hicks contractions, or false labor. Contractions may last for hours, days, or even weeks before true labor sets in. If contractions come at regular intervals, intensify, or become painful, it is best to be seen by your caregiver.  SIGNS OF LABOR  Menstrual-like cramps. Contractions that are 5 minutes apart or less. Contractions that start on the top of the uterus and spread down to the lower abdomen and back. A sense of increased pelvic pressure or back pain. A watery or bloody mucus discharge that comes from the vagina. If you have any of these signs before the 37th week of pregnancy, call your caregiver right away. You need to go to the hospital to get checked immediately. HOME CARE INSTRUCTIONS  Avoid all smoking, herbs, alcohol, and unprescribed drugs. These chemicals affect the formation and growth of the baby. Follow your caregiver's instructions regarding medicine use. There are medicines that are either safe or unsafe to take during pregnancy. Exercise only as directed by your caregiver. Experiencing uterine cramps is a good sign to  stop exercising. Continue to eat regular, healthy meals. Wear a good support bra for breast tenderness. Do not use hot tubs, steam rooms, or saunas. Wear your seat belt at all times when driving. Avoid raw meat, uncooked cheese, cat litter boxes, and soil used by cats. These carry germs that can cause birth defects in the baby. Take your prenatal vitamins. Try taking a stool softener (if your caregiver approves) if you develop constipation. Eat more high-fiber foods, such as fresh vegetables or fruit and whole grains. Drink plenty of fluids to keep your urine clear or pale yellow. Take warm sitz baths to soothe any pain or discomfort caused by hemorrhoids. Use hemorrhoid cream if  your caregiver approves. If you develop varicose veins, wear support hose. Elevate your feet for 15 minutes, 3-4 times a day. Limit salt in your diet. Avoid heavy lifting, wear low heal shoes, and practice good posture. Rest a lot with your legs elevated if you have leg cramps or low back pain. Visit your dentist if you have not gone during your pregnancy. Use a soft toothbrush to brush your teeth and be gentle when you floss. A sexual relationship may be continued unless your caregiver directs you otherwise. Do not travel far distances unless it is absolutely necessary and only with the approval of your caregiver. Take prenatal classes to understand, practice, and ask questions about the labor and delivery. Make a trial run to the hospital. Pack your hospital bag. Prepare the baby's nursery. Continue to go to all your prenatal visits as directed by your caregiver. SEEK MEDICAL CARE IF: You are unsure if you are in labor or if your water has broken. You have dizziness. You have mild pelvic cramps, pelvic pressure, or nagging pain in your abdominal area. You have persistent nausea, vomiting, or diarrhea. You have a bad smelling vaginal discharge. You have pain with urination. SEEK IMMEDIATE MEDICAL CARE IF:  You  have a fever. You are leaking fluid from your vagina. You have spotting or bleeding from your vagina. You have severe abdominal cramping or pain. You have rapid weight loss or gain. You have shortness of breath with chest pain. You notice sudden or extreme swelling of your face, hands, ankles, feet, or legs. You have not felt your baby move in over an hour. You have severe headaches that do not go away with medicine. You have vision changes. Document Released: 09/22/2001 Document Revised: 10/03/2013 Document Reviewed: 11/29/2012 [facility] Patient Information 2015 [place], [place]. This information is not intended to replace advice given to you by your health care provider. Make sure you discuss any questions you have with your health care provider.

## 2022-12-23 NOTE — Progress Notes (Signed)
   LOW-RISK PREGNANCY VISIT Patient name: April Young MRN [id number]  Date of birth: [birth year] Chief Complaint:   Routine Prenatal Visit  History of Present Illness:   April Young is a 27 y.o. G31P1001 female at [redacted]w[redacted]d with an Estimated Date of Delivery: 03/16/23 being seen today for ongoing management of a low-risk pregnancy.  Today she reports  feeling teary frequently for no apparent reason; stated that she wouldn't be able to keep the glucola down without having a drink of something else beforehand (didn't do GTT today);of note, seemed quiet today . Contractions: Not present. Vag. Bleeding: None.  Movement: Present. denies leaking of fluid. Review of Systems:   Pertinent items are noted in HPI Denies abnormal vaginal discharge w/ itching/odor/irritation, headaches, visual changes, shortness of breath, chest pain, abdominal pain, severe nausea/vomiting, or problems with urination or bowel movements unless otherwise stated above. Pertinent History Reviewed:  Reviewed past medical,surgical, social, obstetrical and family history.  Reviewed problem list, medications and allergies. Physical Assessment:   Vitals:   12/23/22 0939  BP: 119/74  Pulse: 96  Weight: 135 lb (61.2 kg)  Body mass index is 20.53 kg/m.        Physical Examination:   General appearance: Well appearing, and in no distress  Mental status: Alert, oriented to person, place, and time  Skin: Warm & dry  Cardiovascular: Normal heart rate noted  Respiratory: Normal respiratory effort, no distress  Abdomen: Soft, gravid, nontender  Pelvic: Cervical exam deferred         Extremities:    Fetal Status: Fetal Heart Rate (bpm): 140 Fundal Height: 27 cm Movement: Present    No results found for this or any previous visit (from the past 24 hour(s)).  Assessment & Plan:  1) Low-risk pregnancy G2P1001 at [redacted]w[redacted]d with an Estimated Date of Delivery: 03/16/23   2) Ptyalism, doesn't think she can drink the glucola without  vomiting if she can't have a drink of something else beforehand; offered to try it later in the day if she is willing to fast x 6-8hrs- declines at this time; reviewed the complications of undiagnosed GDM, including still birth and LGA/SD  3) Rh neg, will get Rhogam at next visit  4) Desires ppBTL, 30d papers signed today  5) Hx FGR & 20wk EFW 99.7%, will get EFW @ 36wks; reviewed the timing of u/s with patient; she appeared to be upset that the u/s woudn't be for 8 more weeks   Meds: No orders of the defined types were placed in this encounter.  Labs/procedures today: declined GTT & Tdap; got the other PN2 labs  Plan:  Continue routine obstetrical care   Reviewed: Preterm labor symptoms and general obstetric precautions including but not limited to vaginal bleeding, contractions, leaking of fluid and fetal movement were reviewed in detail with the patient.  All questions were answered. Didn't ask about home bp cuff. Check bp weekly, let us know if >140/90.   Follow-up: Return in about 2 weeks (around 01/06/2023) for LROB, in person, Sign BTL consent today.  No orders of the defined types were placed in this encounter.  Myrtis Ser CNM 12/23/2022 10:15 AM

## 2022-12-24 ENCOUNTER — Other Ambulatory Visit: Payer: Self-pay | Admitting: Advanced Practice Midwife

## 2022-12-25 LAB — CBC
Hematocrit: 27.3 % — ABNORMAL LOW (ref 34.0–46.6)
Hemoglobin: 8.7 g/dL — ABNORMAL LOW (ref 11.1–15.9)
MCH: 26.9 pg (ref 26.6–33.0)
MCHC: 31.9 g/dL (ref 31.5–35.7)
MCV: 85 fL (ref 79–97)
Platelets: 190 10*3/uL (ref 150–450)
RBC: 3.23 x10E6/uL — ABNORMAL LOW (ref 3.77–5.28)
RDW: 12.5 % (ref 11.7–15.4)
WBC: 10.7 10*3/uL (ref 3.4–10.8)

## 2022-12-25 LAB — AB SCR+ANTIBODY ID: Antibody Screen: POSITIVE — AB

## 2022-12-25 LAB — RPR: RPR Ser Ql: NONREACTIVE

## 2022-12-25 LAB — HIV ANTIBODY (ROUTINE TESTING W REFLEX): HIV Screen 4th Generation wRfx: NONREACTIVE

## 2022-12-25 LAB — ANTIBODY SCREEN

## 2022-12-30 ENCOUNTER — Other Ambulatory Visit: Payer: Self-pay | Admitting: Advanced Practice Midwife

## 2022-12-30 ENCOUNTER — Encounter: Payer: Self-pay | Admitting: Advanced Practice Midwife

## 2022-12-31 ENCOUNTER — Telehealth: Payer: Self-pay | Admitting: Pharmacy Technician

## 2022-12-31 ENCOUNTER — Encounter: Payer: Self-pay | Admitting: Advanced Practice Midwife

## 2022-12-31 ENCOUNTER — Other Ambulatory Visit: Payer: Self-pay | Admitting: Pharmacy Technician

## 2022-12-31 DIAGNOSIS — D500 Iron deficiency anemia secondary to blood loss (chronic): Secondary | ICD-10-CM

## 2022-12-31 NOTE — Telephone Encounter (Signed)
April Young note:  Auth Submission: NO AUTH NEEDED Site of care: [facility] Payer: [place] medicaid health blue Medication & CPT/J Code(s) submitted: Venofer (Iron Sucrose) J1756 Route of submission (phone, fax, portal):  Phone # Fax # Auth type: Buy/Bill Units/visits requested: 2 Reference number:  Approval from: 12/31/22 to 05/02/23

## 2023-01-04 ENCOUNTER — Other Ambulatory Visit: Payer: Self-pay

## 2023-01-05 ENCOUNTER — Encounter (HOSPITAL_COMMUNITY)
Admission: RE | Admit: 2023-01-05 | Discharge: 2023-01-05 | Disposition: A | Discharge status: Home / Self Care | Payer: Medicaid Other | Source: Ambulatory Visit | Attending: Advanced Practice Midwife | Admitting: Advanced Practice Midwife

## 2023-01-05 VITALS — BP 89/49 | HR 79 | Temp 98.0°F | Resp 16

## 2023-01-05 DIAGNOSIS — O99019 Anemia complicating pregnancy, unspecified trimester: Secondary | ICD-10-CM

## 2023-01-05 DIAGNOSIS — D500 Iron deficiency anemia secondary to blood loss (chronic): Secondary | ICD-10-CM

## 2023-01-05 DIAGNOSIS — D509 Iron deficiency anemia, unspecified: Secondary | ICD-10-CM | POA: Insufficient documentation

## 2023-01-05 MED ORDER — ACETAMINOPHEN 325 MG PO TABS
650.0000 mg | ORAL_TABLET | Freq: Once | ORAL | Status: AC
  Administered 2023-01-05: 650 mg via ORAL
  Filled 2023-01-05: qty 2

## 2023-01-05 MED ORDER — DIPHENHYDRAMINE HCL 25 MG PO CAPS
25.0000 mg | ORAL_CAPSULE | Freq: Once | ORAL | Status: AC
  Administered 2023-01-05: 25 mg via ORAL
  Filled 2023-01-05: qty 1

## 2023-01-05 MED ORDER — IRON SUCROSE 500 MG IVPB - SIMPLE MED
500.0000 mg | Freq: Once | INTRAVENOUS | Status: AC
  Administered 2023-01-05: 500 mg via INTRAVENOUS
  Filled 2023-01-05: qty 275

## 2023-01-05 NOTE — Progress Notes (Signed)
Diagnosis: Iron Deficiency Anemia  Provider:  Serita Grammes CNM  Procedure: Infusion  IV Type: Peripheral, IV Location: L Upper Arm  Venofer (Iron Sucrose), Dose: 500 mg  Infusion Start Time: [id number]  Infusion Stop Time: [id number]  Post Infusion IV Care: Observation period completed and Peripheral IV Discontinued  Discharge: Condition: Good, Destination: Home . AVS Provided  Performed by:  Baxter Hire, RN      Serita Grammes CMN notified via secure chat about low B/P's. No further intervention ordered at this time.

## 2023-01-06 ENCOUNTER — Telehealth (HOSPITAL_COMMUNITY): Payer: Self-pay | Admitting: *Deleted

## 2023-01-07 ENCOUNTER — Encounter (HOSPITAL_COMMUNITY): Payer: Self-pay

## 2023-01-07 ENCOUNTER — Telehealth (INDEPENDENT_AMBULATORY_CARE_PROVIDER_SITE_OTHER): Payer: Medicaid Other | Admitting: Advanced Practice Midwife

## 2023-01-07 ENCOUNTER — Emergency Department (HOSPITAL_COMMUNITY)
Admission: EM | Admit: 2023-01-07 | Discharge: 2023-01-07 | Disposition: A | Discharge status: Home / Self Care | Payer: Medicaid Other | Attending: Emergency Medicine | Admitting: Emergency Medicine

## 2023-01-07 ENCOUNTER — Other Ambulatory Visit: Payer: Self-pay

## 2023-01-07 ENCOUNTER — Encounter: Payer: Self-pay | Admitting: Advanced Practice Midwife

## 2023-01-07 VITALS — BP 131/77 | HR 85

## 2023-01-07 DIAGNOSIS — O360130 Maternal care for anti-D [Rh] antibodies, third trimester, not applicable or unspecified: Secondary | ICD-10-CM

## 2023-01-07 DIAGNOSIS — R8290 Unspecified abnormal findings in urine: Secondary | ICD-10-CM | POA: Insufficient documentation

## 2023-01-07 DIAGNOSIS — R112 Nausea with vomiting, unspecified: Secondary | ICD-10-CM

## 2023-01-07 DIAGNOSIS — Z6791 Unspecified blood type, Rh negative: Secondary | ICD-10-CM

## 2023-01-07 DIAGNOSIS — O09293 Supervision of pregnancy with other poor reproductive or obstetric history, third trimester: Secondary | ICD-10-CM

## 2023-01-07 DIAGNOSIS — E860 Dehydration: Secondary | ICD-10-CM | POA: Insufficient documentation

## 2023-01-07 DIAGNOSIS — Z302 Encounter for sterilization: Secondary | ICD-10-CM

## 2023-01-07 DIAGNOSIS — O219 Vomiting of pregnancy, unspecified: Secondary | ICD-10-CM | POA: Diagnosis not present

## 2023-01-07 DIAGNOSIS — Z8759 Personal history of other complications of pregnancy, childbirth and the puerperium: Secondary | ICD-10-CM

## 2023-01-07 DIAGNOSIS — Z3A30 30 weeks gestation of pregnancy: Secondary | ICD-10-CM | POA: Diagnosis not present

## 2023-01-07 DIAGNOSIS — Z3A32 32 weeks gestation of pregnancy: Secondary | ICD-10-CM

## 2023-01-07 DIAGNOSIS — D500 Iron deficiency anemia secondary to blood loss (chronic): Secondary | ICD-10-CM

## 2023-01-07 DIAGNOSIS — O99013 Anemia complicating pregnancy, third trimester: Secondary | ICD-10-CM

## 2023-01-07 DIAGNOSIS — R8271 Bacteriuria: Secondary | ICD-10-CM

## 2023-01-07 DIAGNOSIS — Z3480 Encounter for supervision of other normal pregnancy, unspecified trimester: Secondary | ICD-10-CM

## 2023-01-07 LAB — BASIC METABOLIC PANEL
Anion gap: 10 (ref 5–15)
BUN: 8 mg/dL (ref 6–20)
CO2: 22 mmol/L (ref 22–32)
Calcium: 8.8 mg/dL — ABNORMAL LOW (ref 8.9–10.3)
Chloride: 103 mmol/L (ref 98–111)
Creatinine, Ser: 0.52 mg/dL (ref 0.44–1.00)
GFR, Estimated: >60 mL/min (ref >60)
Glucose, Bld: 96 mg/dL (ref 70–99)
Potassium: 3.1 mmol/L — ABNORMAL LOW (ref 3.5–5.1)
Sodium: 135 mmol/L (ref 135–145)

## 2023-01-07 LAB — CBC WITH DIFFERENTIAL/PLATELET
Abs Immature Granulocytes: 0.47 10*3/uL — ABNORMAL HIGH (ref 0.00–0.07)
Basophils Absolute: 0.1 10*3/uL (ref 0.0–0.1)
Basophils Relative: 1 %
Eosinophils Absolute: 0.2 10*3/uL (ref 0.0–0.5)
Eosinophils Relative: 1 %
HCT: 30.3 % — ABNORMAL LOW (ref 36.0–46.0)
Hemoglobin: 9.6 g/dL — ABNORMAL LOW (ref 12.0–15.0)
Immature Granulocytes: 4 %
Lymphocytes Relative: 15 %
Lymphs Abs: 1.9 10*3/uL (ref 0.7–4.0)
MCH: 26.2 pg (ref 26.0–34.0)
MCHC: 31.7 g/dL (ref 30.0–36.0)
MCV: 82.8 fL (ref 80.0–100.0)
Monocytes Absolute: 1.1 10*3/uL — ABNORMAL HIGH (ref 0.1–1.0)
Monocytes Relative: 9 %
Neutro Abs: 9.2 10*3/uL — ABNORMAL HIGH (ref 1.7–7.7)
Neutrophils Relative %: 70 %
Platelets: 245 10*3/uL (ref 150–400)
RBC: 3.66 MIL/uL — ABNORMAL LOW (ref 3.87–5.11)
RDW: 13.4 % (ref 11.5–15.5)
WBC: 12.8 10*3/uL — ABNORMAL HIGH (ref 4.0–10.5)
nRBC: 0 % (ref 0.0–0.2)

## 2023-01-07 LAB — URINALYSIS, ROUTINE W REFLEX MICROSCOPIC
Bilirubin Urine: NEGATIVE
Glucose, UA: NEGATIVE mg/dL
Hgb urine dipstick: NEGATIVE
Ketones, ur: 20 mg/dL — AB
Nitrite: NEGATIVE
Protein, ur: 100 mg/dL — AB
Specific Gravity, Urine: 1.02 (ref 1.005–1.030)
pH: 6 (ref 5.0–8.0)

## 2023-01-07 MED ORDER — METOCLOPRAMIDE HCL 10 MG PO TABS: 10.0000 mg | ORAL_TABLET | Freq: Four times a day (QID) | ORAL | 3 refills | Status: DC | PRN

## 2023-01-07 MED ORDER — ONDANSETRON 4 MG PO TBDP: ORAL_TABLET | ORAL | 0 refills | Status: DC

## 2023-01-07 MED ORDER — POTASSIUM CHLORIDE CRYS ER 20 MEQ PO TBCR
40.0000 meq | EXTENDED_RELEASE_TABLET | Freq: Once | ORAL | Status: AC
  Administered 2023-01-07: 40 meq via ORAL
  Filled 2023-01-07: qty 2

## 2023-01-07 MED ORDER — ONDANSETRON HCL 4 MG/2ML IJ SOLN
4.0000 mg | Freq: Once | INTRAMUSCULAR | Status: AC
  Administered 2023-01-07: 4 mg via INTRAVENOUS
  Filled 2023-01-07: qty 2

## 2023-01-07 MED ORDER — SODIUM CHLORIDE 0.9 % IV BOLUS
1000.0000 mL | Freq: Once | INTRAVENOUS | Status: AC
  Administered 2023-01-07: 1000 mL via INTRAVENOUS

## 2023-01-07 MED ORDER — TRANSDERM-SCOP 1 MG/3DAYS TD PT72: MEDICATED_PATCH | TRANSDERMAL | 12 refills | Status: DC

## 2023-01-07 MED ORDER — IRON GLYCINATE 29 MG PO CAPS: 1.0000 | ORAL_CAPSULE | ORAL | 5 refills | Status: DC

## 2023-01-07 NOTE — ED Provider Notes (Signed)
[place] Provider Note   CSN: [id number] Arrival date & time: 01/07/23  2030     History  Chief Complaint  Patient presents with   Emesis    April Young is a 27 y.o. female.  Patient states she is [redacted] weeks pregnant and she started throwing up couple days ago.  Patient is not having any pain.  Patient also not having any urinary symptoms  The history is provided by the patient and medical records. No language interpreter was used.  Emesis Severity:  Mild Timing:  Constant Quality:  Bilious material Able to tolerate:  Liquids Progression:  Unchanged Chronicity:  New Recent urination:  Normal Relieved by:  Nothing Associated symptoms: no abdominal pain, no cough, no diarrhea and no headaches        Home Medications Prior to Admission medications   Medication Sig Start Date End Date Taking? Authorizing Provider  ondansetron (ZOFRAN-ODT) 4 MG disintegrating tablet 4mg  ODT q6 hours prn nausea/vomit 01/07/23  Yes Milton Ferguson, MD  Blood Pressure Monitor MISC For regular home bp monitoring during pregnancy 09/07/22   Cresenzo-Dishmon, Joaquim Lai, CNM  famotidine (PEPCID) 20 MG tablet Take 1 tablet (20 mg total) by mouth 2 (two) times daily. Patient not taking: Reported on 01/07/2023 12/08/22   Roma Schanz, CNM  glycopyrrolate (ROBINUL) 1 MG tablet Take 1 tablet (1 mg total) by mouth 3 (three) times daily. Patient not taking: Reported on 01/07/2023 12/08/22   Roma Schanz, CNM  Iron Glycinate 29 MG CAPS Take 1 capsule by mouth every other day. 01/07/23   Cresenzo-Dishmon, Joaquim Lai, CNM  metoCLOPramide (REGLAN) 10 MG tablet Take 1 tablet (10 mg total) by mouth every 6 (six) hours as needed for nausea. 01/07/23   Cresenzo-Dishmon, Joaquim Lai, CNM  nitrofurantoin, macrocrystal-monohydrate, (MACROBID) 100 MG capsule Take 1 capsule (100 mg total) by mouth at bedtime. Patient not taking: Reported on 01/07/2023 12/08/22   Roma Schanz, CNM  ondansetron (ZOFRAN-ODT) 4 MG disintegrating tablet Take 1 tablet (4 mg total) by mouth every 6 (six) hours as needed for nausea. 09/07/22   Cresenzo-Dishmon, Joaquim Lai, CNM  promethazine (PHENERGAN) 12.5 MG tablet Take 1 tablet (12.5 mg total) by mouth every 6 (six) hours as needed for nausea or vomiting. Patient not taking: Reported on 09/07/2022 07/31/22   Darlina Rumpf, CNM  scopolamine (TRANSDERM-SCOP) 1 MG/3DAYS Place one patch behind ear every 3 days 01/07/23   Christin Fudge, CNM      Allergies    Penicillins    Review of Systems   Review of Systems  Constitutional:  Negative for appetite change and fatigue.  HENT:  Negative for congestion, ear discharge and sinus pressure.   Eyes:  Negative for discharge.  Respiratory:  Negative for cough.   Cardiovascular:  Negative for chest pain.  Gastrointestinal:  Positive for vomiting. Negative for abdominal pain and diarrhea.  Genitourinary:  Negative for frequency and hematuria.  Musculoskeletal:  Negative for back pain.  Skin:  Negative for rash.  Neurological:  Negative for seizures and headaches.  Psychiatric/Behavioral:  Negative for hallucinations.     Physical Exam Updated Vital Signs BP 105/74 (BP Location: Right Arm)   Pulse (!) 106   Temp 98.4 F (36.9 C) (Oral)   Resp 20   Ht 5\' 8"  (1.727 m)   Wt 60.8 kg   LMP 01/05/2022 (Exact Date)   SpO2 96%   BMI 20.37 kg/m  Physical Exam Vitals and nursing note reviewed.  Constitutional:      Appearance: She is well-developed.  HENT:     Head: Normocephalic.     Mouth/Throat:     Mouth: Mucous membranes are moist.  Eyes:     General: No scleral icterus.    Conjunctiva/sclera: Conjunctivae normal.  Neck:     Thyroid: No thyromegaly.  Cardiovascular:     Rate and Rhythm: Normal rate and regular rhythm.     Heart sounds: No murmur heard.    No friction rub. No gallop.  Pulmonary:     Breath sounds: No stridor. No wheezing or rales.   Chest:     Chest wall: No tenderness.  Abdominal:     General: There is no distension.     Tenderness: There is no abdominal tenderness. There is no rebound.     Comments: Abdomen not tender but consistent with 30-week pregnancy  Musculoskeletal:        General: Normal range of motion.     Cervical back: Neck supple.  Lymphadenopathy:     Cervical: No cervical adenopathy.  Skin:    Findings: No erythema or rash.  Neurological:     Mental Status: She is alert and oriented to person, place, and time.     Motor: No abnormal muscle tone.     Coordination: Coordination normal.  Psychiatric:        Behavior: Behavior normal.     ED Results / Procedures / Treatments   Labs (all labs ordered are listed, but only abnormal results are displayed) Labs Reviewed  CBC WITH DIFFERENTIAL/PLATELET - Abnormal; Notable for the following components:      Result Value   WBC 12.8 (*)    RBC 3.66 (*)    Hemoglobin 9.6 (*)    HCT 30.3 (*)    Neutro Abs 9.2 (*)    Monocytes Absolute 1.1 (*)    Abs Immature Granulocytes 0.47 (*)    All other components within normal limits  BASIC METABOLIC PANEL - Abnormal; Notable for the following components:   Potassium 3.1 (*)    Calcium 8.8 (*)    All other components within normal limits  URINALYSIS, ROUTINE W REFLEX MICROSCOPIC - Abnormal; Notable for the following components:   Color, Urine AMBER (*)    APPearance HAZY (*)    Ketones, ur 20 (*)    Protein, ur 100 (*)    Leukocytes,Ua TRACE (*)    Bacteria, UA MANY (*)    All other components within normal limits  URINE CULTURE    EKG None  Radiology No results found.  Procedures Procedures    Medications Ordered in ED Medications  potassium chloride SA (KLOR-CON M) CR tablet 40 mEq (has no administration in time range)  sodium chloride 0.9 % bolus 1,000 mL (1,000 mLs Intravenous New Bag/Given 01/07/23 2121)  ondansetron (ZOFRAN) injection 4 mg (4 mg Intravenous Given 01/07/23 2121)     ED Course/ Medical Decision Making/ A&P                             Medical Decision Making Amount and/or Complexity of Data Reviewed Labs: ordered.  Risk Prescription drug management.  This patient presents to the ED for concern of vomiting, this involves an extensive number of treatment options, and is a complaint that carries with it a high risk of complications and morbidity.  The differential diagnosis includes UTI, pregnancy induced vomiting   Co morbidities that complicate the  patient evaluation  Intrauterine pregnancy   Additional history obtained:  Additional history obtained from patient External records from outside source obtained and reviewed including hospital records   Lab Tests:  I Ordered, and personally interpreted labs.  The pertinent results include: White count 12.8 hemoglobin 9.6, potassium 3.1   Imaging Studies ordered:  No imaging  Cardiac Monitoring: / EKG:  The patient was maintained on a cardiac monitor.  I personally viewed and interpreted the cardiac monitored which showed an underlying rhythm of: Normal sinus rhythm   Consultations Obtained:  No consultant  Problem List / ED Course / Critical interventions / Medication management  Intrauterine pregnancy and vomiting I ordered medication including open and normal saline Reevaluation of the patient after these medicines showed that the patient improved I have reviewed the patients home medicines and have made adjustments as needed   Social Determinants of Health:  None   Test / Admission - Considered:  None  Patient with vomiting and [redacted] weeks pregnant.  Mild dehydration.  She will follow-up with her PCP next week        Final Clinical Impression(s) / ED Diagnoses Final diagnoses:  Nausea and vomiting, unspecified vomiting type    Rx / DC Orders ED Discharge Orders          Ordered    ondansetron (ZOFRAN-ODT) 4 MG disintegrating tablet        01/07/23 2211               Milton Ferguson, MD 01/09/23 1114

## 2023-01-07 NOTE — Discharge Instructions (Signed)
Call your OB/GYN tomorrow and let them know that you are here.  Follow-up with them next week

## 2023-01-07 NOTE — Patient Instructions (Addendum)
1. Before your test, do not eat or drink anything for 8-10 hours prior to your  appointment (a small amount of water is allowed and you may take any medicines you normally take). Be sure to drink lots of water the day before. 2. When you arrive, your blood will be drawn for a 'fasting' blood sugar level.  Then you will be given a sweetened carbonated beverage to drink. You should  complete drinking this beverage within five minutes. After finishing the  beverage, you will have your blood drawn exactly 1 and 2 hours later. Having  your blood drawn on time is an important part of this test. A total of three blood  samples will be done. 3. The test takes approximately 2  hours. During the test, do not have anything to  eat or drink. Do not smoke, chew gum (not even sugarless gum) or use breath mints.  4. During the test you should remain close by and seated as much as possible and  avoid walking around. You may want to bring a book or something else to  occupy your time.  5. After your test, you may eat and drink as normal. You may want to bring a snack  to eat after the test is finished. Your provider will advise you as to the results of  this test and any follow-up if necessary  If your sugar test is positive for gestational diabetes, you will be given an phone call and further instructions discussed. If you wish to know all of your test results before your next appointment, feel free to call the office, or look up your test results on Mychart.  (The range that the lab uses for normal values of the sugar test are not necessarily the range that is used for pregnant women; if your results are within the normal range, they are definitely normal.  However, if a value is deemed "high" by the lab, it may not be too high for a pregnant woman.  We will need to discuss the results if your value(s) fall in the "high" category).     Tdap Vaccine It is recommended that you get the Tdap vaccine during the  third trimester of EACH pregnancy to help protect your baby from getting pertussis (whooping cough) 27-36 weeks is the BEST time to do this so that you can pass the protection on to your baby. During pregnancy is better than after pregnancy, but if you are unable to get it during pregnancy it will be offered at the hospital. You will be offered this vaccine in the office after 27 weeks.  If you do not have health insurance, you can get the vaccine from the [facility] Department (no appointment needed).  Everyone who will be around your baby should also be up-to-date on their vaccines. Adults (who are not pregnant) only need 1 dose of Tdap during adulthood.    Eat 28 Brach's Jelly Beans (within 10 minutes) and draw a glucose level one hour after

## 2023-01-07 NOTE — Progress Notes (Signed)
I connected with April Young 01/07/23 at  3:50 PM EDT by: MyChart video and verified that I am speaking with the correct person using two identifiers.  Patient is located at home and provider is located at [employer].     The purpose of this virtual visit is to provide medical care while limiting exposure to the novel coronavirus. I discussed the limitations, risks, security and privacy concerns of performing an evaluation and management service by MyChart video and the availability of in person appointments. I also discussed with the patient that there may be a patient responsible charge related to this service. By engaging in this virtual visit, you consent to the provision of healthcare.  Additionally, you authorize for your insurance to be billed for the services provided during this visit.  The patient expressed understanding and agreed to proceed.  The following staff members participated in the virtual visit:  Alice Rieger, RN    TELE-PRENATAL VISIT NOTE  Subjective:  April Young is a 27 y.o. G2P1001 at [redacted]w[redacted]d  for phone visit for ongoing prenatal care.  She is currently monitored for the following issues for this low-risk pregnancy and has Anemia during pregnancy; History of prior pregnancy with IUGR newborn; Family history of neoplasm of breast; Abnormal antibody titer; Encounter for supervision of normal pregnancy, antepartum; Rh negative state in antepartum period; ASB (asymptomatic bacteriuria); Abnormal genetic test; Request for sterilization; and Iron deficiency anemia due to chronic blood loss on their problem list.  Patient reports feeling very sick w/N&V since getting IV venofer. Has phenergan and zofran, not really helping.  Hasn't had to take the robinul in a while. Still have N/V, but not this bad.  Feels weak.  States that she's not sure if baby would pass kick counts (% or more in an hour).  Contractions: Not present. Vag. Bleeding: None.  Movement: (!)  Decreased. Denies leaking of fluid.   The following portions of the patient's history were reviewed and updated as appropriate: allergies, current medications, past family history, past medical history, past social history, past surgical history and problem list.   Objective:   Vitals:   01/07/23 1402  BP: 131/77  Pulse: 85   Self-Obtained  Fetal Status:     Movement: (!) Decreased     Assessment and Plan:  Pregnancy: G2P1001 at [redacted]w[redacted]d 1. Supervision of other normal pregnancy, antepartum    2. Rh negative state in antepartum period Needs rhogam next visit  5. History of prior pregnancy with IUGR newborn EFW at 20 weeks 99/7 %, will check EFW 36 weeks per previous discussion w/Kim Shaw - [facility] Follow Up; Future  6. Iron deficiency anemia due to chronic blood loss Oral iron QOD   7.  N/V:  refilled scopolomine, add reglan prn  8.  Declines GTT:  discussed testing BS QID for 2 weeks or use jelly beans (28 Brach's).  Will try jelly beans.    PTL symptoms and general obstetric precautions including but not limited to vaginal bleeding, contractions, leaking of fluid and fetal movement were reviewed in detail with the patient. Do kick counts:  if baby doesn't move 5 or more times in an hour, come to office for NST  No follow-ups on file.  Future Appointments  Date Time Provider [place]  01/07/2023  3:50 PM Christin Fudge, CNM [facility]  01/19/2023  9:30 AM [facility] INFUSION 1 [facility] None     Time spent on virtual visit: 15 minutes  Joaquim Lai  Cresenzo-Dishmon, CNM

## 2023-01-07 NOTE — ED Triage Notes (Signed)
Pt presents with persistent N/V following an iron infusion that was performed on 3/26. Pt states she had been unable to keep any food or water down. Pt is [redacted] weeks pregnant.

## 2023-01-09 LAB — URINE CULTURE

## 2023-01-14 ENCOUNTER — Other Ambulatory Visit: Payer: Self-pay | Admitting: Obstetrics & Gynecology

## 2023-01-15 ENCOUNTER — Other Ambulatory Visit: Payer: Self-pay | Admitting: *Deleted

## 2023-01-19 ENCOUNTER — Encounter (HOSPITAL_COMMUNITY): Admission: RE | Admit: 2023-01-19 | Payer: Medicaid Other | Source: Ambulatory Visit

## 2023-01-20 ENCOUNTER — Other Ambulatory Visit: Payer: Self-pay

## 2023-01-20 ENCOUNTER — Encounter: Payer: Self-pay | Admitting: Advanced Practice Midwife

## 2023-01-20 ENCOUNTER — Ambulatory Visit (INDEPENDENT_AMBULATORY_CARE_PROVIDER_SITE_OTHER): Payer: Medicaid Other | Admitting: Advanced Practice Midwife

## 2023-01-20 VITALS — BP 97/60 | HR 86 | Wt 142.0 lb

## 2023-01-20 DIAGNOSIS — Z3480 Encounter for supervision of other normal pregnancy, unspecified trimester: Secondary | ICD-10-CM

## 2023-01-20 DIAGNOSIS — O99019 Anemia complicating pregnancy, unspecified trimester: Secondary | ICD-10-CM

## 2023-01-20 DIAGNOSIS — O26893 Other specified pregnancy related conditions, third trimester: Secondary | ICD-10-CM

## 2023-01-20 DIAGNOSIS — O26899 Other specified pregnancy related conditions, unspecified trimester: Secondary | ICD-10-CM

## 2023-01-20 DIAGNOSIS — Z09 Encounter for follow-up examination after completed treatment for conditions other than malignant neoplasm: Secondary | ICD-10-CM

## 2023-01-20 DIAGNOSIS — O99013 Anemia complicating pregnancy, third trimester: Secondary | ICD-10-CM

## 2023-01-20 DIAGNOSIS — Z3A32 32 weeks gestation of pregnancy: Secondary | ICD-10-CM

## 2023-01-20 DIAGNOSIS — Z6791 Unspecified blood type, Rh negative: Secondary | ICD-10-CM

## 2023-01-20 NOTE — Patient Instructions (Signed)
April Young, thank you for choosing our office today! We appreciate the opportunity to meet your healthcare needs. You may receive a short survey by mail, e-mail, or through MyChart. If you are happy with your care we would appreciate if you could take just a few minutes to complete the survey questions. We read all of your comments and take your feedback very seriously. Thank you again for choosing our office.  [facility] ([address] [place], [place] [address]) Entrance C, located off of [address] Free 24/7 valet parking   CLASSES: Go to [email or web address] to register for classes (childbirth, breastfeeding, waterbirth, infant CPR, daddy bootcamp, etc.)  Call the office ([phone]) or go to [facility] if: You begin to have strong, frequent contractions Your water breaks.  Sometimes it is a big gush of fluid, sometimes it is just a trickle that keeps getting your panties wet or running down your legs You have vaginal bleeding.  It is normal to have a small amount of spotting if your cervix was checked.  You don't feel your baby moving like normal.  If you don't, get you something to eat and drink and lay down and focus on feeling your baby move.   If your baby is still not moving like normal, you should call the office or go to [facility].  Call the office ([phone]) or go to [facility] for these signs of pre-eclampsia: Severe headache that does not go away with Tylenol Visual changes- seeing spots, double, blurred vision Pain under your right breast or upper abdomen that does not go away with Tums or heartburn medicine Nausea and/or vomiting Severe swelling in your hands, feet, and face   Tdap Vaccine It is recommended that you get the Tdap vaccine during the third trimester of EACH pregnancy to help protect your baby from getting pertussis (whooping cough) 27-36 weeks is the BEST time to do  this so that you can pass the protection on to your baby. During pregnancy is better than after pregnancy, but if you are unable to get it during pregnancy it will be offered at the hospital.  You can get this vaccine with us, at the health department, your family doctor, or some local pharmacies Everyone who will be around your baby should also be up-to-date on their vaccines before the baby comes. Adults (who are not pregnant) only need 1 dose of Tdap during adulthood.   [place] Pediatricians/Family Doctors [facility] ([facility]): [address], [phone]           [facility]: [address], [phone]                [facility] ([facility]): [address], [phone] (call to ask if accepting patients) [facility]: [address], [place], [phone]    [place] Pediatricians/Family Doctors [facility] ([facility]): [address], [phone] [facility]: [address], [phone] [facility] [place]: [address], [phone]  [place] [facility]  [facility] ([facility]): [phone] [facility]: [address], [phone]   [place] [facility]: [address], [phone]  [place] [facility]  [facility]: [phone], [phone]  Home Blood Pressure Monitoring for Patients   Your provider has recommended that you check your   blood pressure (BP) at least once a week at home. If you do not have a blood pressure cuff at home, one will be provided for you. Contact your provider if you have not received your monitor within 1 week.   Helpful Tips for Accurate Home Blood Pressure Checks  Don't smoke, exercise, or drink caffeine 30 minutes before checking your BP Use the restroom before checking your BP (a full bladder can raise your  pressure) Relax in a comfortable upright chair Feet on the ground Left arm resting comfortably on a flat surface at the level of your heart Legs uncrossed Back supported Sit quietly and don't talk Place the cuff on your bare arm Adjust snuggly, so that only two fingertips can fit between your skin and the top of the cuff Check 2 readings separated by at least one minute Keep a log of your BP readings For a visual, please reference this diagram: http://ccnc.care/bpdiagram  Provider Name: [facility]     Phone: [phone]  Zone 1: ALL CLEAR  Continue to monitor your symptoms:  BP reading is less than 140 (top number) or less than 90 (bottom number)  No right upper stomach pain No headaches or seeing spots No feeling nauseated or throwing up No swelling in face and hands  Zone 2: CAUTION Call your doctor's office for any of the following:  BP reading is greater than 140 (top number) or greater than 90 (bottom number)  Stomach pain under your ribs in the middle or right side Headaches or seeing spots Feeling nauseated or throwing up Swelling in face and hands  Zone 3: EMERGENCY  Seek immediate medical care if you have any of the following:  BP reading is greater than160 (top number) or greater than 110 (bottom number) Severe headaches not improving with Tylenol Serious difficulty catching your breath Any worsening symptoms from Zone 2   Third Trimester of Pregnancy The third trimester is from week 29 through week 42, months 7 through 9. The third trimester is a time when the fetus is growing rapidly. At the end of the ninth month, the fetus is about 20 inches in length and weighs 6-10 pounds.  BODY CHANGES Your body goes through many changes during pregnancy. The changes vary from woman to woman.  Your weight will continue to increase. You can expect to gain 25-35 pounds (11-16 kg) by the end of the pregnancy. You may begin to get stretch marks on your hips, abdomen,  and breasts. You may urinate more often because the fetus is moving lower into your pelvis and pressing on your bladder. You may develop or continue to have heartburn as a result of your pregnancy. You may develop constipation because certain hormones are causing the muscles that push waste through your intestines to slow down. You may develop hemorrhoids or swollen, bulging veins (varicose veins). You may have pelvic pain because of the weight gain and pregnancy hormones relaxing your joints between the bones in your pelvis. Backaches may result from overexertion of the muscles supporting your posture. You may have changes in your hair. These can include thickening of your hair, rapid growth, and changes in texture. Some women also have hair loss during or after pregnancy, or hair that feels dry or thin. Your hair will most likely return to normal after your baby is born. Your breasts will continue to grow and be tender. A yellow discharge may leak from your breasts called colostrum. Your belly button may stick out. You may   feel short of breath because of your expanding uterus. You may notice the fetus "dropping," or moving lower in your abdomen. You may have a bloody mucus discharge. This usually occurs a few days to a week before labor begins. Your cervix becomes thin and soft (effaced) near your due date. WHAT TO EXPECT AT YOUR PRENATAL EXAMS  You will have prenatal exams every 2 weeks until week 36. Then, you will have weekly prenatal exams. During a routine prenatal visit: You will be weighed to make sure you and the fetus are growing normally. Your blood pressure is taken. Your abdomen will be measured to track your baby's growth. The fetal heartbeat will be listened to. Any test results from the previous visit will be discussed. You may have a cervical check near your due date to see if you have effaced. At around 36 weeks, your caregiver will check your cervix. At the same time, your  caregiver will also perform a test on the secretions of the vaginal tissue. This test is to determine if a type of bacteria, Group B streptococcus, is present. Your caregiver will explain this further. Your caregiver may ask you: What your birth plan is. How you are feeling. If you are feeling the baby move. If you have had any abnormal symptoms, such as leaking fluid, bleeding, severe headaches, or abdominal cramping. If you have any questions. Other tests or screenings that may be performed during your third trimester include: Blood tests that check for low iron levels (anemia). Fetal testing to check the health, activity level, and growth of the fetus. Testing is done if you have certain medical conditions or if there are problems during the pregnancy. FALSE LABOR You may feel small, irregular contractions that eventually go away. These are called Braxton Hicks contractions, or false labor. Contractions may last for hours, days, or even weeks before true labor sets in. If contractions come at regular intervals, intensify, or become painful, it is best to be seen by your caregiver.  SIGNS OF LABOR  Menstrual-like cramps. Contractions that are 5 minutes apart or less. Contractions that start on the top of the uterus and spread down to the lower abdomen and back. A sense of increased pelvic pressure or back pain. A watery or bloody mucus discharge that comes from the vagina. If you have any of these signs before the 37th week of pregnancy, call your caregiver right away. You need to go to the hospital to get checked immediately. HOME CARE INSTRUCTIONS  Avoid all smoking, herbs, alcohol, and unprescribed drugs. These chemicals affect the formation and growth of the baby. Follow your caregiver's instructions regarding medicine use. There are medicines that are either safe or unsafe to take during pregnancy. Exercise only as directed by your caregiver. Experiencing uterine cramps is a good sign to  stop exercising. Continue to eat regular, healthy meals. Wear a good support bra for breast tenderness. Do not use hot tubs, steam rooms, or saunas. Wear your seat belt at all times when driving. Avoid raw meat, uncooked cheese, cat litter boxes, and soil used by cats. These carry germs that can cause birth defects in the baby. Take your prenatal vitamins. Try taking a stool softener (if your caregiver approves) if you develop constipation. Eat more high-fiber foods, such as fresh vegetables or fruit and whole grains. Drink plenty of fluids to keep your urine clear or pale yellow. Take warm sitz baths to soothe any pain or discomfort caused by hemorrhoids. Use hemorrhoid cream if   your caregiver approves. If you develop varicose veins, wear support hose. Elevate your feet for 15 minutes, 3-4 times a day. Limit salt in your diet. Avoid heavy lifting, wear low heal shoes, and practice good posture. Rest a lot with your legs elevated if you have leg cramps or low back pain. Visit your dentist if you have not gone during your pregnancy. Use a soft toothbrush to brush your teeth and be gentle when you floss. A sexual relationship may be continued unless your caregiver directs you otherwise. Do not travel far distances unless it is absolutely necessary and only with the approval of your caregiver. Take prenatal classes to understand, practice, and ask questions about the labor and delivery. Make a trial run to the hospital. Pack your hospital bag. Prepare the baby's nursery. Continue to go to all your prenatal visits as directed by your caregiver. SEEK MEDICAL CARE IF: You are unsure if you are in labor or if your water has broken. You have dizziness. You have mild pelvic cramps, pelvic pressure, or nagging pain in your abdominal area. You have persistent nausea, vomiting, or diarrhea. You have a bad smelling vaginal discharge. You have pain with urination. SEEK IMMEDIATE MEDICAL CARE IF:  You  have a fever. You are leaking fluid from your vagina. You have spotting or bleeding from your vagina. You have severe abdominal cramping or pain. You have rapid weight loss or gain. You have shortness of breath with chest pain. You notice sudden or extreme swelling of your face, hands, ankles, feet, or legs. You have not felt your baby move in over an hour. You have severe headaches that do not go away with medicine. You have vision changes. Document Released: 09/22/2001 Document Revised: 10/03/2013 Document Reviewed: 11/29/2012 ExitCare Patient Information 2015 [employer]. This information is not intended to replace advice given to you by your health care provider. Make sure you discuss any questions you have with your health care provider.       

## 2023-01-20 NOTE — Progress Notes (Signed)
   LOW-RISK PREGNANCY VISIT Patient name: April Young MRN [id number]  Date of birth: 1995-11-17 Chief Complaint:   Routine Prenatal Visit  History of Present Illness:   April Young is a 27 y.o. G81P1001 female at [redacted]w[redacted]d with an Estimated Date of Delivery: 03/16/23 being seen today for ongoing management of a low-risk pregnancy.  Today she reports  not being able to eat x 4d after the Venofer infusion- declines further, but is feeling better now; forgot about jelly bean GTT . Contractions: Not present.  .  Movement: Present. denies leaking of fluid. Review of Systems:   Pertinent items are noted in HPI Denies abnormal vaginal discharge w/ itching/odor/irritation, headaches, visual changes, shortness of breath, chest pain, abdominal pain, severe nausea/vomiting, or problems with urination or bowel movements unless otherwise stated above. Pertinent History Reviewed:  Reviewed past medical,surgical, social, obstetrical and family history.  Reviewed problem list, medications and allergies. Physical Assessment:   Vitals:   01/20/23 1545  BP: 97/60  Pulse: 86  Weight: 142 lb (64.4 kg)  Body mass index is 21.59 kg/m.        Physical Examination:   General appearance: Well appearing, and in no distress  Mental status: Alert, oriented to person, place, and time  Skin: Warm & dry  Cardiovascular: Normal heart rate noted  Respiratory: Normal respiratory effort, no distress  Abdomen: Soft, gravid, nontender  Pelvic: Cervical exam deferred         Extremities: Edema: None  Fetal Status: Fetal Heart Rate (bpm): 137 Fundal Height: 31 cm Movement: Present    No results found for this or any previous visit (from the past 24 hour(s)).  Assessment & Plan:  1) Low-risk pregnancy G2P1001 at [redacted]w[redacted]d with an Estimated Date of Delivery: 03/16/23   2) Rh neg, Rhogam today  3) Declines glucola drink, will scheduled ASAP PN2 w jelly beans (has info sheet per Drenda Freeze)  4) Anemia, s/p Venofer x 1;  will start Fe qod (hasn't gotten from pharmacy yet)  5) Hx FGR, has u/s for EFW ~36wks already scheduled  6) Freq ASB, not taking suppression, urine culture today   Meds: No orders of the defined types were placed in this encounter.  Labs/procedures today: Rhogam; urine culture  Plan:  Continue routine obstetrical care   Reviewed: Preterm labor symptoms and general obstetric precautions including but not limited to vaginal bleeding, contractions, leaking of fluid and fetal movement were reviewed in detail with the patient.  All questions were answered. Didn't ask about home bp cuff. Check bp weekly, let us know if >140/90.   Follow-up: Return for ASAP- PN2 with 28 Brach's jelly beans (keep all other appts as scheduled).  Orders Placed This Encounter  Procedures   RHO (D) Immune Globulin   Arabella Merles [facility] 01/20/2023 4:14 PM

## 2023-01-21 ENCOUNTER — Other Ambulatory Visit: Payer: Medicaid Other

## 2023-01-22 LAB — URINE CULTURE

## 2023-02-03 ENCOUNTER — Ambulatory Visit (INDEPENDENT_AMBULATORY_CARE_PROVIDER_SITE_OTHER): Payer: Medicaid Other | Admitting: Advanced Practice Midwife

## 2023-02-03 ENCOUNTER — Encounter: Payer: Self-pay | Admitting: Advanced Practice Midwife

## 2023-02-03 VITALS — BP 108/63 | HR 98 | Wt 138.0 lb

## 2023-02-03 DIAGNOSIS — Z3A34 34 weeks gestation of pregnancy: Secondary | ICD-10-CM

## 2023-02-03 DIAGNOSIS — Z8759 Personal history of other complications of pregnancy, childbirth and the puerperium: Secondary | ICD-10-CM

## 2023-02-03 NOTE — Progress Notes (Signed)
   LOW-RISK PREGNANCY VISIT Patient name: April Young MRN [id number]  Date of birth: September 03, 1996 Chief Complaint:   Routine Prenatal Visit (Thinks she may have lost mucous plug)  History of Present Illness:   April Young is a 27 y.o. G96P1001 female at [redacted]w[redacted]d with an Estimated Date of Delivery: 03/16/23 being seen today for ongoing management of a low-risk pregnancy.  Today she reports  may have lost mucous plug the other night; no reg ctx . Contractions: Irritability. Vag. Bleeding: None.  Movement: Present. denies leaking of fluid. Review of Systems:   Pertinent items are noted in HPI Denies abnormal vaginal discharge w/ itching/odor/irritation, headaches, visual changes, shortness of breath, chest pain, abdominal pain, severe nausea/vomiting, or problems with urination or bowel movements unless otherwise stated above. Pertinent History Reviewed:  Reviewed past medical,surgical, social, obstetrical and family history.  Reviewed problem list, medications and allergies. Physical Assessment:   Vitals:   02/03/23 1623  BP: 108/63  Pulse: 98  Weight: 138 lb (62.6 kg)  Body mass index is 20.98 kg/m.        Physical Examination:   General appearance: Well appearing, and in no distress  Mental status: Alert, oriented to person, place, and time  Skin: Warm & dry  Cardiovascular: Normal heart rate noted  Respiratory: Normal respiratory effort, no distress  Abdomen: Soft, gravid, nontender  Pelvic: Cervical exam deferred         Extremities: Edema: None  Fetal Status: Fetal Heart Rate (bpm): 129 Fundal Height: 33 cm Movement: Present    No results found for this or any previous visit (from the past 24 hour(s)).  Assessment & Plan:  1) Low-risk pregnancy G2P1001 at [redacted]w[redacted]d with an Estimated Date of Delivery: 03/16/23   2) Needs 2h GTT, wasn't able to find Brach's jelly beans; orange Fanta 20oz has 73g sugar, so will bring one and do labs ASAP  3) Hx FGR, will get EFW with next  visit  4) Anemia, not taking Fe; last Hgb prior to Venofer x 1 was 8.7; will check with PN2   Meds: No orders of the defined types were placed in this encounter.  Labs/procedures today: none  Plan:  Continue routine obstetrical care   Reviewed: Preterm labor symptoms and general obstetric precautions including but not limited to vaginal bleeding, contractions, leaking of fluid and fetal movement were reviewed in detail with the patient.  All questions were answered. Has home bp cuff. Check bp weekly, let us know if >140/90.   Follow-up: Return for As scheduled; add in PN2 ASAP- she will bring her own 20oz Fanta (73g sugar).  No orders of the defined types were placed in this encounter.  Arabella Merles CNM 02/03/2023 4:40 PM

## 2023-02-03 NOTE — Patient Instructions (Addendum)
April Young, I greatly value your feedback.  If you receive a survey following your visit with [place] today, we appreciate you taking the time to fill it out.  Thanks, Philipp Deputy, CNM   You will have your sugar test next visit.  Please do not eat or drink anything after midnight the night before you come, not even water.  You will be here for at least two hours.  Please make an appointment online for the bloodwork at [email or web address] for 8:30am (or as close to this as possible). Make sure you select the [facility] service center. The day of the appointment, check in with our office first, then you will go to [facility] to start the sugar test.    [facility] HAS MOVED!!! It is now [facility] ([address] [place], [place] [address]) Entrance C, located off of [address] valet parking  Go to [email or web address] to register for FREE online childbirth classes   Call the office [phone]) or go to [facility] if: You begin to have strong, frequent contractions Your water breaks.  Sometimes it is a big gush of fluid, sometimes it is just a trickle that keeps getting your panties wet or running down your legs You have vaginal bleeding.  It is normal to have a small amount of spotting if your cervix was checked.  You don't feel your baby moving like normal.  If you don't, get you something to eat and drink and lay down and focus on feeling your baby move.   If your baby is still not moving like normal, you should call the office or go to [facility].  [place] Pediatricians/Family Doctors: [facility] [phone]           [facility] [phone]                [facility] [phone] (usually not accepting new patients unless you have family there already, you are always welcome to call and ask)      [facility] Department [phone]       [facility] Pediatricians/Family Doctors:  [facility]: [phone] [facility]: [phone] [facility]: [phone]  [facility] Doctors:  [facility]: [phone]  [facility]: [phone]  [facility] Doctors: [facility]: [phone]   Home Blood Pressure Monitoring for Patients   Your provider has recommended that you check your blood pressure (BP) at least once a week at home. If you do not have a blood pressure cuff at home, one will be provided for you. Contact your provider if you have not received your monitor within 1 week.   Helpful Tips for Accurate Home Blood Pressure Checks  Don't smoke, exercise, or drink caffeine 30 minutes before checking your BP Use the restroom before checking your BP (a full bladder can raise your pressure) Relax in a comfortable upright chair Feet on the ground Left arm resting comfortably on a flat surface at the level of your heart Legs uncrossed Back supported Sit quietly and don't talk Place the cuff on your bare arm Adjust snuggly, so that only two fingertips can fit between your skin and the top of the cuff Check 2 readings separated by at least one minute Keep a log of your BP readings For a visual, please reference this diagram: http://ccnc.care/bpdiagram  Provider Name: [facility]     Phone: [phone]  Zone 1: ALL CLEAR  Continue to monitor your symptoms:  BP reading is less than 140 (top number) or less than 90 (bottom number)  No right upper stomach pain No headaches or seeing spots No feeling nauseated or throwing up No swelling in face and hands  Zone 2: CAUTION Call your doctor's office for any of the following:  BP reading is greater than 140 (top number) or greater than 90 (bottom number)  Stomach pain under your ribs in the middle or right side Headaches or seeing spots Feeling nauseated or throwing up Swelling in face and hands  Zone 3: EMERGENCY   Seek immediate medical care if you have any of the following:  BP reading is greater than160 (top number) or greater than 110 (bottom number) Severe headaches not improving with Tylenol Serious difficulty catching your breath Any worsening symptoms from Zone 2   Second Trimester of Pregnancy The second trimester is from week 13 through week 28, months 4 through 6. The second trimester is often a time when you feel your best. Your body has also adjusted to being pregnant, and you begin to feel better physically. Usually, morning sickness has lessened or quit completely, you may have more energy, and you may have an increase in appetite. The second trimester is also a time when the fetus is growing rapidly. At the end of the sixth month, the fetus is about 9 inches long and weighs about 1 pounds. You will likely begin to feel the baby move (quickening) between 18 and 20 weeks of the pregnancy. BODY CHANGES Your body goes through many changes during pregnancy. The changes vary from woman to woman.  Your weight will continue to increase. You will notice your lower abdomen bulging out. You may begin to get stretch marks on your hips, abdomen, and breasts. You may develop headaches that can be relieved by medicines approved by your health care provider. You may urinate more often because the fetus is pressing on your bladder. You may develop or continue to have heartburn as a result of your pregnancy. You may develop constipation because certain hormones are causing the muscles that push waste through your intestines to slow down. You may develop hemorrhoids or swollen, bulging veins (varicose veins). You may have back pain because of the weight gain and pregnancy hormones relaxing your joints between the bones in your pelvis and as a result of a shift in weight and the muscles that support your balance. Your breasts will continue to grow and be tender. Your gums may bleed and may be sensitive to  brushing and flossing. Dark spots or blotches (chloasma, mask of pregnancy) may develop on your face. This will likely fade after the baby is born. A dark line from your belly button to the pubic area (linea nigra) may appear. This will likely fade after the baby is born. You may have changes in your hair. These can include thickening of your hair, rapid growth, and changes in texture. Some women also have hair loss during or after pregnancy, or hair that feels dry or thin. Your hair will most likely return to normal after your baby is born. WHAT TO EXPECT AT YOUR PRENATAL VISITS During a routine prenatal visit: You will be weighed to make sure you and the fetus are growing normally. Your blood pressure will be taken. Your abdomen will be measured to track your baby's growth. The fetal heartbeat will be listened to. Any test results from the previous visit will be discussed. Your health care provider  may ask you: How you are feeling. If you are feeling the baby move. If you have had any abnormal symptoms, such as leaking fluid, bleeding, severe headaches, or abdominal cramping. If you have any questions. Other tests that may be performed during your second trimester include: Blood tests that check for: Low iron levels (anemia). Gestational diabetes (between 24 and 28 weeks). Rh antibodies. Urine tests to check for infections, diabetes, or protein in the urine. An ultrasound to confirm the proper growth and development of the baby. An amniocentesis to check for possible genetic problems. Fetal screens for spina bifida and Down syndrome. HOME CARE INSTRUCTIONS  Avoid all smoking, herbs, alcohol, and unprescribed drugs. These chemicals affect the formation and growth of the baby. Follow your health care provider's instructions regarding medicine use. There are medicines that are either safe or unsafe to take during pregnancy. Exercise only as directed by your health care provider.  Experiencing uterine cramps is a good sign to stop exercising. Continue to eat regular, healthy meals. Wear a good support bra for breast tenderness. Do not use hot tubs, steam rooms, or saunas. Wear your seat belt at all times when driving. Avoid raw meat, uncooked cheese, cat litter boxes, and soil used by cats. These carry germs that can cause birth defects in the baby. Take your prenatal vitamins. Try taking a stool softener (if your health care provider approves) if you develop constipation. Eat more high-fiber foods, such as fresh vegetables or fruit and whole grains. Drink plenty of fluids to keep your urine clear or pale yellow. Take warm sitz baths to soothe any pain or discomfort caused by hemorrhoids. Use hemorrhoid cream if your health care provider approves. If you develop varicose veins, wear support hose. Elevate your feet for 15 minutes, 3-4 times a day. Limit salt in your diet. Avoid heavy lifting, wear low heel shoes, and practice good posture. Rest with your legs elevated if you have leg cramps or low back pain. Visit your dentist if you have not gone yet during your pregnancy. Use a soft toothbrush to brush your teeth and be gentle when you floss. A sexual relationship may be continued unless your health care provider directs you otherwise. Continue to go to all your prenatal visits as directed by your health care provider. SEEK MEDICAL CARE IF:  You have dizziness. You have mild pelvic cramps, pelvic pressure, or nagging pain in the abdominal area. You have persistent nausea, vomiting, or diarrhea. You have a bad smelling vaginal discharge. You have pain with urination. SEEK IMMEDIATE MEDICAL CARE IF:  You have a fever. You are leaking fluid from your vagina. You have spotting or bleeding from your vagina. You have severe abdominal cramping or pain. You have rapid weight gain or loss. You have shortness of breath with chest pain. You notice sudden or extreme swelling  of your face, hands, ankles, feet, or legs. You have not felt your baby move in over an hour. You have severe headaches that do not go away with medicine. You have vision changes. Document Released: 09/22/2001 Document Revised: 10/03/2013 Document Reviewed: 11/29/2012 [facility] Patient Information 2015 [place], [place]. This information is not intended to replace advice given to you by your health care provider. Make sure you discuss any questions you have with your health care provider.

## 2023-02-05 ENCOUNTER — Other Ambulatory Visit: Payer: Medicaid Other

## 2023-02-15 ENCOUNTER — Other Ambulatory Visit: Payer: Self-pay | Admitting: Women's Health

## 2023-02-17 ENCOUNTER — Ambulatory Visit (INDEPENDENT_AMBULATORY_CARE_PROVIDER_SITE_OTHER): Payer: Medicaid Other | Admitting: Advanced Practice Midwife

## 2023-02-17 ENCOUNTER — Ambulatory Visit (INDEPENDENT_AMBULATORY_CARE_PROVIDER_SITE_OTHER): Payer: Medicaid Other

## 2023-02-17 ENCOUNTER — Other Ambulatory Visit (HOSPITAL_COMMUNITY)
Admission: RE | Admit: 2023-02-17 | Discharge: 2023-02-17 | Disposition: A | Discharge status: Home / Self Care | Payer: Medicaid Other | Source: Ambulatory Visit | Attending: Advanced Practice Midwife | Admitting: Advanced Practice Midwife

## 2023-02-17 VITALS — BP 111/67 | HR 76 | Wt 139.0 lb

## 2023-02-17 DIAGNOSIS — Z8759 Personal history of other complications of pregnancy, childbirth and the puerperium: Secondary | ICD-10-CM | POA: Diagnosis not present

## 2023-02-17 DIAGNOSIS — Z3483 Encounter for supervision of other normal pregnancy, third trimester: Secondary | ICD-10-CM

## 2023-02-17 DIAGNOSIS — Z3A36 36 weeks gestation of pregnancy: Secondary | ICD-10-CM

## 2023-02-17 DIAGNOSIS — R8271 Bacteriuria: Secondary | ICD-10-CM

## 2023-02-17 DIAGNOSIS — Z3480 Encounter for supervision of other normal pregnancy, unspecified trimester: Secondary | ICD-10-CM

## 2023-02-17 DIAGNOSIS — O26899 Other specified pregnancy related conditions, unspecified trimester: Secondary | ICD-10-CM

## 2023-02-17 DIAGNOSIS — Z302 Encounter for sterilization: Secondary | ICD-10-CM

## 2023-02-17 NOTE — Progress Notes (Signed)
   LOW-RISK PREGNANCY VISIT Patient name: April Young MRN [id number]  Date of birth: 1996-03-06 Chief Complaint:   Routine Prenatal Visit  History of Present Illness:   April Young is a 27 y.o. G65P1001 female at [redacted]w[redacted]d with an Estimated Date of Delivery: 03/16/23 being seen today for ongoing management of a low-risk pregnancy.  Today she reports no complaints. Contractions: Irritability. Vag. Bleeding: None.  Movement: Present. denies leaking of fluid. Review of Systems:   Pertinent items are noted in HPI Denies abnormal vaginal discharge w/ itching/odor/irritation, headaches, visual changes, shortness of breath, chest pain, abdominal pain, severe nausea/vomiting, or problems with urination or bowel movements unless otherwise stated above. Pertinent History Reviewed:  Reviewed past medical,surgical, social, obstetrical and family history.  Reviewed problem list, medications and allergies. Physical Assessment:   Vitals:   02/17/23 1534  BP: 111/67  Pulse: 76  Weight: 139 lb (63 kg)  Body mass index is 21.13 kg/m.        Physical Examination:   General appearance: Well appearing, and in no distress  Mental status: Alert, oriented to person, place, and time  Skin: Warm & dry  Cardiovascular: Normal heart rate noted  Respiratory: Normal respiratory effort, no distress  Abdomen: Soft, gravid, nontender  Pelvic: Cervical exam performed  Dilation: 2 Effacement (%): 50 Station: -2  Extremities:    Fetal Status: Fetal Heart Rate (bpm): 140 u/s   Movement: Present Presentation: Vertex  Growth u/s: [place] 36+1 wks,cephalic,anterior placenta gr 3,AFI 15 cm,FHR 140 bpm,normal right ovary,simple left adnexal cyst(limited view) 4.3 x 2.2 x 3.5 cm,EFW 3224 g 84%   No results found for this or any previous visit (from the past 24 hour(s)).  Assessment & Plan:  1) Low-risk pregnancy G2P1001 at [redacted]w[redacted]d with an Estimated Date of Delivery: 03/16/23   2) Hx FGR prev preg, EFW 84% today  3)  Declines GTT, is busy in the mornings and can't make it  4) Bilat RPD and LVIECF, both resolved today   Meds: No orders of the defined types were placed in this encounter.  Labs/procedures today: growth u/s; GBS w sensitivities; cultures; SVE  Plan:  Continue routine obstetrical care   Reviewed: Term labor symptoms and general obstetric precautions including but not limited to vaginal bleeding, contractions, leaking of fluid and fetal movement were reviewed in detail with the patient.  All questions were answered. Didn't ask about home bp cuff. Check bp weekly, let us know if >140/90.   Follow-up: Return for As scheduled.  Orders Placed This Encounter  Procedures   Strep Gp B NAA+Rflx   Arabella Merles [facility] 02/17/2023 4:10 PM

## 2023-02-17 NOTE — Progress Notes (Signed)
[place] 36+1 wks,cephalic,anterior placenta gr 3,AFI 15 cm,FHR 140 bpm,normal right ovary,simple left adnexal cyst(limited view) 4.3 x 2.2 x 3.5 cm,EFW 3224 g 84%

## 2023-02-19 LAB — CERVICOVAGINAL ANCILLARY ONLY
Chlamydia: NEGATIVE
Comment: NEGATIVE
Comment: NORMAL
Neisseria Gonorrhea: NEGATIVE

## 2023-02-19 LAB — STREP GP B NAA+RFLX: Strep Gp B NAA+Rflx: NEGATIVE

## 2023-02-24 ENCOUNTER — Encounter: Payer: Self-pay | Admitting: Advanced Practice Midwife

## 2023-02-24 ENCOUNTER — Ambulatory Visit (INDEPENDENT_AMBULATORY_CARE_PROVIDER_SITE_OTHER): Payer: Medicaid Other | Admitting: Advanced Practice Midwife

## 2023-02-24 VITALS — BP 99/56 | HR 89 | Wt 145.0 lb

## 2023-02-24 DIAGNOSIS — Z3A37 37 weeks gestation of pregnancy: Secondary | ICD-10-CM

## 2023-02-24 DIAGNOSIS — Z3480 Encounter for supervision of other normal pregnancy, unspecified trimester: Secondary | ICD-10-CM

## 2023-02-24 NOTE — Progress Notes (Signed)
   LOW-RISK PREGNANCY VISIT Patient name: April Young MRN [id number]  Date of birth: Sep 26, 1996 Chief Complaint:   Routine Prenatal Visit  History of Present Illness:   April Young is a 27 y.o. G47P1001 female at [redacted]w[redacted]d with an Estimated Date of Delivery: 03/16/23 being seen today for ongoing management of a low-risk pregnancy.  Today she reports no complaints. Contractions: Irregular.  .  Movement: Present. denies leaking of fluid. Review of Systems:   Pertinent items are noted in HPI Denies abnormal vaginal discharge w/ itching/odor/irritation, headaches, visual changes, shortness of breath, chest pain, abdominal pain, severe nausea/vomiting, or problems with urination or bowel movements unless otherwise stated above. Pertinent History Reviewed:  Reviewed past medical,surgical, social, obstetrical and family history.  Reviewed problem list, medications and allergies. Physical Assessment:   Vitals:   02/24/23 1602  BP: (!) 99/56  Pulse: 89  Weight: 145 lb (65.8 kg)  Body mass index is 22.05 kg/m.        Physical Examination:   General appearance: Well appearing, and in no distress  Mental status: Alert, oriented to person, place, and time  Skin: Warm & dry  Cardiovascular: Normal heart rate noted  Respiratory: Normal respiratory effort, no distress  Abdomen: Soft, gravid, nontender  Pelvic: Cervical exam deferred         Extremities: Edema: None  Fetal Status: Fetal Heart Rate (bpm): 125   Movement: Present    No results found for this or any previous visit (from the past 24 hour(s)).  Assessment & Plan:  1) Low-risk pregnancy G2P1001 at [redacted]w[redacted]d with an Estimated Date of Delivery: 03/16/23   2) Anemia, last Hgb 8.7>Venofer x 1; will check on admission   Meds: No orders of the defined types were placed in this encounter.  Labs/procedures today: none  Plan:  Continue routine obstetrical care   Reviewed: Term labor symptoms and general obstetric precautions including  but not limited to vaginal bleeding, contractions, leaking of fluid and fetal movement were reviewed in detail with the patient.  All questions were answered. Has home bp cuff. Check bp weekly, let us know if >140/90.   Follow-up: Return for As scheduled.  No orders of the defined types were placed in this encounter.  Arabella Merles CNM 02/24/2023 4:30 PM

## 2023-03-03 ENCOUNTER — Encounter: Payer: Self-pay | Admitting: Advanced Practice Midwife

## 2023-03-03 ENCOUNTER — Ambulatory Visit (INDEPENDENT_AMBULATORY_CARE_PROVIDER_SITE_OTHER): Payer: Medicaid Other | Admitting: Advanced Practice Midwife

## 2023-03-03 VITALS — BP 121/68 | HR 92 | Wt 147.0 lb

## 2023-03-03 DIAGNOSIS — Z3A38 38 weeks gestation of pregnancy: Secondary | ICD-10-CM

## 2023-03-03 DIAGNOSIS — Z3483 Encounter for supervision of other normal pregnancy, third trimester: Secondary | ICD-10-CM

## 2023-03-03 DIAGNOSIS — Z3480 Encounter for supervision of other normal pregnancy, unspecified trimester: Secondary | ICD-10-CM

## 2023-03-03 NOTE — Progress Notes (Signed)
   LOW-RISK PREGNANCY VISIT Patient name: April Young MRN [id number]  Date of birth: 20-Aug-1996 Chief Complaint:   Routine Prenatal Visit  History of Present Illness:   April Young is a 27 y.o. G68P1001 female at [redacted]w[redacted]d with an Estimated Date of Delivery: 03/16/23 being seen today for ongoing management of a low-risk pregnancy.  Today she reports no complaints. Contractions: Not present.  .  Movement: Present. denies leaking of fluid. Review of Systems:   Pertinent items are noted in HPI Denies abnormal vaginal discharge w/ itching/odor/irritation, headaches, visual changes, shortness of breath, chest pain, abdominal pain, severe nausea/vomiting, or problems with urination or bowel movements unless otherwise stated above. Pertinent History Reviewed:  Reviewed past medical,surgical, social, obstetrical and family history.  Reviewed problem list, medications and allergies. Physical Assessment:   Vitals:   03/03/23 1617  BP: 121/68  Pulse: 92  Weight: 147 lb (66.7 kg)  Body mass index is 22.35 kg/m.        Physical Examination:   General appearance: Well appearing, and in no distress  Mental status: Alert, oriented to person, place, and time  Skin: Warm & dry  Cardiovascular: Normal heart rate noted  Respiratory: Normal respiratory effort, no distress  Abdomen: Soft, gravid, nontender  Pelvic: Cervical exam deferred         Extremities: Edema: None  Fetal Status: Fetal Heart Rate (bpm): 147 Fundal Height: 38 cm Movement: Present Presentation: Vertex  No results found for this or any previous visit (from the past 24 hour(s)).  Assessment & Plan:  1) Low-risk pregnancy G2P1001 at [redacted]w[redacted]d with an Estimated Date of Delivery: 03/16/23   2) Wants membrane sweep next week   Meds: No orders of the defined types were placed in this encounter.  Labs/procedures today: none  Plan:  Continue routine obstetrical care   Reviewed: Term labor symptoms and general obstetric precautions  including but not limited to vaginal bleeding, contractions, leaking of fluid and fetal movement were reviewed in detail with the patient.  All questions were answered. Has home bp cuff.  Check bp weekly, let us know if >140/90.   Follow-up: Return for As scheduled.  No orders of the defined types were placed in this encounter.  Arabella Merles CNM 03/03/2023 4:35 PM

## 2023-03-10 ENCOUNTER — Ambulatory Visit (INDEPENDENT_AMBULATORY_CARE_PROVIDER_SITE_OTHER): Payer: Medicaid Other | Admitting: Advanced Practice Midwife

## 2023-03-10 ENCOUNTER — Encounter: Payer: Self-pay | Admitting: Advanced Practice Midwife

## 2023-03-10 VITALS — BP 102/62 | HR 84 | Wt 143.3 lb

## 2023-03-10 DIAGNOSIS — Z3483 Encounter for supervision of other normal pregnancy, third trimester: Secondary | ICD-10-CM

## 2023-03-10 DIAGNOSIS — Z3480 Encounter for supervision of other normal pregnancy, unspecified trimester: Secondary | ICD-10-CM

## 2023-03-10 DIAGNOSIS — Z3A39 39 weeks gestation of pregnancy: Secondary | ICD-10-CM

## 2023-03-10 NOTE — Progress Notes (Signed)
   LOW-RISK PREGNANCY VISIT Patient name: April Young MRN [id number]  Date of birth: 03-09-1996 Chief Complaint:   Routine Prenatal Visit (Cervical check)  History of Present Illness:   April Young is a 27 y.o. G12P1001 female at [redacted]w[redacted]d with an Estimated Date of Delivery: 03/16/23 being seen today for ongoing management of a low-risk pregnancy.  Today she reports no complaints. Contractions: Irregular. Vag. Bleeding: Bloody Show.  Movement: Present. denies leaking of fluid. Review of Systems:   Pertinent items are noted in HPI Denies abnormal vaginal discharge w/ itching/odor/irritation, headaches, visual changes, shortness of breath, chest pain, abdominal pain, severe nausea/vomiting, or problems with urination or bowel movements unless otherwise stated above. Pertinent History Reviewed:  Reviewed past medical,surgical, social, obstetrical and family history.  Reviewed problem list, medications and allergies. Physical Assessment:   Vitals:   03/10/23 1617  BP: 102/62  Pulse: 84  Weight: 143 lb 4.8 oz (65 kg)  Body mass index is 21.79 kg/m.        Physical Examination:   General appearance: Well appearing, and in no distress  Mental status: Alert, oriented to person, place, and time  Skin: Warm & dry  Cardiovascular: Normal heart rate noted  Respiratory: Normal respiratory effort, no distress  Abdomen: Soft, gravid, nontender  Pelvic: Cervical exam performed  Dilation: 4 Effacement (%): 70 Station: -2  Extremities: Edema: Trace  Fetal Status: Fetal Heart Rate (bpm): 150   Movement: Present Presentation: Vertex  No results found for this or any previous visit (from the past 24 hour(s)).  Assessment & Plan:  1) Low-risk pregnancy G2P1001 at [redacted]w[redacted]d with an Estimated Date of Delivery: 03/16/23   2) Requesting membrane sweep, will add NST to next week's visit; discussed PD management if needed   Meds: No orders of the defined types were placed in this  encounter.  Labs/procedures today: SVE/membrane sweep  Plan:  Continue routine obstetrical care   Reviewed: Term labor symptoms and general obstetric precautions including but not limited to vaginal bleeding, contractions, leaking of fluid and fetal movement were reviewed in detail with the patient.  All questions were answered. Didn't ask about home bp cuff. Check bp weekly, let us know if >140/90.   Follow-up: Return for As scheduled (add NST to next visit).  No orders of the defined types were placed in this encounter.  Arabella Merles CNM 03/10/2023 4:35 PM

## 2023-03-16 ENCOUNTER — Inpatient Hospital Stay (HOSPITAL_COMMUNITY)
Admission: AD | Admit: 2023-03-16 | Discharge: 2023-03-18 | DRG: 807 | Disposition: A | Discharge status: Home / Self Care | Payer: Medicaid Other | Attending: Obstetrics and Gynecology | Admitting: Obstetrics and Gynecology

## 2023-03-16 ENCOUNTER — Other Ambulatory Visit: Payer: Self-pay

## 2023-03-16 ENCOUNTER — Inpatient Hospital Stay (HOSPITAL_COMMUNITY): Payer: Medicaid Other | Admitting: Anesthesiology

## 2023-03-16 ENCOUNTER — Encounter (HOSPITAL_COMMUNITY): Payer: Self-pay | Admitting: Obstetrics and Gynecology

## 2023-03-16 DIAGNOSIS — Z5986 Financial insecurity: Secondary | ICD-10-CM | POA: Diagnosis not present

## 2023-03-16 DIAGNOSIS — D500 Iron deficiency anemia secondary to blood loss (chronic): Secondary | ICD-10-CM | POA: Diagnosis not present

## 2023-03-16 DIAGNOSIS — O99019 Anemia complicating pregnancy, unspecified trimester: Secondary | ICD-10-CM | POA: Diagnosis present

## 2023-03-16 DIAGNOSIS — Z880 Allergy status to penicillin: Secondary | ICD-10-CM

## 2023-03-16 DIAGNOSIS — Z8759 Personal history of other complications of pregnancy, childbirth and the puerperium: Secondary | ICD-10-CM

## 2023-03-16 DIAGNOSIS — Z87891 Personal history of nicotine dependence: Secondary | ICD-10-CM | POA: Diagnosis not present

## 2023-03-16 DIAGNOSIS — O9902 Anemia complicating childbirth: Secondary | ICD-10-CM | POA: Diagnosis present

## 2023-03-16 DIAGNOSIS — O26893 Other specified pregnancy related conditions, third trimester: Principal | ICD-10-CM | POA: Diagnosis present

## 2023-03-16 DIAGNOSIS — Z3480 Encounter for supervision of other normal pregnancy, unspecified trimester: Principal | ICD-10-CM

## 2023-03-16 DIAGNOSIS — Z23 Encounter for immunization: Secondary | ICD-10-CM | POA: Diagnosis not present

## 2023-03-16 DIAGNOSIS — Z6791 Unspecified blood type, Rh negative: Secondary | ICD-10-CM

## 2023-03-16 DIAGNOSIS — Z3A40 40 weeks gestation of pregnancy: Secondary | ICD-10-CM | POA: Diagnosis not present

## 2023-03-16 DIAGNOSIS — Z5982 Transportation insecurity: Secondary | ICD-10-CM | POA: Diagnosis not present

## 2023-03-16 DIAGNOSIS — Z3490 Encounter for supervision of normal pregnancy, unspecified, unspecified trimester: Secondary | ICD-10-CM

## 2023-03-16 DIAGNOSIS — O479 False labor, unspecified: Secondary | ICD-10-CM | POA: Diagnosis present

## 2023-03-16 DIAGNOSIS — R8271 Bacteriuria: Secondary | ICD-10-CM

## 2023-03-16 DIAGNOSIS — D649 Anemia, unspecified: Secondary | ICD-10-CM | POA: Diagnosis not present

## 2023-03-16 DIAGNOSIS — O26899 Other specified pregnancy related conditions, unspecified trimester: Secondary | ICD-10-CM

## 2023-03-16 DIAGNOSIS — O480 Post-term pregnancy: Secondary | ICD-10-CM | POA: Diagnosis not present

## 2023-03-16 DIAGNOSIS — R760 Raised antibody titer: Secondary | ICD-10-CM | POA: Diagnosis present

## 2023-03-16 DIAGNOSIS — Z302 Encounter for sterilization: Secondary | ICD-10-CM

## 2023-03-16 LAB — TYPE AND SCREEN
ABO/RH(D): A NEG
Unit division: 0

## 2023-03-16 LAB — CBC
HCT: 31.6 % — ABNORMAL LOW (ref 36.0–46.0)
Hemoglobin: 10 g/dL — ABNORMAL LOW (ref 12.0–15.0)
MCH: 26.7 pg (ref 26.0–34.0)
MCHC: 31.6 g/dL (ref 30.0–36.0)
MCV: 84.5 fL (ref 80.0–100.0)
Platelets: 163 10*3/uL (ref 150–400)
RBC: 3.74 MIL/uL — ABNORMAL LOW (ref 3.87–5.11)
RDW: 17.1 % — ABNORMAL HIGH (ref 11.5–15.5)
WBC: 15.8 10*3/uL — ABNORMAL HIGH (ref 4.0–10.5)
nRBC: 0 % (ref 0.0–0.2)

## 2023-03-16 LAB — BPAM RBC
Blood Product Expiration Date: 202406132359
Blood Product Expiration Date: 202406142359

## 2023-03-16 MED ORDER — FENTANYL-BUPIVACAINE-NACL 0.5-0.125-0.9 MG/250ML-% EP SOLN
12.0000 mL/h | EPIDURAL | Status: DC | PRN
  Administered 2023-03-16: 12 mL/h via EPIDURAL
  Filled 2023-03-16: qty 250

## 2023-03-16 MED ORDER — FENTANYL CITRATE (PF) 100 MCG/2ML IJ SOLN
100.0000 ug | INTRAMUSCULAR | Status: DC | PRN
  Administered 2023-03-16: 100 ug via INTRAVENOUS
  Filled 2023-03-16: qty 2

## 2023-03-16 MED ORDER — EPHEDRINE 5 MG/ML INJ: 10.0000 mg | INTRAVENOUS | Status: DC | PRN

## 2023-03-16 MED ORDER — OXYTOCIN-SODIUM CHLORIDE 30-0.9 UT/500ML-% IV SOLN
2.5000 [IU]/h | INTRAVENOUS | Status: DC
  Administered 2023-03-16: 2.5 [IU]/h via INTRAVENOUS
  Filled 2023-03-16: qty 500

## 2023-03-16 MED ORDER — PHENYLEPHRINE 80 MCG/ML (10ML) SYRINGE FOR IV PUSH (FOR BLOOD PRESSURE SUPPORT): 80.0000 ug | PREFILLED_SYRINGE | INTRAVENOUS | Status: DC | PRN

## 2023-03-16 MED ORDER — LACTATED RINGERS IV SOLN
500.0000 mL | INTRAVENOUS | Status: DC | PRN
  Administered 2023-03-16: 1000 mL via INTRAVENOUS

## 2023-03-16 MED ORDER — FENTANYL-BUPIVACAINE-NACL 0.5-0.125-0.9 MG/250ML-% EP SOLN
12.0000 mL/h | EPIDURAL | Status: DC | PRN
Start: 2023-03-16 — End: 2023-03-16

## 2023-03-16 MED ORDER — OXYCODONE-ACETAMINOPHEN 5-325 MG PO TABS: 2.0000 | ORAL_TABLET | ORAL | Status: DC | PRN

## 2023-03-16 MED ORDER — ONDANSETRON HCL 4 MG/2ML IJ SOLN
4.0000 mg | Freq: Four times a day (QID) | INTRAMUSCULAR | Status: DC | PRN
  Administered 2023-03-16: 4 mg via INTRAVENOUS
  Filled 2023-03-16: qty 2

## 2023-03-16 MED ORDER — LIDOCAINE HCL (PF) 1 % IJ SOLN: 30.0000 mL | INTRAMUSCULAR | Status: DC | PRN

## 2023-03-16 MED ORDER — ACETAMINOPHEN 325 MG PO TABS: 650.0000 mg | ORAL_TABLET | ORAL | Status: DC | PRN

## 2023-03-16 MED ORDER — DIPHENHYDRAMINE HCL 50 MG/ML IJ SOLN: 12.5000 mg | INTRAMUSCULAR | Status: DC | PRN

## 2023-03-16 MED ORDER — OXYCODONE-ACETAMINOPHEN 5-325 MG PO TABS: 1.0000 | ORAL_TABLET | ORAL | Status: DC | PRN

## 2023-03-16 MED ORDER — SOD CITRATE-CITRIC ACID 500-334 MG/5ML PO SOLN: 30.0000 mL | ORAL | Status: DC | PRN

## 2023-03-16 MED ORDER — LIDOCAINE HCL (PF) 1 % IJ SOLN
INTRAMUSCULAR | Status: DC | PRN
  Administered 2023-03-16: 8 mL via EPIDURAL

## 2023-03-16 MED ORDER — OXYTOCIN BOLUS FROM INFUSION
333.0000 mL | Freq: Once | INTRAVENOUS | Status: AC
  Administered 2023-03-16: 333 mL via INTRAVENOUS

## 2023-03-16 MED ORDER — LACTATED RINGERS IV SOLN: INTRAVENOUS | Status: DC

## 2023-03-16 MED ORDER — LACTATED RINGERS IV SOLN: 500.0000 mL | Freq: Once | INTRAVENOUS | Status: DC

## 2023-03-16 NOTE — Discharge Summary (Signed)
Postpartum Discharge Summary  Date of Service updated***     Patient Name: April Young DOB: 01-10-1996 MRN: [id number]  Date of admission: 03/16/2023 Delivery date:03/16/2023  Delivering provider:   Date of discharge: 03/16/2023  Admitting diagnosis: Uterine contractions [O47.9] Intrauterine pregnancy: [redacted]w[redacted]d     Secondary diagnosis:  Principal Problem:   Uterine contractions Active Problems:   Anemia during pregnancy   History of prior pregnancy with IUGR newborn   Abnormal antibody titer   Encounter for supervision of normal pregnancy, antepartum   Rh negative state in antepartum period   Request for sterilization  Additional problems: none    Discharge diagnosis: Term Pregnancy Delivered                                              Post partum procedures:{Postpartum procedures:23558} Augmentation: AROM Complications: None  Hospital course: Onset of Labor With Vaginal Delivery      27 y.o. yo G2P1001 at [redacted]w[redacted]d was admitted in Active Labor on 03/16/2023. Labor course was complicated by nothing.   Membrane Rupture Time/Date: 8:29 PM ,03/16/2023   Delivery Method:  Episiotomy:   Lacerations:    Patient had a postpartum course complicated by ***.  She is ambulating, tolerating a regular diet, passing flatus, and urinating well. Patient is discharged home in stable condition on 03/16/23.  Newborn Data: Birth date:03/16/2023  Birth time:11:07 PM  Gender:Female  Living status:  Apgars: ,  Weight:   Magnesium Sulfate received: No BMZ received: No Rhophylac:{Rhophylac received:30440032} MMR:N/A T-DaP:{Tdap:23962} Flu: No Transfusion:{Transfusion received:30440034}  Physical exam  Vitals:   03/16/23 1918 03/16/23 1930 03/16/23 2000 03/16/23 2133  BP: (!) 102/59 99/66 101/70   Pulse: 71 78 81 (!) 118  Resp:      Temp:      TempSrc:      SpO2:       General: {Exam; general:21111117} Lochia: {Desc; appropriate/inappropriate:30686::"appropriate"} Uterine Fundus:  {Desc; firm/soft:30687} Incision: {Exam; incision:21111123} DVT Evaluation: {Exam; dvt:2111122} Labs: Lab Results  Component Value Date   WBC 15.8 (H) 03/16/2023   HGB 10.0 (L) 03/16/2023   HCT 31.6 (L) 03/16/2023   MCV 84.5 03/16/2023   PLT 163 03/16/2023      Latest Ref Rng & Units 01/07/2023    9:03 PM  CMP  Glucose 70 - 99 mg/dL 96   BUN 6 - 20 mg/dL 8   Creatinine 4.09 - 8.11 mg/dL 9.14   Sodium 782 - 956 mmol/L 135   Potassium 3.5 - 5.1 mmol/L 3.1   Chloride 98 - 111 mmol/L 103   CO2 22 - 32 mmol/L 22   Calcium 8.9 - 10.3 mg/dL 8.8    Edinburgh Score:    08/09/2017    1:41 PM  Edinburgh Postnatal Depression Scale Screening Tool  I have been able to laugh and see the funny side of things. 0  I have looked forward with enjoyment to things. 0  I have blamed myself unnecessarily when things went wrong. 0  I have been anxious or worried for no good reason. 0  I have felt scared or panicky for no good reason. 0  Things have been getting on top of me. 0  I have been so unhappy that I have had difficulty sleeping. 0  I have felt sad or miserable. 0  I have been so unhappy that I have been  crying. 0  The thought of harming myself has occurred to me. 0  Edinburgh Postnatal Depression Scale Total 0     After visit meds:  Allergies as of 03/16/2023       Reactions   Penicillins Rash, Other (See Comments)   Has patient had a PCN reaction causing immediate rash, facial/tongue/throat swelling, SOB or lightheadedness with hypotension: No Has patient had a PCN reaction causing severe rash involving mucus membranes or skin necrosis: No Has patient had a PCN reaction that required hospitalization: No Has patient had a PCN reaction occurring within the last 10 years: No If all of the above answers are "NO", then may proceed with Cephalosporin use.     Med Rec must be completed prior to using this [facility]***        Discharge home in stable condition Infant Feeding:  {Baby feeding:23562} Infant Disposition:{CHL IP OB HOME WITH ZOXWRU:04540} Discharge instruction: per After Visit Summary and Postpartum booklet. Activity: Advance as tolerated. Pelvic rest for 6 weeks.  Diet: {OB JWJX:[id number]} Future Appointments: Future Appointments  Date Time Provider Department Center  03/17/2023  3:30 PM [facility] NURSE [facility]  03/17/2023  4:10 PM Myna Hidalgo, DO [facility]   Follow up Visit: Cheral Marker, CNM  P Ft Admin Pool Please schedule this patient for PP visit in: 4-6wks Low risk pregnancy complicated by: nothing Delivery mode:  SVD Anticipated Birth Control:  plans inpatient BTL PP Procedures needed: none Schedule Integrated BH visit: no Provider: Any provider  03/16/2023 Cheral Marker, CNM

## 2023-03-16 NOTE — H&P (Signed)
OBSTETRIC ADMISSION HISTORY AND PHYSICAL  April Young is a 27 y.o. female G2P1001 with IUP at [redacted]w[redacted]d by LMP presenting for contractions. She reports +FMs, No LOF, no VB, no blurry vision, headaches or peripheral edema, and RUQ pain.  She plans on breastfeeding. She request BTL for birth control. She received her prenatal care at [facility]   Dating: By LMP/6 wk [place] --->  Estimated Date of Delivery: 03/16/23  Sono:   @[redacted]w[redacted]d , CWD, normal anatomy, Vertex  3224g, 84% EFW   Prenatal History/Complications:  Patient Active Problem List   Diagnosis Date Noted   Iron deficiency anemia due to chronic blood loss 12/31/2022   Request for sterilization 10/07/2022   Abnormal genetic test 09/21/2022   ASB (asymptomatic bacteriuria) 09/10/2022   Encounter for supervision of normal pregnancy, antepartum 09/07/2022   Rh negative state in antepartum period 09/07/2022   Abnormal antibody titer 07/28/2022   Family history of neoplasm of breast 01/28/2022   History of prior pregnancy with IUGR newborn 04/15/2017   Anemia during pregnancy 03/15/2017     Past Medical History: Past Medical History:  Diagnosis Date   Abdominal pain affecting pregnancy 03/10/2017   Abnormal antibody titer 07/28/2022   07/28/22 Anti C anti D titer was 4    Candida vaginitis 03/10/2017   Hx of trichomoniasis 11/2016   Rh negative status during pregnancy    Sepsis due to Escherichia coli (HCC) 03/2017   Urinary tract infection 03/10/2017    Past Surgical History: Past Surgical History:  Procedure Laterality Date   SKIN SURGERY     abcess    Obstetrical History: OB History     Gravida  2   Para  1   Term  1   Preterm  0   AB  0   Living  1      SAB  0   IAB      Ectopic      Multiple  0   Live Births  1           Social History Social History   Socioeconomic History   Marital status: Significant Other    Spouse name: Not on file   Number of children: 1   Years of education:  Not on file   Highest education level: Not on file  Occupational History   Not on file  Tobacco Use   Smoking status: Former    Types: E-cigarettes   Smokeless tobacco: Never  Vaping Use   Vaping Use: Former  Substance and Sexual Activity   Alcohol use: No   Drug use: Not Currently    Types: Marijuana    Comment: "every now and then"2 weeks ago last use   Sexual activity: Yes    Birth control/protection: None  Other Topics Concern   Not on file  Social History Narrative   Not on file   Social Determinants of Health   Financial Resource Strain: High Risk (09/07/2022)   Overall Financial Resource Strain (CARDIA)    Difficulty of Paying Living Expenses: Very hard  Food Insecurity: Food Insecurity Present (09/07/2022)   Hunger Vital Sign    Worried About Running Out of Food in the Last Year: Sometimes true    Ran Out of Food in the Last Year: Sometimes true  Transportation Needs: Unmet Transportation Needs (01/28/2022)   PRAPARE - Transportation    Lack of Transportation (Medical): Yes    Lack of Transportation (Non-Medical): Yes  Physical Activity: Insufficiently Active (09/07/2022)  Exercise Vital Sign    Days of Exercise per Week: 2 days    Minutes of Exercise per Session: 10 min  Stress: No Stress Concern Present (09/07/2022)   [employer] of Occupational Health - Occupational Stress Questionnaire    Feeling of Stress : Only a little  Social Connections: Socially Isolated (09/07/2022)   Social Connection and Isolation Panel [NHANES]    Frequency of Communication with Friends and Family: Three times a week    Frequency of Social Gatherings with Friends and Family: Once a week    Attends Religious Services: Never    Database administrator or Organizations: No    Attends Engineer, structural: Never    Marital Status: Never married    Family History: Family History  Problem Relation Age of Onset   COPD Maternal Grandmother     Allergies: Allergies   Allergen Reactions   Penicillins Rash and Other (See Comments)    Has patient had a PCN reaction causing immediate rash, facial/tongue/throat swelling, SOB or lightheadedness with hypotension: No Has patient had a PCN reaction causing severe rash involving mucus membranes or skin necrosis: No Has patient had a PCN reaction that required hospitalization: No Has patient had a PCN reaction occurring within the last 10 years: No If all of the above answers are "NO", then may proceed with Cephalosporin use.    Medications Prior to Admission  Medication Sig Dispense Refill Last Dose   Blood Pressure Monitor MISC For regular home bp monitoring during pregnancy 1 each 0    famotidine (PEPCID) 20 MG tablet TAKE (1) TABLET BY MOUTH TWICE DAILY. 30 tablet 0    Iron Glycinate 29 MG CAPS Take 1 capsule by mouth every other day. (Patient not taking: Reported on 01/20/2023) 15 capsule 5    nitrofurantoin, macrocrystal-monohydrate, (MACROBID) 100 MG capsule Take 1 capsule (100 mg total) by mouth at bedtime. (Patient not taking: Reported on 01/07/2023) 30 capsule 5      Review of Systems   All systems reviewed and negative except as stated in HPI  Blood pressure 100/70, pulse 96, temperature 98.1 F (36.7 C), temperature source Oral, resp. rate 20, last menstrual period 01/05/2022, SpO2 98 %. General appearance: alert and cooperative Lungs: clear to auscultation bilaterally Heart: regular rate and rhythm Abdomen: soft, non-tender; bowel sounds normal Pelvic: adequate, proven to 3100g Extremities: Homans sign is negative, no sign of DVT DTR's wnl Presentation: cephalic Fetal monitoringBaseline: 140 bpm, Variability: Good {> 6 bpm), Accelerations: Reactive, and Decelerations: Absent Uterine activityFrequency: Every 2-3 minutes Dilation: 5 Effacement (%): 80 Station: 0 Exam by:: Dr. Alvester Morin   Prenatal labs: ABO, Rh: A/Negative/-- (11/27 1604) Antibody: Positive, See Final Results (03/13  0914) Rubella: 22.10 (11/27 1604) RPR: Non Reactive (03/13 0914)  HBsAg: Negative (11/27 1604)  HIV: Non Reactive (03/13 0914)  GBS: --Theda Sers (05/08 1630)  1 hr Glucola -- Declined Genetic screening  LR Female Anatomy US WNL  Prenatal Transfer Tool  Maternal Diabetes: No-- unknown as the patient declined GTT Genetic Screening: Normal Maternal Ultrasounds/Referrals: Normal Fetal Ultrasounds or other Referrals:  None Maternal Substance Abuse:  No Significant Maternal Medications:  None Significant Maternal Lab Results:  Group B Strep negative and Rh negative Number of Prenatal Visits:Less than or equal to 3 verified prenatal visits Other Comments:  None  No results found for this or any previous visit (from the past 24 hour(s)).  Patient Active Problem List   Diagnosis Date Noted   Iron deficiency anemia  due to chronic blood loss 12/31/2022   Request for sterilization 10/07/2022   Abnormal genetic test 09/21/2022   ASB (asymptomatic bacteriuria) 09/10/2022   Encounter for supervision of normal pregnancy, antepartum 09/07/2022   Rh negative state in antepartum period 09/07/2022   Abnormal antibody titer 07/28/2022   Family history of neoplasm of breast 01/28/2022   History of prior pregnancy with IUGR newborn 04/15/2017   Anemia during pregnancy 03/15/2017    Assessment/Plan:  April Young is a 27 y.o. G2P1001 at [redacted]w[redacted]d here for contractions with slow progression  #Labor: Progressing and has changed from 4 to 5 cm here in MAU.  Admit to LD for expectant management. Likely AROM would help labor progress #Pain: Having significant pain. She is currently in MAU and only eligible for IV medications. Will offer. She did not endorse wanting epidural.  #FWB:  Cat 1 #ID:  GBS neg #MOF: breast #MOC: BTL #Circ:  Planning   #Anemia-- baseline HGb was 8.7  and improved with oral but still low.  Did not tolerate Fe infusion Lab Results  Component Value Date   HGB 9.6 (L)  01/07/2023   HGB 8.7 (L) 12/23/2022   HGB 11.7 09/07/2022     Federico Flake, MD  03/16/2023, 4:44 PM

## 2023-03-16 NOTE — Anesthesia Preprocedure Evaluation (Signed)
Anesthesia Evaluation  Patient identified by MRN, date of birth, ID band Patient awake    Reviewed: Allergy & Precautions, H&P , NPO status , Patient's Chart, lab work & pertinent test results  Airway Mallampati: I  TM Distance: >3 FB Neck ROM: Full    Dental no notable dental hx. (+) Teeth Intact, Dental Advisory Given   Pulmonary neg pulmonary ROS, former smoker   Pulmonary exam normal breath sounds clear to auscultation       Cardiovascular Exercise Tolerance: Good negative cardio ROS Normal cardiovascular exam Rhythm:Regular Rate:Normal     Neuro/Psych negative neurological ROS  negative psych ROS   GI/Hepatic negative GI ROS, Neg liver ROS,,,  Endo/Other  negative endocrine ROS    Renal/GU negative Renal ROS  negative genitourinary   Musculoskeletal negative musculoskeletal ROS (+)    Abdominal   Peds negative pediatric ROS (+)  Hematology negative hematology ROS (+) Blood dyscrasia, anemia   Anesthesia Other Findings   Reproductive/Obstetrics negative OB ROS                             Anesthesia Physical Anesthesia Plan  ASA: 2  Anesthesia Plan: Epidural   Post-op Pain Management: Epidural*   Induction:   PONV Risk Score and Plan: 2 and Treatment may vary due to age or medical condition  Airway Management Planned: Natural Airway and Simple Face Mask  Additional Equipment: None  Intra-op Plan:   Post-operative Plan:   Informed Consent: I have reviewed the patients History and Physical, chart, labs and discussed the procedure including the risks, benefits and alternatives for the proposed anesthesia with the patient or authorized representative who has indicated his/her understanding and acceptance.     Dental Advisory Given  Plan Discussed with: Anesthesiologist and CRNA  Anesthesia Plan Comments: (Labs checked- platelets confirmed with RN in room. Fetal heart  tracing, per RN, reported to be stable enough for sitting procedure. Discussed epidural, and patient consents to the procedure:  included risk of possible headache,backache, failed block, allergic reaction, and nerve injury. This patient was asked if she had any questions or concerns before the procedure started.)       Anesthesia Quick Evaluation

## 2023-03-16 NOTE — MAU Note (Signed)
..  April Young is a 27 y.o. at [redacted]w[redacted]d here in MAU reporting: contractions that started this morning, she states that she is unsure on how far apart they are. Denies Vb or LOF. +FM.   Pain score: 7 Vitals:   03/16/23 1414 03/16/23 1417  BP: (!) 80/44 (!) 84/40  Pulse: 89 86  Resp:  20  Temp:  98.1 F (36.7 C)  SpO2:  98%     FHT:125

## 2023-03-16 NOTE — Anesthesia Procedure Notes (Signed)
Epidural Patient location during procedure: OB Start time: 03/16/2023 6:25 PM End time: 03/16/2023 6:30 PM  Staffing Anesthesiologist: Eilene Ghazi, MD  Preanesthetic Checklist Completed: patient identified, IV checked, site marked, risks and benefits discussed, surgical consent, monitors and equipment checked, pre-op evaluation and timeout performed  Epidural Patient position: sitting Prep: DuraPrep and site prepped and draped Patient monitoring: continuous pulse ox and blood pressure Approach: midline Location: L3-L4 Injection technique: LOR air  Needle:  Needle type: Tuohy  Needle gauge: 17 G Needle length: 9 cm and 9 Needle insertion depth: 6 cm Catheter type: closed end flexible Catheter size: 19 Gauge Catheter at skin depth: 12 cm Test dose: negative  Assessment Events: blood not aspirated, no cerebrospinal fluid, injection not painful, no injection resistance, no paresthesia and negative IV test

## 2023-03-16 NOTE — Progress Notes (Signed)
Patient ID: April Young, female   DOB: 05/01/1996, 27 y.o.   MRN: [id number] LABOR NOTE April Young is a 27 y.o. G2P1001 at [redacted]w[redacted]d admitted for active labor  Subjective: no complaints, comfortable with epidural, and would like water broken  Objective: BP 101/70   Pulse 81   Temp 98.1 F (36.7 C) (Oral)   Resp 20   LMP 01/05/2022 (Exact Date)   SpO2 99%  No intake/output data recorded.  FHR baseline 115 bpm, Variability: moderate, Accelerations:present, Decelerations:  Absent Toco: q 2-3 mins   SVE:   Dilation: 6.5 Effacement (%): 80 Station: 0 Exam by:: Joellyn Haff, CNM  AROM mod amt clear fluid  Labs: Lab Results  Component Value Date   WBC 15.8 (H) 03/16/2023   HGB 10.0 (L) 03/16/2023   HCT 31.6 (L) 03/16/2023   MCV 84.5 03/16/2023   PLT 163 03/16/2023    Assessment / Plan: Spontaneous labor, progressing normally, now AROM'd  Labor: active Fetal Wellbeing:  Category I Pain Control:  epidural Pre-eclampsia: N/A I/D:  GBS neg Anticipated MOD: NSVB  Cheral Marker CNM, WHNP-BC 03/16/2023, 8:37 PM

## 2023-03-17 ENCOUNTER — Encounter (HOSPITAL_COMMUNITY)
Admission: AD | Disposition: A | Discharge status: Home / Self Care | Payer: Self-pay | Source: Home / Self Care | Attending: Obstetrics and Gynecology

## 2023-03-17 ENCOUNTER — Other Ambulatory Visit: Payer: Medicaid Other

## 2023-03-17 ENCOUNTER — Encounter: Payer: Medicaid Other | Admitting: Obstetrics & Gynecology

## 2023-03-17 LAB — BPAM RBC: Blood Product Expiration Date: 202407082359

## 2023-03-17 LAB — RPR: RPR Ser Ql: NONREACTIVE

## 2023-03-17 LAB — TYPE AND SCREEN: Unit division: 0

## 2023-03-17 LAB — RH IG WORKUP (INCLUDES ABO/RH)

## 2023-03-17 SURGERY — Surgical Case
Anesthesia: *Unknown

## 2023-03-17 SURGERY — LIGATION, FALLOPIAN TUBE, POSTPARTUM
Anesthesia: Choice | Laterality: Bilateral

## 2023-03-17 MED ORDER — FAMOTIDINE 20 MG PO TABS: 40.0000 mg | ORAL_TABLET | Freq: Once | ORAL | Status: DC

## 2023-03-17 MED ORDER — RHO D IMMUNE GLOBULIN 1500 UNIT/2ML IJ SOSY
300.0000 ug | PREFILLED_SYRINGE | Freq: Once | INTRAMUSCULAR | Status: AC
  Administered 2023-03-17: 300 ug via INTRAVENOUS
  Filled 2023-03-17: qty 2

## 2023-03-17 MED ORDER — METOCLOPRAMIDE HCL 10 MG PO TABS: 10.0000 mg | ORAL_TABLET | Freq: Once | ORAL | Status: DC

## 2023-03-17 MED ORDER — DIPHENHYDRAMINE HCL 25 MG PO CAPS: 25.0000 mg | ORAL_CAPSULE | Freq: Four times a day (QID) | ORAL | Status: DC | PRN

## 2023-03-17 MED ORDER — FLEET ENEMA 7-19 GM/118ML RE ENEM: 1.0000 | ENEMA | Freq: Every day | RECTAL | Status: DC | PRN

## 2023-03-17 MED ORDER — COCONUT OIL OIL: 1.0000 | TOPICAL_OIL | Status: DC | PRN

## 2023-03-17 MED ORDER — BENZOCAINE-MENTHOL 20-0.5 % EX AERO
1.0000 | INHALATION_SPRAY | CUTANEOUS | Status: DC | PRN
  Filled 2023-03-17: qty 56

## 2023-03-17 MED ORDER — TETANUS-DIPHTH-ACELL PERTUSSIS 5-2.5-18.5 LF-MCG/0.5 IM SUSY: 0.5000 mL | PREFILLED_SYRINGE | Freq: Once | INTRAMUSCULAR | Status: DC

## 2023-03-17 MED ORDER — ACETAMINOPHEN 325 MG PO TABS
650.0000 mg | ORAL_TABLET | ORAL | Status: DC | PRN
  Administered 2023-03-18 (×2): 650 mg via ORAL
  Filled 2023-03-17 (×2): qty 2

## 2023-03-17 MED ORDER — PRENATAL MULTIVITAMIN CH
1.0000 | ORAL_TABLET | Freq: Every day | ORAL | Status: DC
  Administered 2023-03-17: 1 via ORAL
  Filled 2023-03-17 (×2): qty 1

## 2023-03-17 MED ORDER — SENNOSIDES-DOCUSATE SODIUM 8.6-50 MG PO TABS
2.0000 | ORAL_TABLET | Freq: Every day | ORAL | Status: DC
  Administered 2023-03-17 – 2023-03-18 (×2): 2 via ORAL
  Filled 2023-03-17 (×2): qty 2

## 2023-03-17 MED ORDER — DIBUCAINE (PERIANAL) 1 % EX OINT: 1.0000 | TOPICAL_OINTMENT | CUTANEOUS | Status: DC | PRN

## 2023-03-17 MED ORDER — IBUPROFEN 600 MG PO TABS
600.0000 mg | ORAL_TABLET | Freq: Four times a day (QID) | ORAL | Status: DC
  Administered 2023-03-17 – 2023-03-18 (×6): 600 mg via ORAL
  Filled 2023-03-17 (×6): qty 1

## 2023-03-17 MED ORDER — SODIUM CHLORIDE 0.9 % IV SOLN: 250.0000 mL | INTRAVENOUS | Status: DC | PRN

## 2023-03-17 MED ORDER — ONDANSETRON HCL 4 MG PO TABS: 4.0000 mg | ORAL_TABLET | ORAL | Status: DC | PRN

## 2023-03-17 MED ORDER — SODIUM CHLORIDE 0.9% FLUSH: 3.0000 mL | INTRAVENOUS | Status: DC | PRN

## 2023-03-17 MED ORDER — MEASLES, MUMPS & RUBELLA VAC IJ SOLR: 0.5000 mL | Freq: Once | INTRAMUSCULAR | Status: DC

## 2023-03-17 MED ORDER — LACTATED RINGERS IV SOLN: INTRAVENOUS | Status: DC

## 2023-03-17 MED ORDER — WITCH HAZEL-GLYCERIN EX PADS: 1.0000 | MEDICATED_PAD | CUTANEOUS | Status: DC | PRN

## 2023-03-17 MED ORDER — ONDANSETRON HCL 4 MG/2ML IJ SOLN: 4.0000 mg | INTRAMUSCULAR | Status: DC | PRN

## 2023-03-17 MED ORDER — ZOLPIDEM TARTRATE 5 MG PO TABS: 5.0000 mg | ORAL_TABLET | Freq: Every evening | ORAL | Status: DC | PRN

## 2023-03-17 MED ORDER — SODIUM CHLORIDE 0.9% FLUSH: 3.0000 mL | Freq: Two times a day (BID) | INTRAVENOUS | Status: DC

## 2023-03-17 MED ORDER — BISACODYL 10 MG RE SUPP: 10.0000 mg | Freq: Every day | RECTAL | Status: DC | PRN

## 2023-03-17 MED ORDER — SIMETHICONE 80 MG PO CHEW: 80.0000 mg | CHEWABLE_TABLET | ORAL | Status: DC | PRN

## 2023-03-17 NOTE — Progress Notes (Signed)
Circumcision Consent  Discussed with mom at bedside about circumcision.   Circumcision is a surgery that removes the skin that covers the tip of the penis, called the "foreskin." Circumcision is usually done when a boy is between 72 and 45 days old, sometimes up to 31-20 weeks old.  The most common reasons boys are circumcised include for cultural/religious beliefs or for parental preference (potentially easier to clean, so baby looks like daddy, etc).  There may be some medical benefits for circumcision:   Circumcised boys seem to have slightly lower rates of: ? Urinary tract infections (per the American Academy of Pediatrics an uncircumcised boy has a 1/100 chance of developing a UTI in the first year of life, a circumcised boy at a 10/998 chance of developing a UTI in the first year of life- a 10% reduction) ? Penis cancer (typically rare- an uncircumcised female has a 1 in 100,000 chance of developing cancer of the penis) ? Sexually transmitted infection (in endemic areas, including HIV, HPV and Herpes- circumcision does NOT protect against gonorrhea, chlamydia, trachomatis, or syphilis) ? Phimosis: a condition where that makes retraction of the foreskin over the glans impossible (0.4 per 1000 boys per year or 0.6% of boys are affected by their 15th birthday)  Boys and men who are not circumcised can reduce these extra risks by: ? Cleaning their penis well ? Using condoms during sex  What are the risks of circumcision?  As with any surgical procedure, there are risks and complications. In circumcision, complications are rare and usually minor, the most common being: ? Bleeding- risk is reduced by holding each clamp for 30 seconds prior to a cut being made, and by holding pressure after the procedure is done ? Infection- the penis is cleaned prior to the procedure, and the procedure is done under sterile technique ? Damage to the urethra or amputation of the penis  How is circumcision done  in baby boys?  The baby will be placed on a special table and the legs restrained for their safety. Numbing medication is injected into the penis, and the skin is cleansed with betadine to decrease the risk of infection.   What to expect:  The penis will look red and raw for 5-7 days as it heals. We expect scabbing around where the cut was made, as well as clear-pink fluid and some swelling of the penis right after the procedure. If your baby's circumcision starts to bleed or develops pus, please contact your pediatrician immediately.  All questions were answered and mother consented.  Celedonio Savage, MD 8:51 AM

## 2023-03-17 NOTE — Lactation Note (Signed)
This note was copied from a baby's chart. Lactation Consultation Note  Patient Name: April Young [id number] Date: 03/17/2023  Mother is formula feeding her baby.  Consult Status  complete    Omar Person 03/17/2023, 4:48 PM

## 2023-03-17 NOTE — Anesthesia Postprocedure Evaluation (Signed)
Anesthesia Post Note  Patient: April Young  Procedure(s) Performed: AN AD HOC LABOR EPIDURAL     Patient location during evaluation: Mother Baby Anesthesia Type: Epidural Level of consciousness: awake and alert Pain management: pain level controlled Vital Signs Assessment: post-procedure vital signs reviewed and stable Respiratory status: spontaneous breathing, nonlabored ventilation and respiratory function stable Cardiovascular status: stable Postop Assessment: no headache, no backache and epidural receding Anesthetic complications: no   No notable events documented.  Last Vitals:  Vitals:   03/17/23 0032 03/17/23 0049  BP: 115/76 115/67  Pulse: 95 82  Resp:    Temp:  37.1 C  SpO2:  100%    Last Pain:  Vitals:   03/17/23 0015  TempSrc:   PainSc: 0-No pain   Pain Goal:                Epidural/Spinal Function Cutaneous sensation: Tingles (03/17/23 0015), Patient able to flex knees: Yes (03/17/23 0015), Patient able to lift hips off bed: Yes (03/17/23 0015), Back pain beyond tenderness at insertion site: No (03/17/23 0015), Progressively worsening motor and/or sensory loss: No (03/17/23 0015), Bowel and/or bladder incontinence post epidural: No (03/17/23 0015)  Amberlea Spagnuolo

## 2023-03-17 NOTE — Progress Notes (Signed)
POSTPARTUM PROGRESS NOTE  Post Partum Day 1  Subjective:  April Young is a 27 y.o. G2P1001 s/p SVD at [redacted]w[redacted]d.  She reports she is doing well. No acute events overnight. She denies any problems with ambulating, voiding or po intake. Denies nausea or vomiting.  Pain is well controlled.  Lochia is appropriate.  Objective: Blood pressure 113/65, pulse 76, temperature 99.2 F (37.3 C), temperature source Oral, resp. rate 19, last menstrual period 01/05/2022, SpO2 100 %.  Physical Exam:  General: alert, cooperative and no distress Chest: no respiratory distress Heart:regular rate, distal pulses intact Abdomen: soft, nontender,  Uterine Fundus: firm, appropriately tender DVT Evaluation: No calf swelling or tenderness Extremities: no edema Skin: warm, dry  Recent Labs    03/16/23 1715  HGB 10.0*  HCT 31.6*    Assessment/Plan: April Young is a 27 y.o. G2P1001 s/p SVD at [redacted]w[redacted]d   PPD#1 - Doing well  Routine postpartum care  Contraception: Depo Feeding: Formula Dispo: Plan for discharge tomorrow .   LOS: 1 day   Derrel Nip, MD  OB Fellow  03/17/2023, 8:52 AM

## 2023-03-18 ENCOUNTER — Encounter (HOSPITAL_COMMUNITY): Payer: Self-pay | Admitting: *Deleted

## 2023-03-18 LAB — BIRTH TISSUE RECOVERY COLLECTION (PLACENTA DONATION)

## 2023-03-18 LAB — RH IG WORKUP (INCLUDES ABO/RH)
Fetal Screen: NEGATIVE
Gestational Age(Wks): 40
Unit division: 0

## 2023-03-18 MED ORDER — IBUPROFEN 600 MG PO TABS: 600.0000 mg | ORAL_TABLET | Freq: Four times a day (QID) | ORAL | 0 refills | Status: DC

## 2023-03-18 NOTE — Social Work (Signed)
CSW acknowledged consult and completed a clinical assessment.  There are no barriers to d/c.  Clinical assessment notes will be entered at a later time.  Kazia Grisanti, LCSWA Clinical Social Worker [phone]  

## 2023-03-18 NOTE — Clinical Social Work Maternal (Signed)
CLINICAL SOCIAL WORK MATERNAL/CHILD NOTE  Patient Details  Name: April Young MRN: [id number] Date of Birth: 06-02-1996  Date:  03/18/2023  Clinical Social Worker Initiating Note:  April Young April Young Date/Time: Initiated:  03/18/23/1423     Child's Name:  April Young   Biological Parents:  Mother, Father April Young [birth year] 1997, April Young 03/25/1996)   Need for Interpreter:  None   Reason for Referral:  Current Substance Use/Substance Use During Pregnancy     Address:  [address] [place] [address]    Phone number:  [phone] (home)     Additional phone number:   Household Members/Support Persons (HM/SP):   Household Member/Support Person 1   HM/SP Name Relationship DOB or Age  HM/SP -1 April Young daughter 07/06/2017  HM/SP -2        HM/SP -3        HM/SP -4        HM/SP -5        HM/SP -6        HM/SP -7        HM/SP -8          Natural Supports (not living in the home):  Parent   Professional Supports: None   Employment: Unemployed   Type of Work:     Education:  Engineer, agricultural   Homebound arranged:    Surveyor, quantity Resources:  Medicaid   Other Resources:  Sales executive  , WIC   Cultural/Religious Considerations Which May Impact Care:    Strengths:  Ability to meet basic needs  , Home prepared for child  , Pediatrician chosen   Psychotropic Medications:         Pediatrician:    [place]  Pediatrician List:   [facility] [place]    [facility]  [facility]      Pediatrician Fax Number:    Risk Factors/Current Problems:  Substance Use     Cognitive State:  Alert  , Able to Concentrate     Mood/Affect:  Calm  , Relaxed     CSW Assessment: CSW received consult for TH use during pregnancy. CSW met with MOB to complete assessment and offer support. CSW entered the room, observed MOB resting in bed holding the  infant and FOB at bedside. CSW introduced self, CSW role and reason for visit, MOB was agreeable to visit and allowed FOB to remain in the room. CSW inquired about how MOB was feeling, MOB reported good. CSW confirmed [place] address and phone number, MOB verified the address and phone number on file were correct.   CSW inquired about any H hx, MOB denied any MH concerns. CSW assessed for safety, MOB denied any SI or HI. CSW provided education regarding the baby blues period vs. perinatal mood disorders, discussed treatment and gave resources for mental health follow up if concerns arise.  CSW recommends self-evaluation during the postpartum time period using the New Mom Checklist from Postpartum Progress and encouraged MOB to contact a medical professional if symptoms are noted at any time.  MOB identified FOB, her mom and dad as her supports.   CSW provided review of Sudden Infant Death Syndrome (SIDS) precautions. MOB identified [facility] for infants follow up care. MOB reported they have all necessary items for the infant including  a bassinet and car seat.   CSW inquired about MOB THC use, MOB  reported her last use was about a month ago. MOB reported she used to help with sickness. CSW explained the hospital drug screen policy, MOB verbalized understanding. CSW explained infants UDS was positive for THC and a CPS report would be made, MOB verbalized understanding. CSW made CPS report to [facility].  CSW identifies no further need for intervention and no barriers to discharge at this time.  CSW Plan/Description:  No Further Intervention Required/No Barriers to Discharge, Sudden Infant Death Syndrome (SIDS) Education, Perinatal Mood and Anxiety Disorder (PMADs) Education, Hospital Drug Screen Policy Information, Child Protective Service Report  , CSW Will Continue to Monitor Umbilical Cord Tissue Drug Screen Results and Make Report if Sandy Salaam, LCSW 03/18/2023, 2:46 PM

## 2023-03-20 LAB — BPAM RBC
Blood Product Expiration Date: 202406142359
Blood Product Expiration Date: 202407082359

## 2023-03-20 LAB — TYPE AND SCREEN
Antibody Screen: POSITIVE
Donor AG Type: NEGATIVE
Donor AG Type: NEGATIVE
Unit division: 0

## 2023-03-21 ENCOUNTER — Encounter (HOSPITAL_COMMUNITY): Payer: Self-pay | Admitting: Family Medicine

## 2023-03-31 DIAGNOSIS — Z23 Encounter for immunization: Secondary | ICD-10-CM | POA: Diagnosis not present

## 2023-04-03 ENCOUNTER — Telehealth (HOSPITAL_COMMUNITY): Payer: Self-pay | Admitting: *Deleted

## 2023-04-03 NOTE — Telephone Encounter (Signed)
04/03/2023  Name: April Young MRN: [id number] DOB: 1996/09/03  Reason for Call:  Transition of Care Hospital Discharge Call  Contact Status: Patient Contact Status: Complete  Language assistant needed:          Follow-Up Questions: Do You Have Any Concerns About Your Health As You Heal From Delivery?: No Do You Have Any Concerns About Your Infants Health?: No  Edinburgh Postnatal Depression Scale:  In the Past 7 Days:   EPDS not completed at this time. Patient stated, "I think I did that with my case worker. I am doing fine." PHQ2-9 Depression Scale:     Discharge Follow-up: Edinburgh score requires follow up?: No Patient was advised of the following resources:: Breastfeeding Support Group, Support Group  Post-discharge interventions: Reviewed Newborn Safe Sleep Practices  Signature Deforest Hoyles, RN, [phone]

## 2023-04-07 ENCOUNTER — Other Ambulatory Visit: Payer: Medicaid Other

## 2023-04-12 ENCOUNTER — Other Ambulatory Visit (INDEPENDENT_AMBULATORY_CARE_PROVIDER_SITE_OTHER): Payer: Medicaid Other

## 2023-04-12 ENCOUNTER — Other Ambulatory Visit (HOSPITAL_COMMUNITY)
Admission: RE | Admit: 2023-04-12 | Discharge: 2023-04-12 | Disposition: A | Discharge status: Home / Self Care | Payer: Medicaid Other | Source: Ambulatory Visit | Attending: Obstetrics & Gynecology | Admitting: Obstetrics & Gynecology

## 2023-04-12 DIAGNOSIS — N898 Other specified noninflammatory disorders of vagina: Secondary | ICD-10-CM | POA: Insufficient documentation

## 2023-04-12 NOTE — Progress Notes (Signed)
   NURSE VISIT- VAGINITIS/STD/POC  SUBJECTIVE:  April Young is a 27 y.o. [id number] postpartumfemale here for a vaginal swab for vaginitis screening.  She reports the following symptoms: vaginal discharge for several  days. Says she went through her boyfriend's phone and saw a message to one of his friend's that he had slept with someone that had an STD. Pt states she has not had intercourse with him since delivery but did prior.  Denies abnormal vaginal bleeding, significant pelvic pain, fever, or UTI symptoms.  OBJECTIVE:  There were no vitals taken for this visit.  Appears well, in no apparent distress  ASSESSMENT: Vaginal swab for vaginitis screening  PLAN: Self-collected vaginal probe for Gonorrhea, Chlamydia, Trichomonas, Bacterial Vaginosis, Yeast sent to lab Treatment: to be determined once results are received Follow-up as needed if symptoms persist/worsen, or new symptoms develop  Jobe Marker  04/12/2023 3:48 PM

## 2023-04-14 ENCOUNTER — Telehealth: Payer: Self-pay | Admitting: Adult Health

## 2023-04-14 ENCOUNTER — Other Ambulatory Visit: Payer: Self-pay | Admitting: Adult Health

## 2023-04-14 LAB — CERVICOVAGINAL ANCILLARY ONLY
Bacterial Vaginitis (gardnerella): POSITIVE — AB
Candida Glabrata: NEGATIVE
Candida Vaginitis: NEGATIVE
Chlamydia: NEGATIVE
Comment: NEGATIVE
Comment: NEGATIVE
Comment: NEGATIVE
Comment: NEGATIVE
Comment: NEGATIVE
Comment: NORMAL
Neisseria Gonorrhea: NEGATIVE
Trichomonas: NEGATIVE

## 2023-04-14 MED ORDER — METRONIDAZOLE 500 MG PO TABS: 500.0000 mg | ORAL_TABLET | Freq: Two times a day (BID) | ORAL | 0 refills | Status: DC

## 2023-04-14 NOTE — Telephone Encounter (Signed)
Left message that +BV on vaginal swab and rx sent for flagyl and that chart said she was bottle feeding, if breast feeding do not take and call office Friday

## 2023-04-14 NOTE — Progress Notes (Signed)
+  BV on vaginal swab, will rx flagyl, no sex or alcohol while taking  

## 2023-04-16 ENCOUNTER — Telehealth: Payer: Self-pay | Admitting: *Deleted

## 2023-04-16 ENCOUNTER — Telehealth: Payer: Self-pay | Admitting: Women's Health

## 2023-04-16 NOTE — Telephone Encounter (Signed)
[facility] has to order the [place]. It will be in on Monday. Pt states she will just take the Flagyl. She don't want to wait until Monday. Dr. Despina Hidden aware. JSY

## 2023-04-16 NOTE — Telephone Encounter (Signed)
Patient states she breast feeds & bottle feeds. She was prescribed flagyl. Please advice.

## 2023-04-16 NOTE — Telephone Encounter (Signed)
Pt has BV and is breastfeeding. I spoke with Dr. Despina Hidden. Dr. Despina Hidden advised a single dose of Nuvessa. Med called to [facility]. Pt aware. JSY

## 2023-04-19 ENCOUNTER — Ambulatory Visit: Payer: Medicaid Other | Admitting: Women's Health

## 2023-04-28 ENCOUNTER — Other Ambulatory Visit: Payer: Self-pay

## 2023-05-04 ENCOUNTER — Ambulatory Visit: Payer: Medicaid Other | Admitting: Women's Health

## 2023-05-06 ENCOUNTER — Encounter: Payer: Self-pay | Admitting: Advanced Practice Midwife

## 2023-05-06 ENCOUNTER — Ambulatory Visit (INDEPENDENT_AMBULATORY_CARE_PROVIDER_SITE_OTHER): Payer: Medicaid Other | Admitting: Advanced Practice Midwife

## 2023-05-06 DIAGNOSIS — N912 Amenorrhea, unspecified: Secondary | ICD-10-CM

## 2023-05-06 DIAGNOSIS — Z3201 Encounter for pregnancy test, result positive: Secondary | ICD-10-CM

## 2023-05-06 DIAGNOSIS — R8290 Unspecified abnormal findings in urine: Secondary | ICD-10-CM

## 2023-05-06 LAB — POCT URINALYSIS DIPSTICK OB
Blood, UA: NEGATIVE
Glucose, UA: NEGATIVE
Nitrite, UA: POSITIVE

## 2023-05-06 LAB — POCT URINE PREGNANCY: Preg Test, Ur: POSITIVE — AB

## 2023-05-06 MED ORDER — FLUCONAZOLE 150 MG PO TABS: ORAL_TABLET | ORAL | 2 refills | Status: DC

## 2023-05-06 MED ORDER — MEDROXYPROGESTERONE ACETATE 150 MG/ML IM SUSY: 1.0000 mL | PREFILLED_SYRINGE | INTRAMUSCULAR | 3 refills | Status: DC

## 2023-05-06 NOTE — Addendum Note (Signed)
Addended by: Moss Mc on: 05/06/2023 10:24 AM   Modules accepted: Orders

## 2023-05-06 NOTE — Patient Instructions (Signed)
If HCG is < 5, you are not pregnant.  If it is >5, then we will need to repeat the HCG on Monday. I went ahead and ordered the test for Monday (just in case), so you can just go to [facility].  When you get that result (you will see it before I do), if the number is going down, then we can do your depo--call the office and make an appointment with the nurse for that (pick up the depo from the pharmacy and bring it with you).  Let me know if you have any questions.  Drenda Freeze

## 2023-05-06 NOTE — Progress Notes (Signed)
Post Partum Visit Note   Chief Complaint:   Postpartum Care (Patient thinks she has a yeast infection)  History of Present Illness:   April Young is a 27 y.o. [phone]  female being seen today for a postpartum visit. She is 6 weeks postpartum following a spontaneous vaginal delivery at 40.0 gestational weeks. IOL: No, Anesthesia: epidural.  Laceration: none.  Complications: none. Inpatient contraception: no.   Pregnancy uncomplicated. Tobacco use: no. Substance use disorder: no. Last pap smear:     Component Value Date/Time   DIAGPAP  01/28/2022 1104    - Negative for Intraepithelial Lesions or Malignancy (NILM)   DIAGPAP - Benign reactive/reparative changes 01/28/2022 1104   ADEQPAP  01/28/2022 1104    Satisfactory for evaluation; transformation zone component ABSENT.   Marland Kitchen Next pap smear due: 2026 No LMP recorded.  Postpartum course has been uncomplicated. Bleeding no bleeding.  thinks she has a yeast infection, noticed increased DC . Bowel function is normal. Bladder function is normal. Urinary incontinence? No, fecal incontinence? No Patient is sexually active. Last sexual activity: 2 days ago, first time around 7/16.    Upstream - 05/06/23 0936       Pregnancy Intention Screening   Does the patient want to become pregnant in the next year? No    Does the patient's partner want to become pregnant in the next year? No    Would the patient like to discuss contraceptive options today? Yes      Contraception Wrap Up   Current Method No Contraceptive Precautions    End Method Hormonal Injection    Contraception Counseling Provided Yes            The pregnancy intention screening data noted above was reviewed. Potential methods of contraception were discussed. The patient elected to proceed with Hormonal Injection.  Edinburgh Postpartum Depression Screening: Negative  Edinburgh Postnatal Depression Scale - 05/06/23 0935       Edinburgh Postnatal Depression Scale:  In  the Past 7 Days   I have been able to laugh and see the funny side of things. 0    I have looked forward with enjoyment to things. 0    I have blamed myself unnecessarily when things went wrong. 0    I have been anxious or worried for no good reason. 0    I have felt scared or panicky for no good reason. 0    Things have been getting on top of me. 0    I have been so unhappy that I have had difficulty sleeping. 0    I have felt sad or miserable. 0    I have been so unhappy that I have been crying. 0    The thought of harming myself has occurred to me. 0    Edinburgh Postnatal Depression Scale Total 0            Baby's course has been uncomplicated. Baby is feeding by bottle. Infant has a pediatrician/family doctor? Yes.  Childcare strategy if returning to work/school: yes.  Pt has material needs met for her and baby: Yes.   Review of Systems:   Pertinent items are noted in HPI Denies Abnormal vaginal discharge w/ itching/odor/irritation, headaches, visual changes, shortness of breath, chest pain, abdominal pain, severe nausea/vomiting, or problems with urination or bowel movements. Pertinent History Reviewed:  Reviewed past medical,surgical, obstetrical and family history.  Reviewed problem list, medications and allergies. OB History  Gravida Para Term Preterm AB Living  2  2 2 0 0 2  SAB IAB Ectopic Multiple Live Births  0     0 2    # Outcome Date GA Lbr Len/2nd Weight Sex Type Anes PTL Lv  2 Term 03/16/23 [redacted]w[redacted]d / 00:53 8 lb 6 oz (3.8 kg) M Vag-Spont EPI  LIV  1 Term 07/06/17 [redacted]w[redacted]d 02:57 / 00:17 6 lb 13.4 oz (3.1 kg) F Vag-Spont Local N LIV     Birth Comments: WNL     Complications: Gestational thrombocytopenia (HCC), Fetal growth restriction antepartum   Physical Assessment:   Vitals:   05/06/23 0933  BP: 102/62  Pulse: 63  Weight: 114 lb (51.7 kg)  Height: 5\' 8"  (1.727 m)  Body mass index is 17.33 kg/m.  Objective:  Blood pressure 102/62, pulse 63, height 5\' 8"   (1.727 m), weight 114 lb (51.7 kg), not currently breastfeeding.  General:  alert, cooperative, and no distress   Breasts:  negative  Lungs: Normal respiratory effort  Heart:  regular rate and rhythm  Abdomen: soft, non-tender,    Vulva:  normal  Vagina: vagina positive for clumpy white DC c/w yeast  ,   Cervix:  normal  Corpus: Well involuted  Adnexa:  not evaluated  Rectal Exam: no hemorrhoids          Results for orders placed or performed in visit on 05/06/23 (from the past 24 hour(s))  POC Urinalysis Dipstick OB   Collection Time: 05/06/23  9:52 AM  Result Value Ref Range   Color, UA     Clarity, UA     Glucose, UA Negative Negative   Bilirubin, UA     Ketones, UA 1+    Spec Grav, UA     Blood, UA negative    pH, UA     POC,PROTEIN,UA Trace Negative, Trace, Small (1+), Moderate (2+), Large (3+), 4+   Urobilinogen, UA     Nitrite, UA positive    Leukocytes, UA Trace (A) Negative   Appearance     Odor      Assessment & Plan:  1) Postpartum exam 2) 6 wks s/p spontaneous vaginal delivery 3) bottle feeding 4) Depression screening 5) Contraception counseling:   UPT is + today.  Unclear if new pg or left over from prior pregnancy--IC timing is equivocal.  Plans depo if neg, plans TAB if +.  7)  Yeast infection:  rx diflucan  Essential components of care per ACOG recommendations:  1.  Mood and well being:  If positive depression screen, discussed and plan developed.  If using tobacco we discussed reduction/cessation and risk of relapse If current substance abuse, we discussed and referral to local resources was offered.   2. Infant care and feeding:  If breastfeeding, discussed returning to work, pumping, breastfeeding-associated pain, guidance regarding return to fertility while lactating if not using another method. If needed, patient was provided with a letter to be allowed to pump q 2-3hrs to support lactation in a private location with access to a refrigerator to  store breastmilk.   Recommended that all caregivers be immunized for flu, pertussis and other preventable communicable diseases If pt does not have material needs met for her/baby, referred to local resources for help obtaining these.  3. Sexuality, contraception and birth spacing Provided guidance regarding sexuality, management of dyspareunia, and resumption of intercourse Discussed avoiding interpregnancy interval <42mths and recommended birth spacing of 18 months  4. Sleep and fatigue Discussed coping options for fatigue and sleep disruption Encouraged family/partner/community support of  4 hrs of uninterrupted sleep to help with mood and fatigue  5. Physical recovery  If pt had a C/S, assessed incisional pain and providing guidance on normal vs prolonged recovery If pt had a laceration, perineal healing and pain reviewed.  If urinary or fecal incontinence, discussed management and referred to PT or uro/gyn if indicated  Patient is safe to resume physical activity. Discussed attainment of healthy weight.  6.  Chronic disease management Discussed pregnancy complications if any, and their implications for future childbearing and long-term maternal health. Review recommendations for prevention of recurrent pregnancy complications, such as aspirin to reduce risk of preeclampsia not applicable. Pt had GDM: No. If yes, 2hr GTT scheduled: not applicable. Reviewed medications and non-pregnant dosing including consideration of whether pt is breastfeeding using a reliable resource such as LactMed: not applicable Referred for f/u w/ PCP or subspecialist providers as indicated: not applicable  7. Health maintenance Mammogram at 26yo or earlier if indicated Pap smears as indicated  Meds:  Meds ordered this encounter  Medications   medroxyPROGESTERone Acetate 150 MG/ML SUSY    Sig: Inject 1 mL (150 mg total) into the muscle every 3 (three) months.    Dispense:  1 mL    Refill:  3    Order  Specific Question:   Supervising Provider    Answer:   Despina Hidden, LUTHER H [[id number]]   fluconazole (DIFLUCAN) 150 MG tablet    Sig: 1 po stat; repeat in 3 days    Dispense:  2 tablet    Refill:  2    Order Specific Question:   Supervising Provider    Answer:   Lazaro Arms [[id number]]    Follow-up: No follow-ups on file.   Orders Placed This Encounter  Procedures   Urine Culture   B-HCG Quant   Beta hCG quant (ref lab)   POC Urinalysis Dipstick OB       Jacklyn Shell DNP, CNM [facility] [employer], [facility] 05/06/2023 10:18 AM

## 2023-05-07 ENCOUNTER — Encounter: Payer: Self-pay | Admitting: Advanced Practice Midwife

## 2023-05-20 ENCOUNTER — Ambulatory Visit: Payer: Medicaid Other

## 2023-05-25 ENCOUNTER — Emergency Department (HOSPITAL_COMMUNITY): Admission: EM | Admit: 2023-05-25 | Payer: Medicaid Other | Source: Home / Self Care

## 2023-05-25 ENCOUNTER — Encounter (HOSPITAL_COMMUNITY): Payer: Self-pay

## 2023-05-25 ENCOUNTER — Other Ambulatory Visit: Payer: Self-pay

## 2023-05-25 DIAGNOSIS — Z87891 Personal history of nicotine dependence: Secondary | ICD-10-CM | POA: Diagnosis not present

## 2023-05-25 DIAGNOSIS — R1032 Left lower quadrant pain: Secondary | ICD-10-CM | POA: Diagnosis not present

## 2023-05-25 LAB — CBC WITH DIFFERENTIAL/PLATELET
Abs Immature Granulocytes: 0.03 10*3/uL (ref 0.00–0.07)
Basophils Absolute: 0 10*3/uL (ref 0.0–0.1)
Basophils Relative: 0 %
Eosinophils Absolute: 0 10*3/uL (ref 0.0–0.5)
Eosinophils Relative: 0 %
HCT: 39.7 % (ref 36.0–46.0)
Hemoglobin: 12.5 g/dL (ref 12.0–15.0)
Immature Granulocytes: 0 %
Lymphocytes Relative: 21 %
Lymphs Abs: 1.8 10*3/uL (ref 0.7–4.0)
MCH: 26.4 pg (ref 26.0–34.0)
MCHC: 31.5 g/dL (ref 30.0–36.0)
MCV: 83.9 fL (ref 80.0–100.0)
Monocytes Absolute: 0.8 10*3/uL (ref 0.1–1.0)
Monocytes Relative: 9 %
Neutro Abs: 5.7 10*3/uL (ref 1.7–7.7)
Neutrophils Relative %: 70 %
Platelets: 140 10*3/uL — ABNORMAL LOW (ref 150–400)
RBC: 4.73 MIL/uL (ref 3.87–5.11)
RDW: 15.6 % — ABNORMAL HIGH (ref 11.5–15.5)
WBC: 8.3 10*3/uL (ref 4.0–10.5)
nRBC: 0 % (ref 0.0–0.2)

## 2023-05-25 LAB — COMPREHENSIVE METABOLIC PANEL
ALT: 16 U/L (ref 0–44)
AST: 18 U/L (ref 15–41)
Albumin: 3.9 g/dL (ref 3.5–5.0)
Alkaline Phosphatase: 82 U/L (ref 38–126)
Anion gap: 10 (ref 5–15)
BUN: 12 mg/dL (ref 6–20)
CO2: 24 mmol/L (ref 22–32)
Calcium: 8.5 mg/dL — ABNORMAL LOW (ref 8.9–10.3)
Chloride: 100 mmol/L (ref 98–111)
Creatinine, Ser: 0.88 mg/dL (ref 0.44–1.00)
GFR, Estimated: >60 mL/min (ref >60)
Glucose, Bld: 97 mg/dL (ref 70–99)
Potassium: 3.1 mmol/L — ABNORMAL LOW (ref 3.5–5.1)
Sodium: 134 mmol/L — ABNORMAL LOW (ref 135–145)
Total Bilirubin: 0.9 mg/dL (ref 0.3–1.2)
Total Protein: 7.5 g/dL (ref 6.5–8.1)

## 2023-05-25 LAB — LIPASE, BLOOD: Lipase: 22 U/L (ref 11–51)

## 2023-05-25 MED ORDER — IBUPROFEN 800 MG PO TABS
800.0000 mg | ORAL_TABLET | Freq: Once | ORAL | Status: AC
  Administered 2023-05-26: 800 mg via ORAL
  Filled 2023-05-25: qty 1

## 2023-05-25 NOTE — ED Notes (Signed)
Pt just went and used the bathroom I could get a sample. I told pt to use call light when she has to go again.

## 2023-05-25 NOTE — ED Notes (Signed)
Pts poc preg is negative. Results are not crossing over at this time. RN notified.

## 2023-05-25 NOTE — ED Triage Notes (Signed)
Pt states she had an ovarian cyst when she was pregnant & believes it has ruptured.  Pt delivered son 2 months ago.  Reports LLQ pain, nausea, vomiting x 4 days ago.  States she thought it was her period because she had some bleeding but the bleeding stopped & the pain has continued.

## 2023-05-25 NOTE — ED Provider Notes (Signed)
[facility]-EMERGENCY DEPT [facility] Emergency Department Provider Note MRN:  [id number]  Arrival date & time: 05/26/23     Chief Complaint   Abdominal pain History of Present Illness   April Young is a 27 y.o. year-old female with no pertinent past medical history presenting to the ED with chief complaint of abdominal pain.  Left lower quadrant abdominal pain worsening over the past 3 to 4 days.  Thinks maybe it is her ovarian cyst, thinks maybe it has ruptured.  Denies fever.  Some vaginal spotting, thinks maybe she is starting her period.  Review of Systems  A thorough review of systems was obtained and all systems are negative except as noted in the HPI and PMH.   Patient's Health History    Past Medical History:  Diagnosis Date   Abdominal pain affecting pregnancy 03/10/2017   Abnormal antibody titer 07/28/2022   07/28/22 Anti C anti D titer was 4    Candida vaginitis 03/10/2017   Hx of trichomoniasis 11/2016   Rh negative status during pregnancy    Sepsis due to Escherichia coli (HCC) 03/2017   Urinary tract infection 03/10/2017    Past Surgical History:  Procedure Laterality Date   SKIN SURGERY     abcess    Family History  Problem Relation Age of Onset   COPD Maternal Grandmother     Social History   Socioeconomic History   Marital status: Significant Other    Spouse name: Not on file   Number of children: 1   Years of education: Not on file   Highest education level: Not on file  Occupational History   Not on file  Tobacco Use   Smoking status: Former    Types: E-cigarettes   Smokeless tobacco: Never  Vaping Use   Vaping status: Former  Substance and Sexual Activity   Alcohol use: No   Drug use: Yes    Types: Marijuana    Comment: "every now and then"2 weeks ago last use   Sexual activity: Yes    Birth control/protection: None  Other Topics Concern   Not on file  Social History Narrative   Not on file   Social Determinants of Health    Financial Resource Strain: High Risk (09/07/2022)   Overall Financial Resource Strain (CARDIA)    Difficulty of Paying Living Expenses: Very hard  Food Insecurity: No Food Insecurity (03/16/2023)   Hunger Vital Sign    Worried About Running Out of Food in the Last Year: Never true    Ran Out of Food in the Last Year: Never true  Transportation Needs: No Transportation Needs (03/16/2023)   PRAPARE - Administrator, Civil Service (Medical): No    Lack of Transportation (Non-Medical): No  Physical Activity: Insufficiently Active (09/07/2022)   Exercise Vital Sign    Days of Exercise per Week: 2 days    Minutes of Exercise per Session: 10 min  Stress: No Stress Concern Present (09/07/2022)   [employer] of Occupational Health - Occupational Stress Questionnaire    Feeling of Stress : Only a little  Social Connections: Socially Isolated (09/07/2022)   Social Connection and Isolation Panel [NHANES]    Frequency of Communication with Friends and Family: Three times a week    Frequency of Social Gatherings with Friends and Family: Once a week    Attends Religious Services: Never    Database administrator or Organizations: No    Attends Banker Meetings: Never  Marital Status: Never married  Intimate Partner Violence: Not At Risk (03/16/2023)   Humiliation, Afraid, Rape, and Kick questionnaire    Fear of Current or Ex-Partner: No    Emotionally Abused: No    Physically Abused: No    Sexually Abused: No     Physical Exam   Vitals:   05/25/23 2142  BP: 112/60  Pulse: 85  Resp: 16  Temp: 99.4 F (37.4 C)  SpO2: 100%    CONSTITUTIONAL: Well-appearing, NAD NEURO/PSYCH:  Alert and oriented x 3, no focal deficits EYES:  eyes equal and reactive ENT/NECK:  no LAD, no JVD CARDIO: Regular rate, well-perfused, normal S1 and S2 PULM:  CTAB no wheezing or rhonchi GI/GU:  non-distended, non-tender MSK/SPINE:  No gross deformities, no edema SKIN:  no rash,  atraumatic   *Additional and/or pertinent findings included in MDM below  Diagnostic and Interventional Summary    EKG Interpretation Date/Time:    Ventricular Rate:    PR Interval:    QRS Duration:    QT Interval:    QTC Calculation:   R Axis:      Text Interpretation:         Labs Reviewed  COMPREHENSIVE METABOLIC PANEL - Abnormal; Notable for the following components:      Result Value   Sodium 134 (*)    Potassium 3.1 (*)    Calcium 8.5 (*)    All other components within normal limits  CBC WITH DIFFERENTIAL/PLATELET - Abnormal; Notable for the following components:   RDW 15.6 (*)    Platelets 140 (*)    All other components within normal limits  URINALYSIS, ROUTINE W REFLEX MICROSCOPIC - Abnormal; Notable for the following components:   APPearance CLOUDY (*)    Hgb urine dipstick SMALL (*)    Ketones, ur 20 (*)    Protein, ur 100 (*)    Leukocytes,Ua MODERATE (*)    Bacteria, UA MANY (*)    Non Squamous Epithelial 0-5 (*)    All other components within normal limits  LIPASE, BLOOD  POC URINE PREG, ED    No orders to display    Medications  ibuprofen (ADVIL) tablet 800 mg (800 mg Oral Given 05/26/23 0009)     Procedures  /  Critical Care Procedures  ED Course and Medical Decision Making  Initial Impression and Ddx Vitals normal, oral temp 99.4 in triage, does not feel warm, patient denies subjective fevers.  Abdomen largely benign, no rebound guarding or rigidity, not really any focal tenderness.  Could be symptomatic ovarian cyst, possibly rupture.  Highly doubt torsion.  Other considerations include UTI, less likely diverticulitis.  Past medical/surgical history that increases complexity of ED encounter: 2 months postpartum  Interpretation of Diagnostics I personally reviewed the laboratory assessment and my interpretation is as follows: No significant blood count or electrolyte disturbance  Urinalysis with some evidence to suggest infection versus  contaminated sample.  Patient Reassessment and Ultimate Disposition/Management     Patient with continued normal vital signs, no emergent process, appropriate for discharge.  Patient management required discussion with the following services or consulting groups:  None  Complexity of Problems Addressed Acute illness or injury that poses threat of life of bodily function  Additional Data Reviewed and Analyzed Further history obtained from: None  Additional Factors Impacting ED Encounter Risk Prescriptions  Elmer Sow. Pilar Plate, MD [facility] [email or web address]  Final Clinical Impressions(s) / ED Diagnoses     ICD-10-CM  1. Left lower quadrant abdominal pain  R10.32       ED Discharge Orders          Ordered    cephALEXin (KEFLEX) 500 MG capsule  3 times daily        05/26/23 0015    naproxen (NAPROSYN) 500 MG tablet  2 times daily        05/26/23 0016             Discharge Instructions Discussed with and Provided to Patient:     Discharge Instructions      You were evaluated in the Emergency Department and after careful evaluation, we did not find any emergent condition requiring admission or further testing in the hospital.  Your exam/testing today was overall reassuring.  Symptoms may be due to your ovarian cyst.  We also found some evidence of infection in your urine.  Take the Keflex antibiotic as directed and follow-up closely with your OB/GYN.  Use the Naprosyn twice daily for pain.  Please return to the Emergency Department if you experience any worsening of your condition.  Thank you for allowing [place] to be a part of your care.        Sabas Sous, MD 05/26/23 [phone]

## 2023-05-26 LAB — URINALYSIS, ROUTINE W REFLEX MICROSCOPIC
Bilirubin Urine: NEGATIVE
Glucose, UA: NEGATIVE mg/dL
Ketones, ur: 20 mg/dL — AB
Nitrite: NEGATIVE
Protein, ur: 100 mg/dL — AB
Specific Gravity, Urine: 1.015 (ref 1.005–1.030)
WBC, UA: >50 WBC/hpf (ref 0–5)
pH: 6 (ref 5.0–8.0)

## 2023-05-26 MED ORDER — CEPHALEXIN 500 MG PO CAPS: 500.0000 mg | ORAL_CAPSULE | Freq: Three times a day (TID) | ORAL | 0 refills | Status: AC

## 2023-05-26 MED ORDER — NAPROXEN 500 MG PO TABS: 500.0000 mg | ORAL_TABLET | Freq: Two times a day (BID) | ORAL | 0 refills | Status: DC

## 2023-05-26 NOTE — Discharge Instructions (Addendum)
You were evaluated in the Emergency Department and after careful evaluation, we did not find any emergent condition requiring admission or further testing in the hospital.  Your exam/testing today was overall reassuring.  Symptoms may be due to your ovarian cyst.  We also found some evidence of infection in your urine.  Take the Keflex antibiotic as directed and follow-up closely with your OB/GYN.  Use the Naprosyn twice daily for pain.  Please return to the Emergency Department if you experience any worsening of your condition.  Thank you for allowing [place] to be a part of your care.

## 2023-08-10 NOTE — Congregational Nurse Program (Unsigned)
Health screening offered at [place] [employer] fall festival event . Written consent obtained from client. No follow up. CCNP - Renne Crigler RN

## 2023-10-18 ENCOUNTER — Other Ambulatory Visit: Payer: Medicaid Other

## 2023-11-02 ENCOUNTER — Other Ambulatory Visit: Payer: Medicaid Other

## 2023-12-30 ENCOUNTER — Ambulatory Visit: Admitting: Obstetrics and Gynecology

## 2024-01-04 ENCOUNTER — Other Ambulatory Visit (HOSPITAL_COMMUNITY)
Admission: RE | Admit: 2024-01-04 | Discharge: 2024-01-04 | Disposition: A | Discharge status: Home / Self Care | Source: Ambulatory Visit | Attending: Adult Health | Admitting: Adult Health

## 2024-01-04 ENCOUNTER — Encounter: Payer: Self-pay | Admitting: Adult Health

## 2024-01-04 ENCOUNTER — Ambulatory Visit (INDEPENDENT_AMBULATORY_CARE_PROVIDER_SITE_OTHER): Admitting: Adult Health

## 2024-01-04 VITALS — BP 99/58 | HR 86 | Ht 68.0 in | Wt 103.0 lb

## 2024-01-04 DIAGNOSIS — N898 Other specified noninflammatory disorders of vagina: Secondary | ICD-10-CM | POA: Diagnosis not present

## 2024-01-04 DIAGNOSIS — Z113 Encounter for screening for infections with a predominantly sexual mode of transmission: Secondary | ICD-10-CM | POA: Insufficient documentation

## 2024-01-04 DIAGNOSIS — Z30013 Encounter for initial prescription of injectable contraceptive: Secondary | ICD-10-CM | POA: Diagnosis not present

## 2024-01-04 DIAGNOSIS — Z3202 Encounter for pregnancy test, result negative: Secondary | ICD-10-CM | POA: Insufficient documentation

## 2024-01-04 LAB — POCT URINE PREGNANCY: Preg Test, Ur: NEGATIVE

## 2024-01-04 MED ORDER — MEDROXYPROGESTERONE ACETATE 150 MG/ML IM SUSY
150.0000 mg | PREFILLED_SYRINGE | Freq: Once | INTRAMUSCULAR | Status: AC
Start: 2024-01-04 — End: 2024-01-04
  Administered 2024-01-04: 150 mg via INTRAMUSCULAR

## 2024-01-04 MED ORDER — MEDROXYPROGESTERONE ACETATE 150 MG/ML IM SUSP: 150.0000 mg | INTRAMUSCULAR | 4 refills | Status: DC

## 2024-01-04 NOTE — Addendum Note (Signed)
 Addended by: Colen Darling on: 01/04/2024 12:04 PM   Modules accepted: Orders

## 2024-01-04 NOTE — Progress Notes (Signed)
  Subjective:     Patient ID: April Young, female   DOB: 10-12-1996, 28 y.o.   MRN: [id number]  HPI April Young is a 28 year old black female,single, G2P2002, in to discuss getting on depo, has used in th past. And she wants STD testing has vaginal discharge, no itching. No sex since before her period    Component Value Date/Time   DIAGPAP  01/28/2022 1104    - Negative for Intraepithelial Lesions or Malignancy (NILM)   DIAGPAP - Benign reactive/reparative changes 01/28/2022 1104   ADEQPAP  01/28/2022 1104    Satisfactory for evaluation; transformation zone component ABSENT.    Review of Systems +vaginal discharge Denies any itching No sex since before her period Reviewed past medical,surgical, social and family history. Reviewed medications and allergies.     Objective:   Physical Exam BP (!) 99/58 (BP Location: Left Arm, Patient Position: Sitting, Cuff Size: Normal)   Pulse 86   Ht 5\' 8"  (1.727 m)   Wt 103 lb (46.7 kg)   LMP 12/27/2023   Breastfeeding No   BMI 15.66 kg/m  UPT is negative Skin warm and dry.  Lungs: clear to ausculation bilaterally. Cardiovascular: regular rate and rhythm. She did self swab. Fall risk Is low  Upstream - 01/04/24 1137       Pregnancy Intention Screening   Does the patient want to become pregnant in the next year? No    Does the patient's partner want to become pregnant in the next year? No    Would the patient like to discuss contraceptive options today? No      Contraception Wrap Up   Current Method Female Condom    End Method Hormonal Injection    Contraception Counseling Provided Yes    How was the end contraceptive method provided? Prescription   first shot in office               Assessment:     1. Pregnancy examination or test, negative result - POCT urine pregnancy  2. Screening examination for STD (sexually transmitted disease) CV swab sent for GC/CHL,trich, BV and yeast  - Cervicovaginal ancillary only( [facility])  3. Vaginal discharge CV swab sent   4. Encounter for initial prescription of injectable contraceptive (Primary) UPT negative Last sex before last period Will give depo in office today and rx sent Use condoms for 2 weeks Return in 12 weeks for depo     Plan:     Return in 12 weeks for depo

## 2024-01-04 NOTE — Patient Instructions (Signed)
 Use condoms for 2 weeks Depo in 12 weeks

## 2024-01-05 ENCOUNTER — Encounter: Payer: Self-pay | Admitting: Adult Health

## 2024-01-05 ENCOUNTER — Other Ambulatory Visit: Payer: Self-pay | Admitting: Adult Health

## 2024-01-05 DIAGNOSIS — A749 Chlamydial infection, unspecified: Secondary | ICD-10-CM | POA: Insufficient documentation

## 2024-01-05 LAB — CERVICOVAGINAL ANCILLARY ONLY
Bacterial Vaginitis (gardnerella): POSITIVE — AB
Candida Glabrata: NEGATIVE
Candida Vaginitis: NEGATIVE
Chlamydia: POSITIVE — AB
Comment: NEGATIVE
Comment: NEGATIVE
Comment: NEGATIVE
Comment: NEGATIVE
Comment: NEGATIVE
Comment: NORMAL
Neisseria Gonorrhea: NEGATIVE
Trichomonas: NEGATIVE

## 2024-01-05 MED ORDER — METRONIDAZOLE 500 MG PO TABS: 500.0000 mg | ORAL_TABLET | Freq: Two times a day (BID) | ORAL | 0 refills | Status: DC

## 2024-01-05 MED ORDER — DOXYCYCLINE HYCLATE 100 MG PO TABS: 100.0000 mg | ORAL_TABLET | Freq: Two times a day (BID) | ORAL | 0 refills | Status: DC

## 2024-01-05 NOTE — Progress Notes (Signed)
+  BV and chlamydia on vaginal swab, will rx flagyl and doxycycline, no sex while taking meds, and no alcohol. Partner needs to be treated for chlamydia, too. NCCDCR sent. Needs appt for proof of cure in 4 weeks, can be self swab.

## 2024-02-01 ENCOUNTER — Ambulatory Visit: Admitting: *Deleted

## 2024-02-01 ENCOUNTER — Other Ambulatory Visit (HOSPITAL_COMMUNITY)
Admission: RE | Admit: 2024-02-01 | Discharge: 2024-02-01 | Disposition: A | Discharge status: Home / Self Care | Source: Ambulatory Visit | Attending: Obstetrics & Gynecology | Admitting: Obstetrics & Gynecology

## 2024-02-01 DIAGNOSIS — Z8619 Personal history of other infectious and parasitic diseases: Secondary | ICD-10-CM | POA: Diagnosis not present

## 2024-02-01 NOTE — Progress Notes (Signed)
   NURSE VISIT- VAGINITIS/STD/POC  SUBJECTIVE:  April Young is a 28 y.o. [id number] [name] here for a vaginal swab for STD screen, proof of cure after treatment for Chlamydia.  She reports the following symptoms: none for 0 days. Denies abnormal vaginal bleeding, significant pelvic pain, fever, or UTI symptoms.  OBJECTIVE:  LMP 12/27/2023   Appears well, in no apparent distress  ASSESSMENT: Vaginal swab for proof of cure after treatment for chlamydia  PLAN: Self-collected vaginal probe for Gonorrhea, Chlamydia sent to lab Treatment: to be determined once results are received Follow-up as needed if symptoms persist/worsen, or new symptoms develop  Kerrie Peek  02/01/2024 2:48 PM

## 2024-02-03 LAB — CERVICOVAGINAL ANCILLARY ONLY
Chlamydia: NEGATIVE
Comment: NEGATIVE
Comment: NORMAL
Neisseria Gonorrhea: NEGATIVE

## 2024-02-21 ENCOUNTER — Other Ambulatory Visit: Payer: Self-pay | Admitting: Adult Health

## 2024-02-21 MED ORDER — MEGESTROL ACETATE 40 MG PO TABS: ORAL_TABLET | ORAL | 1 refills | Status: DC

## 2024-02-21 NOTE — Progress Notes (Signed)
Will rx megace 

## 2024-02-23 ENCOUNTER — Telehealth: Payer: Self-pay | Admitting: *Deleted

## 2024-02-23 NOTE — Telephone Encounter (Signed)
 Pt's insurance has approved Megestrol  40 mg tablets # 45 approved for 02/22/24-02/21/25. [facility] is aware but Megace  is on back order with no estimated time to be available. Pharmacist will call pt and ask her to call us  regarding this. JSY

## 2024-03-28 ENCOUNTER — Ambulatory Visit

## 2024-04-11 ENCOUNTER — Ambulatory Visit

## 2024-04-17 ENCOUNTER — Ambulatory Visit

## 2024-04-18 ENCOUNTER — Ambulatory Visit (INDEPENDENT_AMBULATORY_CARE_PROVIDER_SITE_OTHER): Admitting: *Deleted

## 2024-04-18 ENCOUNTER — Other Ambulatory Visit (HOSPITAL_COMMUNITY)
Admission: RE | Admit: 2024-04-18 | Discharge: 2024-04-18 | Disposition: A | Discharge status: Home / Self Care | Source: Ambulatory Visit | Attending: Obstetrics & Gynecology | Admitting: Obstetrics & Gynecology

## 2024-04-18 DIAGNOSIS — Z3042 Encounter for surveillance of injectable contraceptive: Secondary | ICD-10-CM | POA: Diagnosis not present

## 2024-04-18 DIAGNOSIS — Z113 Encounter for screening for infections with a predominantly sexual mode of transmission: Secondary | ICD-10-CM | POA: Diagnosis not present

## 2024-04-18 DIAGNOSIS — R300 Dysuria: Secondary | ICD-10-CM | POA: Diagnosis not present

## 2024-04-18 LAB — POCT URINALYSIS DIPSTICK
Glucose, UA: NEGATIVE
Nitrite, UA: NEGATIVE
Protein, UA: POSITIVE — AB

## 2024-04-18 LAB — POCT URINE PREGNANCY: Preg Test, Ur: NEGATIVE

## 2024-04-18 MED ORDER — MEDROXYPROGESTERONE ACETATE 150 MG/ML IM SUSY
150.0000 mg | PREFILLED_SYRINGE | Freq: Once | INTRAMUSCULAR | Status: AC
Start: 2024-04-18 — End: 2024-04-18
  Administered 2024-04-18: 150 mg via INTRAMUSCULAR

## 2024-04-18 NOTE — Progress Notes (Signed)
   NURSE VISIT- INJECTION  SUBJECTIVE:  April Young is a 28 y.o. [phone] female here for a Depo Provera  for contraception/period management. She is a GYN patient.   OBJECTIVE:  There were no vitals taken for this visit.  Appears well, in no apparent distress  Injection administered in: Left upper quad. gluteus  Meds ordered this encounter  Medications   medroxyPROGESTERone  Acetate SUSY 150 mg    ASSESSMENT: GYN patient Depo Provera  for contraception/period management PLAN: Follow-up: in 11-13 weeks for next Depo   Pt also requested to do swab for std screen. Pt also reports thinking she may have a uti. Urine sent for culture, not enough for urinalysis too.   Alan LITTIE Fischer  04/18/2024 3:30 PM

## 2024-04-20 LAB — CERVICOVAGINAL ANCILLARY ONLY
Bacterial Vaginitis (gardnerella): POSITIVE — AB
Candida Glabrata: NEGATIVE
Candida Vaginitis: NEGATIVE
Chlamydia: NEGATIVE
Comment: NEGATIVE
Comment: NEGATIVE
Comment: NEGATIVE
Comment: NEGATIVE
Comment: NEGATIVE
Comment: NORMAL
Neisseria Gonorrhea: NEGATIVE
Trichomonas: NEGATIVE

## 2024-04-21 ENCOUNTER — Ambulatory Visit: Payer: Self-pay | Admitting: Adult Health

## 2024-04-21 LAB — URINE CULTURE

## 2024-04-21 MED ORDER — METRONIDAZOLE 500 MG PO TABS: 500.0000 mg | ORAL_TABLET | Freq: Two times a day (BID) | ORAL | 0 refills | Status: DC

## 2024-04-21 MED ORDER — SULFAMETHOXAZOLE-TRIMETHOPRIM 800-160 MG PO TABS: 1.0000 | ORAL_TABLET | Freq: Two times a day (BID) | ORAL | 0 refills | Status: DC

## 2024-06-04 ENCOUNTER — Emergency Department (HOSPITAL_COMMUNITY)
Admission: EM | Admit: 2024-06-04 | Discharge: 2024-06-05 | Disposition: A | Discharge status: Home / Self Care | Attending: Emergency Medicine | Admitting: Emergency Medicine

## 2024-06-04 ENCOUNTER — Other Ambulatory Visit: Payer: Self-pay

## 2024-06-04 ENCOUNTER — Emergency Department (HOSPITAL_COMMUNITY)

## 2024-06-04 ENCOUNTER — Encounter (HOSPITAL_COMMUNITY): Payer: Self-pay | Admitting: Emergency Medicine

## 2024-06-04 DIAGNOSIS — Z23 Encounter for immunization: Secondary | ICD-10-CM | POA: Diagnosis not present

## 2024-06-04 DIAGNOSIS — S01511A Laceration without foreign body of lip, initial encounter: Secondary | ICD-10-CM | POA: Diagnosis not present

## 2024-06-04 DIAGNOSIS — S0990XA Unspecified injury of head, initial encounter: Secondary | ICD-10-CM | POA: Diagnosis not present

## 2024-06-04 DIAGNOSIS — Y92410 Unspecified street and highway as the place of occurrence of the external cause: Secondary | ICD-10-CM | POA: Insufficient documentation

## 2024-06-04 DIAGNOSIS — S0993XA Unspecified injury of face, initial encounter: Secondary | ICD-10-CM | POA: Diagnosis not present

## 2024-06-04 LAB — CBC WITH DIFFERENTIAL/PLATELET
Abs Immature Granulocytes: 0.03 K/uL (ref 0.00–0.07)
Basophils Absolute: 0 K/uL (ref 0.0–0.1)
Basophils Relative: 0 %
Eosinophils Absolute: 0.2 K/uL (ref 0.0–0.5)
Eosinophils Relative: 2 %
HCT: 38 % (ref 36.0–46.0)
Hemoglobin: 12.2 g/dL (ref 12.0–15.0)
Immature Granulocytes: 0 %
Lymphocytes Relative: 25 %
Lymphs Abs: 2.5 K/uL (ref 0.7–4.0)
MCH: 28.3 pg (ref 26.0–34.0)
MCHC: 32.1 g/dL (ref 30.0–36.0)
MCV: 88.2 fL (ref 80.0–100.0)
Monocytes Absolute: 0.5 K/uL (ref 0.1–1.0)
Monocytes Relative: 5 %
Neutro Abs: 6.8 K/uL (ref 1.7–7.7)
Neutrophils Relative %: 68 %
Platelets: 156 K/uL (ref 150–400)
RBC: 4.31 MIL/uL (ref 3.87–5.11)
RDW: 14 % (ref 11.5–15.5)
WBC: 10.1 K/uL (ref 4.0–10.5)
nRBC: 0 % (ref 0.0–0.2)

## 2024-06-04 MED ORDER — TETANUS-DIPHTH-ACELL PERTUSSIS 5-2.5-18.5 LF-MCG/0.5 IM SUSY
0.5000 mL | PREFILLED_SYRINGE | Freq: Once | INTRAMUSCULAR | Status: AC
  Administered 2024-06-04: 0.5 mL via INTRAMUSCULAR
  Filled 2024-06-04: qty 0.5

## 2024-06-04 MED ORDER — FENTANYL CITRATE (PF) 100 MCG/2ML IJ SOLN
50.0000 ug | Freq: Once | INTRAMUSCULAR | Status: AC
  Administered 2024-06-04: 50 ug via INTRAVENOUS
  Filled 2024-06-04: qty 2

## 2024-06-04 MED ORDER — MIDAZOLAM HCL 2 MG/2ML IJ SOLN
2.0000 mg | Freq: Once | INTRAMUSCULAR | Status: AC
  Administered 2024-06-05: 2 mg via INTRAVENOUS
  Filled 2024-06-04: qty 2

## 2024-06-04 MED ORDER — LIDOCAINE-EPINEPHRINE-TETRACAINE (LET) TOPICAL GEL
3.0000 mL | Freq: Once | TOPICAL | Status: AC
  Administered 2024-06-04: 3 mL via TOPICAL
  Filled 2024-06-04: qty 3

## 2024-06-04 MED ORDER — LIDOCAINE-EPINEPHRINE (PF) 2 %-1:200000 IJ SOLN
10.0000 mL | Freq: Once | INTRAMUSCULAR | Status: AC
  Administered 2024-06-04: 10 mL via INTRADERMAL
  Filled 2024-06-04: qty 20
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
## 2024-06-05 LAB — COMPREHENSIVE METABOLIC PANEL WITH GFR
ALT: 23 U/L (ref 0–44)
AST: 23 U/L (ref 15–41)
Albumin: 4.3 g/dL (ref 3.5–5.0)
Alkaline Phosphatase: 65 U/L (ref 38–126)
Anion gap: 9 (ref 5–15)
BUN: 8 mg/dL (ref 6–20)
CO2: 22 mmol/L (ref 22–32)
Calcium: 8.9 mg/dL (ref 8.9–10.3)
Chloride: 109 mmol/L (ref 98–111)
Creatinine, Ser: 0.8 mg/dL (ref 0.44–1.00)
GFR, Estimated: >60 mL/min (ref >60)
Glucose, Bld: 99 mg/dL (ref 70–99)
Potassium: 3.6 mmol/L (ref 3.5–5.1)
Sodium: 140 mmol/L (ref 135–145)
Total Bilirubin: 0.2 mg/dL (ref 0.0–1.2)
Total Protein: 7.5 g/dL (ref 6.5–8.1)

## 2024-06-05 LAB — HCG, QUANTITATIVE, PREGNANCY: hCG, Beta Chain, Quant, S: <1 m[IU]/mL (ref <5)

## 2024-06-05 MED ORDER — IBUPROFEN 800 MG PO TABS: 800.0000 mg | ORAL_TABLET | Freq: Three times a day (TID) | ORAL | 0 refills | Status: DC

## 2024-06-05 MED ORDER — KETOROLAC TROMETHAMINE 30 MG/ML IJ SOLN
15.0000 mg | Freq: Once | INTRAMUSCULAR | Status: AC
  Administered 2024-06-05: 15 mg via INTRAVENOUS
  Filled 2024-06-05: qty 1

## 2024-06-05 MED ORDER — OXYCODONE-ACETAMINOPHEN 5-325 MG PO TABS: 1.0000 | ORAL_TABLET | Freq: Four times a day (QID) | ORAL | 0 refills | Status: DC | PRN

## 2024-06-06 MED FILL — Oxycodone w/ Acetaminophen Tab 5-325 MG: ORAL | Qty: 6 | Status: AC

## 2024-07-03 ENCOUNTER — Encounter: Payer: Self-pay | Admitting: Emergency Medicine

## 2024-07-03 ENCOUNTER — Emergency Department (HOSPITAL_COMMUNITY)
Admission: EM | Admit: 2024-07-03 | Discharge: 2024-07-03 | Discharge status: Against Medical Advice | Attending: Emergency Medicine | Admitting: Emergency Medicine

## 2024-07-03 ENCOUNTER — Encounter (HOSPITAL_COMMUNITY): Payer: Self-pay

## 2024-07-03 ENCOUNTER — Other Ambulatory Visit: Payer: Self-pay

## 2024-07-03 ENCOUNTER — Ambulatory Visit
Admission: EM | Admit: 2024-07-03 | Discharge: 2024-07-03 | Disposition: A | Discharge status: Home / Self Care | Attending: Nurse Practitioner | Admitting: Nurse Practitioner

## 2024-07-03 DIAGNOSIS — Z5321 Procedure and treatment not carried out due to patient leaving prior to being seen by health care provider: Secondary | ICD-10-CM | POA: Insufficient documentation

## 2024-07-03 DIAGNOSIS — J020 Streptococcal pharyngitis: Secondary | ICD-10-CM

## 2024-07-03 DIAGNOSIS — R059 Cough, unspecified: Secondary | ICD-10-CM | POA: Diagnosis present

## 2024-07-03 LAB — RESP PANEL BY RT-PCR (RSV, FLU A&B, COVID)  RVPGX2
Influenza A by PCR: NEGATIVE
Influenza B by PCR: NEGATIVE
Resp Syncytial Virus by PCR: NEGATIVE
SARS Coronavirus 2 by RT PCR: NEGATIVE

## 2024-07-03 LAB — GROUP A STREP BY PCR: Group A Strep by PCR: DETECTED — AB

## 2024-07-03 MED ORDER — AZITHROMYCIN 250 MG PO TABS: 250.0000 mg | ORAL_TABLET | Freq: Every day | ORAL | 0 refills | Status: DC

## 2024-07-03 MED ORDER — LIDOCAINE VISCOUS HCL 2 % MT SOLN: OROMUCOSAL | 0 refills | Status: DC

## 2024-07-03 MED ORDER — ACETAMINOPHEN 325 MG PO TABS: 650.0000 mg | ORAL_TABLET | Freq: Once | ORAL | Status: DC | PRN

## 2024-07-03 NOTE — Discharge Instructions (Addendum)
 Take medication as prescribed. Increase fluids and allow for plenty of rest. Recommend over-the-counter Tylenol  or Ibuprofen  as needed for pain, fever, or general discomfort. Warm salt water gargles 3-4 times daily to help with throat pain or discomfort.  You may also use over-the-counter Chloraseptic throat spray or throat lozenges while symptoms persist. Recommend a diet with soft foods to include soups, broths, puddings, yogurt, Jell-O's, or popsicles until symptoms improve. Change toothbrush after 3 days. If symptoms fail to improve, you may follow-up in this clinic or with your primary care physician for further evaluation. Follow-up as needed.

## 2024-07-03 NOTE — Discharge Instructions (Signed)

## 2024-07-03 NOTE — ED Provider Notes (Signed)
 [facility]    CSN: [id number] Arrival date & time: 07/03/24  1212      History   Chief Complaint Chief Complaint  Patient presents with   Sore Throat    HPI April Young is a 28 y.o. female.   The history is provided by the patient.   Patient presents for complaints of throat pain has been present for the past several days.  Patient denies fever, chills, headache, nasal congestion, runny nose, or cough.  States that she does have pain in the left ear.  She states that her symptoms initially improved but got worse over the past day or so.  Patient did go to [facility] emergency department this morning and was told she had strep throat, but left prior to receiving treatment.  Past Medical History:  Diagnosis Date   Abdominal pain affecting pregnancy 03/10/2017   Abnormal antibody titer 07/28/2022   07/28/22 Anti C anti D titer was 4    Asthma    Candida vaginitis 03/10/2017   Hx of trichomoniasis 11/2016   Rh negative status during pregnancy    Sepsis due to Escherichia coli (HCC) 03/2017   Urinary tract infection 03/10/2017    Patient Active Problem List   Diagnosis Date Noted   Chlamydia 01/05/2024   Encounter for initial prescription of injectable contraceptive 01/04/2024   Vaginal discharge 01/04/2024   Pregnancy examination or test, negative result 01/04/2024   Screening examination for STD (sexually transmitted disease) 01/04/2024   Iron  deficiency anemia due to chronic blood loss 12/31/2022   Family history of neoplasm of breast 01/28/2022    Past Surgical History:  Procedure Laterality Date   SKIN SURGERY     abcess    OB History     Gravida  2   Para  2   Term  2   Preterm  0   AB  0   Living  2      SAB  0   IAB      Ectopic      Multiple  0   Live Births  2            Home Medications    Prior to Admission medications   Medication Sig Start Date End Date Taking? Authorizing Provider  megestrol  (MEGACE )  40 MG tablet Take 3 tablets x 5 days then 2 x 5 days then 1 daily till bleeding has stopped 02/21/24   Signa Delon LABOR, NP  Blood Pressure Monitor MISC For regular home bp monitoring during pregnancy 09/07/22   Cresenzo-Dishmon, Cathlean, CNM  doxycycline  (VIBRA -TABS) 100 MG tablet Take 1 tablet (100 mg total) by mouth 2 (two) times daily. 01/05/24   Signa Delon LABOR, NP  ibuprofen  (ADVIL ) 800 MG tablet Take 1 tablet (800 mg total) by mouth 3 (three) times daily. 06/05/24   Mesner, Selinda, MD  medroxyPROGESTERone  (DEPO-PROVERA ) 150 MG/ML injection Inject 1 mL (150 mg total) into the muscle every 3 (three) months. 01/04/24   Signa Delon LABOR, NP  metroNIDAZOLE  (FLAGYL ) 500 MG tablet Take 1 tablet (500 mg total) by mouth 2 (two) times daily. 04/21/24   Signa Delon LABOR, NP  oxyCODONE -acetaminophen  (PERCOCET/ROXICET) 5-325 MG tablet Take 1 tablet by mouth every 6 (six) hours as needed for severe pain (pain score 7-10). 06/05/24   Mesner, Selinda, MD  sulfamethoxazole -trimethoprim  (BACTRIM  DS) 800-160 MG tablet Take 1 tablet by mouth 2 (two) times daily. Take 1 bid 04/21/24   Signa Delon LABOR, NP  Family History Family History  Problem Relation Age of Onset   COPD Maternal Grandmother    Asthma Daughter     Social History Social History   Tobacco Use   Smoking status: Some Days    Types: E-cigarettes   Smokeless tobacco: Never  Vaping Use   Vaping status: Some Days  Substance Use Topics   Alcohol use: No   Drug use: Yes    Types: Marijuana    Comment: every now and then2 weeks ago last use     Allergies   Penicillins   Review of Systems Review of Systems Per HPI  Physical Exam Triage Vital Signs ED Triage Vitals  Encounter Vitals Group     BP 07/03/24 1336 110/69     Girls Systolic BP Percentile --      Girls Diastolic BP Percentile --      Boys Systolic BP Percentile --      Boys Diastolic BP Percentile --      Pulse Rate 07/03/24 1336 84     Resp 07/03/24  1336 18     Temp 07/03/24 1336 99.1 F (37.3 C)     Temp Source 07/03/24 1336 Oral     SpO2 07/03/24 1336 97 %     Weight --      Height --      Head Circumference --      Peak Flow --      Pain Score 07/03/24 1335 10     Pain Loc --      Pain Education --      Exclude from Growth Chart --    No data found.  Updated Vital Signs BP 110/69 (BP Location: Right Arm)   Pulse 84   Temp 99.1 F (37.3 C) (Oral)   Resp 18   SpO2 97%   Breastfeeding No   Visual Acuity Right Eye Distance:   Left Eye Distance:   Bilateral Distance:    Right Eye Near:   Left Eye Near:    Bilateral Near:     Physical Exam Vitals and nursing note reviewed.  Constitutional:      General: She is not in acute distress.    Appearance: She is well-developed.  HENT:     Head: Normocephalic.     Right Ear: Tympanic membrane and ear canal normal.     Left Ear: Tympanic membrane and ear canal normal.     Nose: No congestion.     Mouth/Throat:     Mouth: Mucous membranes are moist.     Pharynx: Uvula midline. Pharyngeal swelling, posterior oropharyngeal erythema and uvula swelling present. No oropharyngeal exudate.     Tonsils: 1+ on the right. 1+ on the left.  Eyes:     Conjunctiva/sclera: Conjunctivae normal.     Pupils: Pupils are equal, round, and reactive to light.  Cardiovascular:     Rate and Rhythm: Normal rate and regular rhythm.     Heart sounds: Normal heart sounds.  Pulmonary:     Effort: Pulmonary effort is normal. No respiratory distress.     Breath sounds: Normal breath sounds. No stridor. No wheezing, rhonchi or rales.  Abdominal:     General: Bowel sounds are normal.     Palpations: Abdomen is soft.     Tenderness: There is no abdominal tenderness.  Musculoskeletal:     Cervical back: Normal range of motion.  Lymphadenopathy:     Cervical: No cervical adenopathy.  Skin:    General: Skin  is warm and dry.  Neurological:     General: No focal deficit present.     Mental  Status: She is alert and oriented to person, place, and time.  Psychiatric:        Mood and Affect: Mood normal.        Behavior: Behavior normal.      UC Treatments / Results  Labs (all labs ordered are listed, but only abnormal results are displayed) Labs Reviewed - No data to display  EKG   Radiology No results found.  Procedures Procedures (including critical care time)  Medications Ordered in UC Medications - No data to display  Initial Impression / Assessment and Plan / UC Course  I have reviewed the triage vital signs and the nursing notes.  Pertinent labs & imaging results that were available during my care of the patient were reviewed by me and considered in my medical decision making (see chart for details).  Per review of the patient's chart, rapid strep test was positive in the emergency department.  Will start patient on azithromycin  as she is allergic to penicillin.  Viscous lidocaine  2% also prescribed for patient to gargle and spit for throat pain or discomfort.  Supportive care recommendations were provided and discussed with the patient to include fluids, rest, over-the-counter analgesics, warm salt water gargles, and use of Chloraseptic throat spray.  Discussed indications with patient regarding follow-up.  Patient was in agreement with this plan of care and verbalizes understanding.  All questions were answered.  Patient stable for discharge.  Work note was provided.    Final Clinical Impressions(s) / UC Diagnoses   Final diagnoses:  None   Discharge Instructions   None    ED Prescriptions   None    PDMP not reviewed this encounter.   Gilmer Etta PARAS, NP 07/03/24 1353

## 2024-07-03 NOTE — ED Triage Notes (Signed)
 Pt was seen at ED this am for same and was told had strep throat. Pt denies any known abd pain, fever. Pt reports left prior to receiving treatment related to diagnosis.

## 2024-07-03 NOTE — ED Triage Notes (Signed)
 Pt c/o coughing and sore throat 3 days ago. Pt states taking ibuprofen  and tylenol . Pt denies fever. Pt states son had a ear infection currently.

## 2024-07-11 ENCOUNTER — Ambulatory Visit

## 2024-10-15 ENCOUNTER — Emergency Department (HOSPITAL_COMMUNITY)
Admission: EM | Admit: 2024-10-15 | Discharge: 2024-10-15 | Disposition: A | Discharge status: Home / Self Care | Attending: Emergency Medicine | Admitting: Emergency Medicine

## 2024-10-15 ENCOUNTER — Encounter (HOSPITAL_COMMUNITY): Payer: Self-pay | Admitting: *Deleted

## 2024-10-15 ENCOUNTER — Other Ambulatory Visit: Payer: Self-pay

## 2024-10-15 DIAGNOSIS — N3000 Acute cystitis without hematuria: Secondary | ICD-10-CM | POA: Insufficient documentation

## 2024-10-15 DIAGNOSIS — R3915 Urgency of urination: Secondary | ICD-10-CM | POA: Diagnosis present

## 2024-10-15 LAB — URINALYSIS, ROUTINE W REFLEX MICROSCOPIC
Bilirubin Urine: NEGATIVE
Glucose, UA: NEGATIVE mg/dL
Hgb urine dipstick: NEGATIVE
Ketones, ur: NEGATIVE mg/dL
Nitrite: POSITIVE — AB
Protein, ur: >=300 mg/dL — AB
Specific Gravity, Urine: 1.018 (ref 1.005–1.030)
WBC, UA: >50 WBC/hpf (ref 0–5)
pH: 7 (ref 5.0–8.0)

## 2024-10-15 LAB — PREGNANCY, URINE: Preg Test, Ur: NEGATIVE

## 2024-10-15 MED ORDER — NITROFURANTOIN MONOHYD MACRO 100 MG PO CAPS: 100.0000 mg | ORAL_CAPSULE | Freq: Two times a day (BID) | ORAL | 0 refills | Status: DC

## 2024-10-15 MED ORDER — NAPROXEN 500 MG PO TABS: 500.0000 mg | ORAL_TABLET | Freq: Two times a day (BID) | ORAL | 0 refills | Status: AC

## 2024-10-15 NOTE — ED Notes (Signed)
 Pt given soda and warm blanket

## 2024-10-15 NOTE — Discharge Instructions (Addendum)
 Your testing shows that you do have a bladder infection, take Macrobid  twice a day for 5 days, Naprosyn  twice a day as needed for pain, see your doctor if no better in 3 days, ER for worsening symptoms including fevers vomiting or severe pain

## 2024-10-15 NOTE — ED Provider Notes (Signed)
 " [place] EMERGENCY DEPARTMENT AT [facility] Provider Note   CSN: [id number] Arrival date & time: 10/15/24  9047     Patient presents with: Back Pain   April Young is a 29 y.o. female.    Back Pain  This patient is a 29 year old female who has had back pain for a couple of days mostly in the right side, it is not exacerbated by movement but has noticed that she has had frequent urinary urgency, frequency sometimes passing very little urine and it has been cloudy.  No fevers chills nausea or vomiting, last urinary infection was about a year ago when she was pregnant.    Prior to Admission medications  Medication Sig Start Date End Date Taking? Authorizing Provider  naproxen  (NAPROSYN ) 500 MG tablet Take 1 tablet (500 mg total) by mouth 2 (two) times daily with a meal. 10/15/24  Yes Cleotilde Rogue, MD  nitrofurantoin , macrocrystal-monohydrate, (MACROBID ) 100 MG capsule Take 1 capsule (100 mg total) by mouth 2 (two) times daily. 10/15/24  Yes Cleotilde Rogue, MD  Blood Pressure Monitor MISC For regular home bp monitoring during pregnancy 09/07/22   Cresenzo-Dishmon, Cathlean, CNM  medroxyPROGESTERone  (DEPO-PROVERA ) 150 MG/ML injection Inject 1 mL (150 mg total) into the muscle every 3 (three) months. 01/04/24   Signa Delon LABOR, NP    Allergies: Penicillins    Review of Systems  Musculoskeletal:  Positive for back pain.  All other systems reviewed and are negative.   Updated Vital Signs BP 97/63 (BP Location: Right Arm)   Pulse 61   Temp 98.1 F (36.7 C) (Oral)   Resp 16   Ht 1.727 m (5' 8)   Wt 49.9 kg   SpO2 100%   BMI 16.73 kg/m   Physical Exam Vitals and nursing note reviewed.  Constitutional:      General: She is not in acute distress.    Appearance: She is well-developed.  HENT:     Head: Normocephalic and atraumatic.     Mouth/Throat:     Pharynx: No oropharyngeal exudate.  Eyes:     General: No scleral icterus.       Right eye: No discharge.         Left eye: No discharge.     Conjunctiva/sclera: Conjunctivae normal.     Pupils: Pupils are equal, round, and reactive to light.  Neck:     Thyroid: No thyromegaly.     Vascular: No JVD.  Cardiovascular:     Rate and Rhythm: Normal rate and regular rhythm.     Heart sounds: Normal heart sounds. No murmur heard.    No friction rub. No gallop.  Pulmonary:     Effort: Pulmonary effort is normal. No respiratory distress.     Breath sounds: Normal breath sounds. No wheezing or rales.  Abdominal:     General: Bowel sounds are normal. There is no distension.     Palpations: Abdomen is soft. There is no mass.     Tenderness: There is no abdominal tenderness.  Musculoskeletal:        General: No tenderness. Normal range of motion.     Cervical back: Normal range of motion and neck supple.     Comments: There is no reproducible tenderness across the spine or the bilateral back and there is no CVA tenderness  Lymphadenopathy:     Cervical: No cervical adenopathy.  Skin:    General: Skin is warm and dry.     Findings: No erythema  or rash.  Neurological:     Mental Status: She is alert.     Coordination: Coordination normal.  Psychiatric:        Behavior: Behavior normal.     (all labs ordered are listed, but only abnormal results are displayed) Labs Reviewed  URINALYSIS, ROUTINE W REFLEX MICROSCOPIC - Abnormal; Notable for the following components:      Result Value   Color, Urine AMBER (*)    APPearance CLOUDY (*)    Protein, ur >=300 (*)    Nitrite POSITIVE (*)    Leukocytes,Ua LARGE (*)    Bacteria, UA MANY (*)    All other components within normal limits  PREGNANCY, URINE    EKG: None  Radiology: No results found.   Procedures   Medications Ordered in the ED - No data to display                                  Medical Decision Making Amount and/or Complexity of Data Reviewed Labs: ordered.  Risk Prescription drug management.   The patient is not  ill-appearing, vital signs are unremarkable, she has no abdominal tenderness nor does she have any reproducible back tenderness and is not having any symptoms in her back right now.  Given the urinary frequency and urgency will obtain a urinalysis and a pregnancy test, make sure there is no signs of pyelonephritis or cystitis, the patient does not systemically or clinically ill-appearing and has no back pain at this time.  Labs:  I  personally viewed and interpreted the labs which show UTI with bacteria and WBC present, preg neg  No signs of sepsis, No back pain at this time Treatment for cystitis   I have discussed with the patient at the bedside the results, and the meaning of these results.  They have had opportunity to ask questions,  expressed their understanding to the need for follow-up with primary care physician     Final diagnoses:  Acute cystitis without hematuria    ED Discharge Orders          Ordered    nitrofurantoin , macrocrystal-monohydrate, (MACROBID ) 100 MG capsule  2 times daily        10/15/24 1126    naproxen  (NAPROSYN ) 500 MG tablet  2 times daily with meals        10/15/24 1126               Cleotilde Rogue, MD 10/15/24 1127  "

## 2024-10-15 NOTE — ED Triage Notes (Signed)
 Pt c/o right side back pain x one week; pt states when she urinates it is very little and feels like she needs to urinate more frequently

## 2024-10-17 ENCOUNTER — Observation Stay (HOSPITAL_COMMUNITY)

## 2024-10-17 ENCOUNTER — Other Ambulatory Visit: Payer: Self-pay

## 2024-10-17 ENCOUNTER — Inpatient Hospital Stay (HOSPITAL_COMMUNITY)
Admission: EM | Admit: 2024-10-17 | Discharge: 2024-10-20 | DRG: 854 | Disposition: A | Discharge status: Home / Self Care | Attending: Internal Medicine | Admitting: Internal Medicine

## 2024-10-17 ENCOUNTER — Encounter (HOSPITAL_COMMUNITY): Payer: Self-pay | Admitting: Emergency Medicine

## 2024-10-17 DIAGNOSIS — F439 Reaction to severe stress, unspecified: Secondary | ICD-10-CM | POA: Diagnosis present

## 2024-10-17 DIAGNOSIS — A419 Sepsis, unspecified organism: Principal | ICD-10-CM | POA: Diagnosis present

## 2024-10-17 DIAGNOSIS — I959 Hypotension, unspecified: Secondary | ICD-10-CM | POA: Diagnosis present

## 2024-10-17 DIAGNOSIS — R10A1 Flank pain, right side: Secondary | ICD-10-CM | POA: Diagnosis not present

## 2024-10-17 DIAGNOSIS — Z8619 Personal history of other infectious and parasitic diseases: Secondary | ICD-10-CM

## 2024-10-17 DIAGNOSIS — Z3202 Encounter for pregnancy test, result negative: Secondary | ICD-10-CM

## 2024-10-17 DIAGNOSIS — R10A0 Flank pain, unspecified side: Secondary | ICD-10-CM | POA: Diagnosis present

## 2024-10-17 DIAGNOSIS — D6959 Other secondary thrombocytopenia: Secondary | ICD-10-CM | POA: Diagnosis present

## 2024-10-17 DIAGNOSIS — D72829 Elevated white blood cell count, unspecified: Secondary | ICD-10-CM | POA: Diagnosis not present

## 2024-10-17 DIAGNOSIS — E876 Hypokalemia: Secondary | ICD-10-CM | POA: Diagnosis present

## 2024-10-17 DIAGNOSIS — N390 Urinary tract infection, site not specified: Secondary | ICD-10-CM | POA: Diagnosis present

## 2024-10-17 DIAGNOSIS — Z825 Family history of asthma and other chronic lower respiratory diseases: Secondary | ICD-10-CM

## 2024-10-17 DIAGNOSIS — N10 Acute tubulo-interstitial nephritis: Secondary | ICD-10-CM | POA: Insufficient documentation

## 2024-10-17 DIAGNOSIS — R739 Hyperglycemia, unspecified: Secondary | ICD-10-CM | POA: Diagnosis not present

## 2024-10-17 DIAGNOSIS — J45909 Unspecified asthma, uncomplicated: Secondary | ICD-10-CM | POA: Diagnosis present

## 2024-10-17 DIAGNOSIS — R636 Underweight: Secondary | ICD-10-CM | POA: Diagnosis present

## 2024-10-17 DIAGNOSIS — Z87442 Personal history of urinary calculi: Secondary | ICD-10-CM

## 2024-10-17 DIAGNOSIS — N202 Calculus of kidney with calculus of ureter: Secondary | ICD-10-CM | POA: Diagnosis present

## 2024-10-17 DIAGNOSIS — Z79899 Other long term (current) drug therapy: Secondary | ICD-10-CM

## 2024-10-17 DIAGNOSIS — R809 Proteinuria, unspecified: Secondary | ICD-10-CM | POA: Diagnosis not present

## 2024-10-17 DIAGNOSIS — A4159 Other Gram-negative sepsis: Secondary | ICD-10-CM

## 2024-10-17 DIAGNOSIS — N3000 Acute cystitis without hematuria: Secondary | ICD-10-CM | POA: Diagnosis present

## 2024-10-17 DIAGNOSIS — F17290 Nicotine dependence, other tobacco product, uncomplicated: Secondary | ICD-10-CM | POA: Diagnosis present

## 2024-10-17 DIAGNOSIS — Z793 Long term (current) use of hormonal contraceptives: Secondary | ICD-10-CM

## 2024-10-17 DIAGNOSIS — Z880 Allergy status to penicillin: Secondary | ICD-10-CM

## 2024-10-17 DIAGNOSIS — Z681 Body mass index (BMI) 19 or less, adult: Secondary | ICD-10-CM

## 2024-10-17 DIAGNOSIS — N3001 Acute cystitis with hematuria: Principal | ICD-10-CM

## 2024-10-17 DIAGNOSIS — N136 Pyonephrosis: Secondary | ICD-10-CM | POA: Diagnosis present

## 2024-10-17 DIAGNOSIS — R112 Nausea with vomiting, unspecified: Secondary | ICD-10-CM | POA: Diagnosis present

## 2024-10-17 DIAGNOSIS — N201 Calculus of ureter: Secondary | ICD-10-CM | POA: Insufficient documentation

## 2024-10-17 LAB — CBC WITH DIFFERENTIAL/PLATELET
Abs Immature Granulocytes: 0.14 K/uL — ABNORMAL HIGH (ref 0.00–0.07)
Basophils Absolute: 0 K/uL (ref 0.0–0.1)
Basophils Relative: 0 %
Eosinophils Absolute: 0 K/uL (ref 0.0–0.5)
Eosinophils Relative: 0 %
HCT: 38.3 % (ref 36.0–46.0)
Hemoglobin: 12.3 g/dL (ref 12.0–15.0)
Immature Granulocytes: 1 %
Lymphocytes Relative: 4 %
Lymphs Abs: 0.6 K/uL — ABNORMAL LOW (ref 0.7–4.0)
MCH: 28.4 pg (ref 26.0–34.0)
MCHC: 32.1 g/dL (ref 30.0–36.0)
MCV: 88.5 fL (ref 80.0–100.0)
Monocytes Absolute: 1.1 K/uL — ABNORMAL HIGH (ref 0.1–1.0)
Monocytes Relative: 6 %
Neutro Abs: 15.8 K/uL — ABNORMAL HIGH (ref 1.7–7.7)
Neutrophils Relative %: 89 %
Platelets: 107 K/uL — ABNORMAL LOW (ref 150–400)
RBC: 4.33 MIL/uL (ref 3.87–5.11)
RDW: 13.3 % (ref 11.5–15.5)
WBC: 17.7 K/uL — ABNORMAL HIGH (ref 4.0–10.5)
nRBC: 0 % (ref 0.0–0.2)

## 2024-10-17 LAB — HEMOGLOBIN A1C
Hgb A1c MFr Bld: 5 % (ref 4.8–5.6)
Mean Plasma Glucose: 96.8 mg/dL

## 2024-10-17 LAB — COMPREHENSIVE METABOLIC PANEL WITH GFR
ALT: 7 U/L (ref 0–44)
AST: 19 U/L (ref 15–41)
Albumin: 4.4 g/dL (ref 3.5–5.0)
Alkaline Phosphatase: 103 U/L (ref 38–126)
Anion gap: 12 (ref 5–15)
BUN: 11 mg/dL (ref 6–20)
CO2: 25 mmol/L (ref 22–32)
Calcium: 8.9 mg/dL (ref 8.9–10.3)
Chloride: 100 mmol/L (ref 98–111)
Creatinine, Ser: 0.87 mg/dL (ref 0.44–1.00)
GFR, Estimated: >60 mL/min
Glucose, Bld: 143 mg/dL — ABNORMAL HIGH (ref 70–99)
Potassium: 3.2 mmol/L — ABNORMAL LOW (ref 3.5–5.1)
Sodium: 137 mmol/L (ref 135–145)
Total Bilirubin: 0.5 mg/dL (ref 0.0–1.2)
Total Protein: 7.7 g/dL (ref 6.5–8.1)

## 2024-10-17 LAB — URINE DRUG SCREEN
Amphetamines: NEGATIVE
Barbiturates: NEGATIVE
Benzodiazepines: NEGATIVE
Cocaine: NEGATIVE
Fentanyl: NEGATIVE
Methadone Scn, Ur: NEGATIVE
Opiates: NEGATIVE
Tetrahydrocannabinol: POSITIVE — AB

## 2024-10-17 LAB — HIV ANTIBODY (ROUTINE TESTING W REFLEX): HIV Screen 4th Generation wRfx: NONREACTIVE

## 2024-10-17 MED ORDER — SODIUM CHLORIDE 0.9 % IV SOLN
2.0000 g | INTRAVENOUS | Status: DC
  Administered 2024-10-18 – 2024-10-20 (×3): 2 g via INTRAVENOUS
  Filled 2024-10-17 (×3): qty 20

## 2024-10-17 MED ORDER — KETOROLAC TROMETHAMINE 30 MG/ML IJ SOLN
30.0000 mg | Freq: Once | INTRAMUSCULAR | Status: AC
  Administered 2024-10-17: 30 mg via INTRAVENOUS
  Filled 2024-10-17: qty 1

## 2024-10-17 MED ORDER — IOHEXOL 300 MG/ML  SOLN
75.0000 mL | Freq: Once | INTRAMUSCULAR | Status: AC | PRN
  Administered 2024-10-17: 75 mL via INTRAVENOUS

## 2024-10-17 MED ORDER — PROCHLORPERAZINE EDISYLATE 10 MG/2ML IJ SOLN: 10.0000 mg | INTRAMUSCULAR | Status: DC | PRN

## 2024-10-17 MED ORDER — POTASSIUM CHLORIDE 2 MEQ/ML IV SOLN
INTRAVENOUS | Status: AC
  Filled 2024-10-17 (×3): qty 1000

## 2024-10-17 MED ORDER — ACETAMINOPHEN 500 MG PO TABS
1000.0000 mg | ORAL_TABLET | Freq: Four times a day (QID) | ORAL | Status: DC
  Administered 2024-10-17 – 2024-10-20 (×5): 1000 mg via ORAL
  Filled 2024-10-17 (×7): qty 2

## 2024-10-17 MED ORDER — BISACODYL 5 MG PO TBEC: 5.0000 mg | DELAYED_RELEASE_TABLET | Freq: Every day | ORAL | Status: DC | PRN

## 2024-10-17 MED ORDER — NICOTINE 14 MG/24HR TD PT24: 14.0000 mg | MEDICATED_PATCH | Freq: Every day | TRANSDERMAL | Status: DC | PRN

## 2024-10-17 MED ORDER — ACETAMINOPHEN 650 MG RE SUPP: 650.0000 mg | Freq: Four times a day (QID) | RECTAL | Status: DC

## 2024-10-17 MED ORDER — SODIUM CHLORIDE 0.9 % IV BOLUS
1000.0000 mL | Freq: Once | INTRAVENOUS | Status: AC
  Administered 2024-10-17: 1000 mL via INTRAVENOUS

## 2024-10-17 MED ORDER — ENSURE PLUS HIGH PROTEIN PO LIQD
237.0000 mL | Freq: Two times a day (BID) | ORAL | Status: DC
  Administered 2024-10-17 – 2024-10-20 (×4): 237 mL via ORAL

## 2024-10-17 MED ORDER — ONDANSETRON HCL 4 MG/2ML IJ SOLN: 4.0000 mg | Freq: Once | INTRAMUSCULAR | Status: DC

## 2024-10-17 MED ORDER — ONDANSETRON HCL 4 MG/2ML IJ SOLN
4.0000 mg | Freq: Four times a day (QID) | INTRAMUSCULAR | Status: DC | PRN
  Administered 2024-10-17 – 2024-10-18 (×2): 4 mg via INTRAVENOUS
  Filled 2024-10-17 (×2): qty 2

## 2024-10-17 MED ORDER — ONDANSETRON HCL 4 MG/2ML IJ SOLN
4.0000 mg | Freq: Once | INTRAMUSCULAR | Status: AC
  Administered 2024-10-17: 4 mg via INTRAVENOUS
  Filled 2024-10-17: qty 2

## 2024-10-17 MED ORDER — ONDANSETRON HCL 4 MG PO TABS: 4.0000 mg | ORAL_TABLET | Freq: Four times a day (QID) | ORAL | Status: DC | PRN

## 2024-10-17 MED ORDER — POTASSIUM CHLORIDE 10 MEQ/100ML IV SOLN
10.0000 meq | Freq: Once | INTRAVENOUS | Status: AC
  Administered 2024-10-17: 10 meq via INTRAVENOUS
  Filled 2024-10-17: qty 100

## 2024-10-17 MED ORDER — TRAZODONE HCL 50 MG PO TABS
25.0000 mg | ORAL_TABLET | Freq: Every evening | ORAL | Status: DC | PRN
  Administered 2024-10-17: 25 mg via ORAL
  Filled 2024-10-17: qty 1

## 2024-10-17 MED ORDER — SODIUM CHLORIDE 0.9 % IV SOLN
2.0000 g | Freq: Once | INTRAVENOUS | Status: AC
  Administered 2024-10-17: 2 g via INTRAVENOUS
  Filled 2024-10-17: qty 20

## 2024-10-17 MED ORDER — KETOROLAC TROMETHAMINE 15 MG/ML IJ SOLN: 15.0000 mg | Freq: Four times a day (QID) | INTRAMUSCULAR | Status: DC | PRN

## 2024-10-17 MED ORDER — ENOXAPARIN SODIUM 30 MG/0.3ML IJ SOSY
30.0000 mg | PREFILLED_SYRINGE | INTRAMUSCULAR | Status: DC
  Administered 2024-10-17: 30 mg via SUBCUTANEOUS
  Filled 2024-10-17: qty 0.3

## 2024-10-17 NOTE — Hospital Course (Addendum)
 29 year old female with no documented chronic medical problems recently seen in the [facility] ED on 10/15/2024 complaining of back pain and flank pain on the right side and urinary frequency and cloudiness was diagnosed with a UTI and treated with Macrobid .  She returned to the ED 10/17/24 complaining of ongoing nausea and vomiting and back pain/flank pain and overall malaise.  She denies fever and chills.  She was noted to be hypokalemic and blood glucose elevated at 143.  Her white blood cell count was elevated at 17.7.  Platelets were down to 107.  She is being admitted to the hospital for ongoing treatment of this UTI that has failed outpatient management.. CT abdomen and pelvis showed 5 mm right renal calculus in the right UVJ.  Urology was consulted.  The patient was started on ceftriaxone .  The patient underwent right ureteral stent placement performed by Dr. [name].  She improved clinically with resolution of her sepsis criteria.  She was continued on ceftriaxone  pending culture data.  She was ultimately discharged home with cefadroxil  for 10 additional days to complete 2 weeks of therapy.

## 2024-10-17 NOTE — H&P (Addendum)
 " History and Physical  [facility]  April Young April Young:[id number] DOB: Nov 24, 1995 DOA: 10/17/2024  PCP: Patient, No Pcp Per  Patient coming from: Home  Level of care: Med-Surg  I have personally briefly reviewed patient's old medical records in [facility]  Chief Complaint: n/v, back pain  HPI: April Young is a 29 year old female recently seen in the [facility] ED on 10/15/2024 complaining of back pain and flank pain on the right side and urinary frequency and cloudiness was diagnosed with a UTI and treated with Macrobid .  She returned to the ED today complaining of ongoing nausea and vomiting and back pain/flank pain and overall malaise.  She denies fever and chills.  She was noted to be hypokalemic and blood glucose elevated at 143.  Her white blood cell count was elevated at 17.7.  Platelets were down to 107.  She is being admitted to the hospital for ongoing treatment of this UTI that has failed outpatient management.    Past Medical History:  Diagnosis Date   Abdominal pain affecting pregnancy 03/10/2017   Abnormal antibody titer 07/28/2022   07/28/22 Anti C anti D titer was 4    Asthma    Candida vaginitis 03/10/2017   Hx of trichomoniasis 11/2016   Rh negative status during pregnancy    Sepsis due to Escherichia coli (HCC) 03/2017   Urinary tract infection 03/10/2017    Past Surgical History:  Procedure Laterality Date   SKIN SURGERY     abcess     reports that she has been smoking e-cigarettes. She has never used smokeless tobacco. She reports current drug use. Drug: Marijuana. She reports that she does not drink alcohol.  Allergies[1]  Family History  Problem Relation Age of Onset   COPD Maternal Grandmother    Asthma Daughter     Prior to Admission medications  Medication Sig Start Date End Date Taking? Authorizing Provider  Blood Pressure Monitor MISC For regular home bp monitoring during pregnancy 09/07/22   Cresenzo-Dishmon, Cathlean, CNM   medroxyPROGESTERone  (DEPO-PROVERA ) 150 MG/ML injection Inject 1 mL (150 mg total) into the muscle every 3 (three) months. 01/04/24   Signa Delon LABOR, NP  naproxen  (NAPROSYN ) 500 MG tablet Take 1 tablet (500 mg total) by mouth 2 (two) times daily with a meal. 10/15/24   Cleotilde Rogue, MD  nitrofurantoin , macrocrystal-monohydrate, (MACROBID ) 100 MG capsule Take 1 capsule (100 mg total) by mouth 2 (two) times daily. 10/15/24   Cleotilde Rogue, MD    Physical Exam: Vitals:   10/17/24 1100 10/17/24 1215 10/17/24 1247 10/17/24 1248  BP: 109/70 (!) 89/57  (!) 93/55  Pulse: 83 91    Resp: 16 15    Temp:   98.5 F (36.9 C)   TempSrc:   Oral   SpO2: 99% 99%    Weight:      Height:        Constitutional: pt appears ill, curled up in fetal position, NAD, calm, comfortable Eyes: PERRL, lids and conjunctivae normal ENMT: Mucous membranes are dry.  Posterior pharynx clear of any exudate or lesions.Normal dentition.  Neck: normal, supple, no masses, no thyromegaly Respiratory: clear to auscultation bilaterally, no wheezing, no crackles. Normal respiratory effort. No accessory muscle use.  Cardiovascular: normal s1, s2 sounds, no murmurs / rubs / gallops. No extremity edema. 2+ pedal pulses. No carotid bruits.  Abdomen: right flank pain and tenderness, no masses palpated. No hepatosplenomegaly. Bowel sounds positive.  Musculoskeletal: no clubbing / cyanosis. No joint  deformity upper and lower extremities. Good ROM, no contractures. Normal muscle tone.  Skin: no rashes, lesions, ulcers. No induration Neurologic: CN 2-12 grossly intact. Sensation intact, DTR normal. Strength 5/5 in all 4.  Psychiatric: Normal judgment and insight. Alert and oriented x 3. Normal mood.   Labs on Admission: I have personally reviewed following labs and imaging studies  CBC: Recent Labs  Lab 10/17/24 0926  WBC 17.7*  NEUTROABS 15.8*  HGB 12.3  HCT 38.3  MCV 88.5  PLT 107*   Basic Metabolic Panel: Recent Labs   Lab 10/17/24 0926  NA 137  K 3.2*  CL 100  CO2 25  GLUCOSE 143*  BUN 11  CREATININE 0.87  CALCIUM  8.9   GFR: Estimated Creatinine Clearance: 76.5 mL/min (by C-G formula based on SCr of 0.87 mg/dL). Liver Function Tests: Recent Labs  Lab 10/17/24 0926  AST 19  ALT 7  ALKPHOS 103  BILITOT 0.5  PROT 7.7  ALBUMIN 4.4   No results for input(s): LIPASE, AMYLASE in the last 168 hours. No results for input(s): AMMONIA in the last 168 hours. Coagulation Profile: No results for input(s): INR, PROTIME in the last 168 hours. Cardiac Enzymes: No results for input(s): CKTOTAL, CKMB, CKMBINDEX, TROPONINI in the last 168 hours. BNP (last 3 results) No results for input(s): PROBNP in the last 8760 hours. HbA1C: No results for input(s): HGBA1C in the last 72 hours. CBG: No results for input(s): GLUCAP in the last 168 hours. Lipid Profile: No results for input(s): CHOL, HDL, LDLCALC, TRIG, CHOLHDL, LDLDIRECT in the last 72 hours. Thyroid Function Tests: No results for input(s): TSH, T4TOTAL, FREET4, T3FREE, THYROIDAB in the last 72 hours. Anemia Panel: No results for input(s): VITAMINB12, FOLATE, FERRITIN, TIBC, IRON , RETICCTPCT in the last 72 hours. Urine analysis:    Component Value Date/Time   COLORURINE AMBER (A) 10/15/2024 1016   APPEARANCEUR CLOUDY (A) 10/15/2024 1016   LABSPEC 1.018 10/15/2024 1016   PHURINE 7.0 10/15/2024 1016   GLUCOSEU NEGATIVE 10/15/2024 1016   HGBUR NEGATIVE 10/15/2024 1016   BILIRUBINUR NEGATIVE 10/15/2024 1016   KETONESUR NEGATIVE 10/15/2024 1016   PROTEINUR >=300 (A) 10/15/2024 1016   UROBILINOGEN 1.0 02/06/2015 0948   NITRITE POSITIVE (A) 10/15/2024 1016   LEUKOCYTESUR LARGE (A) 10/15/2024 1016    Radiological Exams on Admission: No results found.   Assessment/Plan Principal Problem:   Acute cystitis Active Problems:   UTI (urinary tract infection)   Flank pain    Hypotension   Leukocytosis   Pregnancy examination or test, negative result   Nausea and vomiting   Hyperglycemia   Hypokalemia   Proteinuria   UTI and Cystitis -- I worry about pyelonephritis given ongoing nausea, vomiting and flank pain  -- CT abdomen/pelvis ordered to evaluate for pyelonephritis  -- unfortunately blood cultures were not collected prior to antibiotics -- continue IV ceftriaxone  2 grams every 24 hours -- urine culture pending-- follow results -- supportive care with IV fluids, IV nausea and pain medication ordered -- urine pregnancy test negative   Leukocytosis -- secondary to UA -- rule out pyelonephritis -- check CBC/diff in AM  Hypokalemia -- IV replacement ordered -- check Mg in AM    Hypotension -- secondary to poor oral intake and nausea and vomiting  -- continue IV fluid hydration   Nausea and vomiting -- ordered for IV zofran  and compazine  for breakthru n/v symptoms   Hyperglycemia -- presumably due to stress reaction  -- follow up A1c testing   History  of recreational substance use -- UDS ordered for completion  DVT prophylaxis: enoxaparin    Code Status: Full   Family Communication:   Disposition Plan: anticipate home  Consults called:   Admission status: OBV Time spent: 58 mins   Level of care: Med-Surg Afton Louder MD [facility] How to contact the [facility] Attending or Consulting provider 7A - 7P or covering provider during after hours 7P -7A, for this patient?  Check the care team in [facility] and look for a) attending/consulting [facility] provider listed and b) the [facility] team listed Log into [email or web address] and use [place]'s universal password to access. If you do not have the password, please contact the hospital operator. Locate the [facility] provider you are looking for under [facility] and page to a number that you can be directly reached. If you still have difficulty reaching the provider, please page the [facility] (Director on Call)  for the Hospitalists listed on amion for assistance.   If 7PM-7AM, please contact night-coverage [email or web address] Password [facility]  10/17/2024, 1:04 PM        [1]  Allergies Allergen Reactions   Penicillins Rash and Other (See Comments)    Has patient had a PCN reaction causing immediate rash, facial/tongue/throat swelling, SOB or lightheadedness with hypotension: No Has patient had a PCN reaction causing severe rash involving mucus membranes or skin necrosis: No Has patient had a PCN reaction that required hospitalization: No Has patient had a PCN reaction occurring within the last 10 years: No If all of the above answers are NO, then may proceed with Cephalosporin use.   "

## 2024-10-17 NOTE — ED Triage Notes (Signed)
 Pt ambulatory to triage. Pt c/o nausea and vomiting since yesterday.  Pt endorses back pain.  Pt currently on abx for UTI.

## 2024-10-17 NOTE — ED Provider Notes (Signed)
 " [facility] MEDICAL SURGICAL UNIT Provider Note   CSN: [id number] Arrival date & time: 10/17/24  9181     Patient presents with: Nausea and Emesis   April Young is a 29 y.o. female.   Patient started treatment for UTI couple days ago and she has been vomiting ever since.  Patient complains of right flank pain  The history is provided by the patient and medical records.  Emesis Severity:  Moderate Timing:  Constant Quality:  Bilious material Able to tolerate:  Liquids Progression:  Worsening Chronicity:  New Recent urination:  Normal Context: not post-tussive   Relieved by:  Nothing Associated symptoms: abdominal pain   Associated symptoms: no cough, no diarrhea and no headaches        Prior to Admission medications  Medication Sig Start Date End Date Taking? Authorizing Provider  naproxen  (NAPROSYN ) 500 MG tablet Take 1 tablet (500 mg total) by mouth 2 (two) times daily with a meal. 10/15/24  Yes Cleotilde Rogue, MD  nitrofurantoin , macrocrystal-monohydrate, (MACROBID ) 100 MG capsule Take 1 capsule (100 mg total) by mouth 2 (two) times daily. 10/15/24  Yes Cleotilde Rogue, MD  Blood Pressure Monitor MISC For regular home bp monitoring during pregnancy 09/07/22   Cresenzo-Dishmon, Cathlean, CNM  medroxyPROGESTERone  (DEPO-PROVERA ) 150 MG/ML injection Inject 1 mL (150 mg total) into the muscle every 3 (three) months. 01/04/24   Signa Delon LABOR, NP    Allergies: Penicillins    Review of Systems  Constitutional:  Negative for appetite change and fatigue.  HENT:  Negative for congestion, ear discharge and sinus pressure.   Eyes:  Negative for discharge.  Respiratory:  Negative for cough.   Cardiovascular:  Negative for chest pain.  Gastrointestinal:  Positive for abdominal pain and vomiting. Negative for diarrhea.  Genitourinary:  Negative for frequency and hematuria.  Musculoskeletal:  Negative for back pain.  Skin:  Negative for rash.  Neurological:  Negative for  seizures and headaches.  Psychiatric/Behavioral:  Negative for hallucinations.     Updated Vital Signs BP 108/65 (BP Location: Right Arm)   Pulse 95   Temp 98.9 F (37.2 C) (Oral)   Resp 17   Ht 5' 8 (1.727 m)   Wt 49.9 kg   SpO2 100%   BMI 16.73 kg/m   Physical Exam Vitals and nursing note reviewed.  Constitutional:      Appearance: She is well-developed.  HENT:     Head: Normocephalic.     Mouth/Throat:     Mouth: Mucous membranes are moist.  Eyes:     General: No scleral icterus.    Conjunctiva/sclera: Conjunctivae normal.  Neck:     Thyroid: No thyromegaly.  Cardiovascular:     Rate and Rhythm: Normal rate and regular rhythm.     Heart sounds: No murmur heard.    No friction rub. No gallop.  Pulmonary:     Breath sounds: No stridor. No wheezing or rales.  Chest:     Chest wall: No tenderness.  Abdominal:     General: There is distension.     Tenderness: There is no abdominal tenderness. There is no rebound.  Musculoskeletal:        General: Normal range of motion.     Cervical back: Neck supple.  Lymphadenopathy:     Cervical: No cervical adenopathy.  Skin:    Findings: No erythema or rash.  Neurological:     Mental Status: She is alert and oriented to person, place, and time.  Motor: No abnormal muscle tone.     Coordination: Coordination normal.  Psychiatric:        Behavior: Behavior normal.     (all labs ordered are listed, but only abnormal results are displayed) Labs Reviewed  CBC WITH DIFFERENTIAL/PLATELET - Abnormal; Notable for the following components:      Result Value   WBC 17.7 (*)    Platelets 107 (*)    Neutro Abs 15.8 (*)    Lymphs Abs 0.6 (*)    Monocytes Absolute 1.1 (*)    Abs Immature Granulocytes 0.14 (*)    All other components within normal limits  COMPREHENSIVE METABOLIC PANEL WITH GFR - Abnormal; Notable for the following components:   Potassium 3.2 (*)    Glucose, Bld 143 (*)    All other components within normal  limits  URINE CULTURE  HEMOGLOBIN A1C  HIV ANTIBODY (ROUTINE TESTING W REFLEX)  URINE DRUG SCREEN  MAGNESIUM  CBC WITH DIFFERENTIAL/PLATELET  BASIC METABOLIC PANEL WITH GFR    EKG: None  Radiology: CT ABDOMEN PELVIS W CONTRAST Result Date: 10/17/2024 CLINICAL DATA:  Nausea and vomiting. EXAM: CT ABDOMEN AND PELVIS WITH CONTRAST TECHNIQUE: Multidetector CT imaging of the abdomen and pelvis was performed using the standard protocol following bolus administration of intravenous contrast. RADIATION DOSE REDUCTION: This exam was performed according to the departmental dose-optimization program which includes automated exposure control, adjustment of the mA and/or kV according to patient size and/or use of iterative reconstruction technique. CONTRAST:  75mL OMNIPAQUE  IOHEXOL  300 MG/ML  SOLN COMPARISON:  June 02, 2008 FINDINGS: Lower chest: No acute abnormality. Hepatobiliary: No focal liver abnormality is seen. No gallstones, gallbladder wall thickening, or biliary dilatation. Pancreas: Unremarkable. No pancreatic ductal dilatation or surrounding inflammatory changes. Spleen: Normal in size without focal abnormality. Adrenals/Urinary Tract: Adrenal glands are unremarkable. Kidneys are normal in size, without focal lesions. A 7 mm nonobstructing renal calculus is seen within the right kidney, with a 5 mm obstructing renal calculus seen at the right UVJ. Mild to moderate severity right-sided hydronephrosis and hydroureter are present. Ill-defined patchy areas of parenchymal low attenuation are also noted within the mid and upper right kidney. Bladder is unremarkable. Stomach/Bowel: Stomach is within normal limits. Appendix appears normal. No evidence of bowel wall thickening, distention, or inflammatory changes. Vascular/Lymphatic: No significant vascular findings are present. No enlarged abdominal or pelvic lymph nodes. Reproductive: Uterus and bilateral adnexa are unremarkable. Other: No abdominal wall  hernia or abnormality. No abdominopelvic ascites. Musculoskeletal: No acute or significant osseous findings. IMPRESSION: 1. 5 mm obstructing renal calculus at the right UVJ. 2. 7 mm nonobstructing renal calculus within the right kidney. 3. Findings consistent with right-sided pyelonephritis. Correlation with urinalysis is recommended. Electronically Signed   By: Suzen Dials M.D.   On: 10/17/2024 16:06     Procedures   Medications Ordered in the ED  enoxaparin  (LOVENOX ) injection 30 mg (has no administration in time range)  lactated ringers  1,000 mL with potassium chloride  20 mEq infusion ( Intravenous New Bag/Given 10/17/24 1438)  acetaminophen  (TYLENOL ) tablet 1,000 mg (1,000 mg Oral Given 10/17/24 1341)    Or  acetaminophen  (TYLENOL ) suppository 650 mg ( Rectal See Alternative 10/17/24 1341)  ketorolac  (TORADOL ) 15 MG/ML injection 15 mg (has no administration in time range)  traZODone  (DESYREL ) tablet 25 mg (has no administration in time range)  bisacodyl  (DULCOLAX) EC tablet 5 mg (has no administration in time range)  ondansetron  (ZOFRAN ) tablet 4 mg (has no administration in time range)  Or  ondansetron  (ZOFRAN ) injection 4 mg (has no administration in time range)  prochlorperazine  (COMPAZINE ) injection 10 mg (has no administration in time range)  nicotine  (NICODERM CQ  - dosed in mg/24 hours) patch 14 mg (has no administration in time range)  cefTRIAXone  (ROCEPHIN ) 2 g in sodium chloride  0.9 % 100 mL IVPB (has no administration in time range)  feeding supplement (ENSURE PLUS HIGH PROTEIN) liquid 237 mL (237 mLs Oral Given 10/17/24 1618)  sodium chloride  0.9 % bolus 1,000 mL (0 mLs Intravenous Stopped 10/17/24 1117)  ketorolac  (TORADOL ) 30 MG/ML injection 30 mg (30 mg Intravenous Given 10/17/24 0926)  ondansetron  (ZOFRAN ) injection 4 mg (4 mg Intravenous Given 10/17/24 0927)  cefTRIAXone  (ROCEPHIN ) 2 g in sodium chloride  0.9 % 100 mL IVPB (0 g Intravenous Stopped 10/17/24 1204)  sodium chloride   0.9 % bolus 1,000 mL (1,000 mLs Intravenous Bolus 10/17/24 1246)  potassium chloride  10 mEq in 100 mL IVPB (10 mEq Intravenous New Bag/Given 10/17/24 1247)  iohexol  (OMNIPAQUE ) 300 MG/ML solution 75 mL (75 mLs Intravenous Contrast Given 10/17/24 1539)                                    Medical Decision Making Amount and/or Complexity of Data Reviewed Labs: ordered.  Risk Prescription drug management. Decision regarding hospitalization.   Patient with urinary tract infection and persistent vomiting.  She will be admitted to medicine     Final diagnoses:  Acute cystitis with hematuria    ED Discharge Orders     None          Suzette Pac, MD 10/17/24 1708  "

## 2024-10-17 NOTE — Plan of Care (Signed)

## 2024-10-18 ENCOUNTER — Observation Stay (HOSPITAL_COMMUNITY): Admitting: Certified Registered Nurse Anesthetist

## 2024-10-18 ENCOUNTER — Encounter (HOSPITAL_COMMUNITY)
Admission: EM | Disposition: A | Discharge status: Home / Self Care | Payer: Self-pay | Source: Home / Self Care | Attending: Internal Medicine

## 2024-10-18 ENCOUNTER — Observation Stay (HOSPITAL_COMMUNITY)

## 2024-10-18 ENCOUNTER — Encounter (HOSPITAL_COMMUNITY): Payer: Self-pay | Admitting: Family Medicine

## 2024-10-18 DIAGNOSIS — Z87442 Personal history of urinary calculi: Secondary | ICD-10-CM | POA: Diagnosis not present

## 2024-10-18 DIAGNOSIS — N201 Calculus of ureter: Secondary | ICD-10-CM | POA: Diagnosis not present

## 2024-10-18 DIAGNOSIS — D6959 Other secondary thrombocytopenia: Secondary | ICD-10-CM | POA: Diagnosis present

## 2024-10-18 DIAGNOSIS — R636 Underweight: Secondary | ICD-10-CM | POA: Diagnosis present

## 2024-10-18 DIAGNOSIS — Z8619 Personal history of other infectious and parasitic diseases: Secondary | ICD-10-CM | POA: Diagnosis not present

## 2024-10-18 DIAGNOSIS — Z79899 Other long term (current) drug therapy: Secondary | ICD-10-CM | POA: Diagnosis not present

## 2024-10-18 DIAGNOSIS — Z793 Long term (current) use of hormonal contraceptives: Secondary | ICD-10-CM | POA: Diagnosis not present

## 2024-10-18 DIAGNOSIS — J45909 Unspecified asthma, uncomplicated: Secondary | ICD-10-CM | POA: Diagnosis present

## 2024-10-18 DIAGNOSIS — E876 Hypokalemia: Secondary | ICD-10-CM | POA: Diagnosis present

## 2024-10-18 DIAGNOSIS — A4159 Other Gram-negative sepsis: Secondary | ICD-10-CM | POA: Diagnosis not present

## 2024-10-18 DIAGNOSIS — R739 Hyperglycemia, unspecified: Secondary | ICD-10-CM | POA: Diagnosis present

## 2024-10-18 DIAGNOSIS — Z681 Body mass index (BMI) 19 or less, adult: Secondary | ICD-10-CM | POA: Diagnosis not present

## 2024-10-18 DIAGNOSIS — F439 Reaction to severe stress, unspecified: Secondary | ICD-10-CM | POA: Diagnosis present

## 2024-10-18 DIAGNOSIS — N10 Acute tubulo-interstitial nephritis: Secondary | ICD-10-CM | POA: Diagnosis not present

## 2024-10-18 DIAGNOSIS — Z880 Allergy status to penicillin: Secondary | ICD-10-CM | POA: Diagnosis not present

## 2024-10-18 DIAGNOSIS — N202 Calculus of kidney with calculus of ureter: Secondary | ICD-10-CM | POA: Diagnosis present

## 2024-10-18 DIAGNOSIS — A419 Sepsis, unspecified organism: Secondary | ICD-10-CM

## 2024-10-18 DIAGNOSIS — R112 Nausea with vomiting, unspecified: Secondary | ICD-10-CM | POA: Diagnosis present

## 2024-10-18 DIAGNOSIS — Z825 Family history of asthma and other chronic lower respiratory diseases: Secondary | ICD-10-CM | POA: Diagnosis not present

## 2024-10-18 DIAGNOSIS — N136 Pyonephrosis: Secondary | ICD-10-CM | POA: Diagnosis present

## 2024-10-18 DIAGNOSIS — F17290 Nicotine dependence, other tobacco product, uncomplicated: Secondary | ICD-10-CM | POA: Diagnosis present

## 2024-10-18 HISTORY — PX: CYSTOSCOPY W/ URETERAL STENT PLACEMENT: SHX1429

## 2024-10-18 LAB — CBC WITH DIFFERENTIAL/PLATELET
Abs Immature Granulocytes: 0.09 K/uL — ABNORMAL HIGH (ref 0.00–0.07)
Basophils Absolute: 0 K/uL (ref 0.0–0.1)
Basophils Relative: 0 %
Eosinophils Absolute: 0 K/uL (ref 0.0–0.5)
Eosinophils Relative: 0 %
HCT: 32.1 % — ABNORMAL LOW (ref 36.0–46.0)
Hemoglobin: 10.2 g/dL — ABNORMAL LOW (ref 12.0–15.0)
Immature Granulocytes: 1 %
Lymphocytes Relative: 10 %
Lymphs Abs: 1.1 K/uL (ref 0.7–4.0)
MCH: 27.9 pg (ref 26.0–34.0)
MCHC: 31.8 g/dL (ref 30.0–36.0)
MCV: 87.9 fL (ref 80.0–100.0)
Monocytes Absolute: 1.2 K/uL — ABNORMAL HIGH (ref 0.1–1.0)
Monocytes Relative: 10 %
Neutro Abs: 9.6 K/uL — ABNORMAL HIGH (ref 1.7–7.7)
Neutrophils Relative %: 79 %
Platelets: 98 K/uL — ABNORMAL LOW (ref 150–400)
RBC: 3.65 MIL/uL — ABNORMAL LOW (ref 3.87–5.11)
RDW: 13.3 % (ref 11.5–15.5)
WBC: 12 K/uL — ABNORMAL HIGH (ref 4.0–10.5)
nRBC: 0 % (ref 0.0–0.2)

## 2024-10-18 LAB — BASIC METABOLIC PANEL WITH GFR
Anion gap: 7 (ref 5–15)
BUN: 6 mg/dL (ref 6–20)
CO2: 25 mmol/L (ref 22–32)
Calcium: 7.9 mg/dL — ABNORMAL LOW (ref 8.9–10.3)
Chloride: 106 mmol/L (ref 98–111)
Creatinine, Ser: 0.65 mg/dL (ref 0.44–1.00)
GFR, Estimated: >60 mL/min
Glucose, Bld: 90 mg/dL (ref 70–99)
Potassium: 3.7 mmol/L (ref 3.5–5.1)
Sodium: 138 mmol/L (ref 135–145)

## 2024-10-18 LAB — MAGNESIUM: Magnesium: 1.9 mg/dL (ref 1.7–2.4)

## 2024-10-18 MED ORDER — DEXAMETHASONE SOD PHOSPHATE PF 10 MG/ML IJ SOLN
INTRAMUSCULAR | Status: AC
  Filled 2024-10-18: qty 1

## 2024-10-18 MED ORDER — OXYCODONE HCL 5 MG PO TABS: 5.0000 mg | ORAL_TABLET | Freq: Once | ORAL | Status: DC | PRN

## 2024-10-18 MED ORDER — PROPOFOL 10 MG/ML IV BOLUS
INTRAVENOUS | Status: AC
  Filled 2024-10-18: qty 20

## 2024-10-18 MED ORDER — FENTANYL CITRATE (PF) 100 MCG/2ML IJ SOLN
INTRAMUSCULAR | Status: AC
  Filled 2024-10-18: qty 2

## 2024-10-18 MED ORDER — FENTANYL CITRATE (PF) 50 MCG/ML IJ SOSY: 25.0000 ug | PREFILLED_SYRINGE | INTRAMUSCULAR | Status: DC | PRN

## 2024-10-18 MED ORDER — PROPOFOL 10 MG/ML IV BOLUS
INTRAVENOUS | Status: DC | PRN
  Administered 2024-10-18: 150 mg via INTRAVENOUS

## 2024-10-18 MED ORDER — CHLORHEXIDINE GLUCONATE 0.12 % MT SOLN: 15.0000 mL | Freq: Once | OROMUCOSAL | Status: DC

## 2024-10-18 MED ORDER — ONDANSETRON HCL 4 MG/2ML IJ SOLN
INTRAMUSCULAR | Status: AC
  Filled 2024-10-18: qty 2

## 2024-10-18 MED ORDER — STERILE WATER FOR IRRIGATION IR SOLN
Status: DC | PRN
  Administered 2024-10-18: 500 mL

## 2024-10-18 MED ORDER — ONDANSETRON HCL 4 MG/2ML IJ SOLN: 4.0000 mg | Freq: Once | INTRAMUSCULAR | Status: DC | PRN

## 2024-10-18 MED ORDER — OXYCODONE HCL 5 MG/5ML PO SOLN: 5.0000 mg | Freq: Once | ORAL | Status: DC | PRN

## 2024-10-18 MED ORDER — ORAL CARE MOUTH RINSE: 15.0000 mL | Freq: Once | OROMUCOSAL | Status: DC

## 2024-10-18 MED ORDER — MIDAZOLAM HCL (PF) 2 MG/2ML IJ SOLN
INTRAMUSCULAR | Status: DC | PRN
  Administered 2024-10-18: 2 mg via INTRAVENOUS

## 2024-10-18 MED ORDER — LIDOCAINE 2% (20 MG/ML) 5 ML SYRINGE
INTRAMUSCULAR | Status: AC
  Filled 2024-10-18: qty 5

## 2024-10-18 MED ORDER — FENTANYL CITRATE (PF) 100 MCG/2ML IJ SOLN
INTRAMUSCULAR | Status: DC | PRN
  Administered 2024-10-18: 100 ug via INTRAVENOUS

## 2024-10-18 MED ORDER — CHLORHEXIDINE GLUCONATE 0.12 % MT SOLN
15.0000 mL | Freq: Once | OROMUCOSAL | Status: AC
  Administered 2024-10-18: 15 mL via OROMUCOSAL

## 2024-10-18 MED ORDER — LACTATED RINGERS IV SOLN: INTRAVENOUS | Status: DC

## 2024-10-18 MED ORDER — ENOXAPARIN SODIUM 40 MG/0.4ML IJ SOSY
40.0000 mg | PREFILLED_SYRINGE | INTRAMUSCULAR | Status: DC
  Filled 2024-10-18: qty 0.4

## 2024-10-18 MED ORDER — MIDAZOLAM HCL 2 MG/2ML IJ SOLN
INTRAMUSCULAR | Status: AC
  Filled 2024-10-18: qty 2

## 2024-10-18 MED ORDER — DEXAMETHASONE SOD PHOSPHATE PF 10 MG/ML IJ SOLN
INTRAMUSCULAR | Status: DC | PRN
  Administered 2024-10-18: 5 mg via INTRAVENOUS

## 2024-10-18 MED ORDER — SODIUM CHLORIDE 0.9 % IR SOLN
Status: DC | PRN
  Administered 2024-10-18: 3000 mL

## 2024-10-18 MED ORDER — LIDOCAINE 2% (20 MG/ML) 5 ML SYRINGE
INTRAMUSCULAR | Status: DC | PRN
  Administered 2024-10-18: 60 mg via INTRAVENOUS

## 2024-10-18 MED ORDER — DIATRIZOATE MEGLUMINE 30 % UR SOLN
URETHRAL | Status: DC | PRN
  Administered 2024-10-18: 8 mL via URETHRAL

## 2024-10-18 MED ORDER — DIATRIZOATE MEGLUMINE 30 % UR SOLN
URETHRAL | Status: AC
  Filled 2024-10-18: qty 100

## 2024-10-18 NOTE — Progress Notes (Addendum)
 "          PROGRESS NOTE  April Young FMW:[id number] DOB: 09-Feb-1996 DOA: 10/17/2024 PCP: Patient, No Pcp Per  Brief History:  29 year old female with no documented chronic medical problems recently seen in the [facility] on 10/15/2024 complaining of back pain and flank pain on the right side and urinary frequency and cloudiness was diagnosed with a UTI and treated with Macrobid .  She returned to the ED 10/17/24 complaining of ongoing nausea and vomiting and back pain/flank pain and overall malaise.  She denies fever and chills.  She was noted to be hypokalemic and blood glucose elevated at 143.  Her white blood cell count was elevated at 17.7.  Platelets were down to 107.  She is being admitted to the hospital for ongoing treatment of this UTI that has failed outpatient management.. CT abdomen and pelvis showed 5 mm right renal calculus in the right UVJ.  Urology was consulted.  The patient was started on ceftriaxone .   Assessment/Plan: Pyelonephritis/right ureteral stone - Urology, Dr. Sherrilee consulted - Keep n.p.o. - Continue ceftriaxone  - IV fluids continue - UA >50 WBC - Follow urine culture  Thrombocytopenia - Secondary to acute medical condition - B12 - Folic acid - No splenomegaly on CT [facility]  Hyperglycemia - Stress-induced - 10/17/2024 hemoglobin A1c 5.0  Hypokalemia - Repleted - Magnesium 1.9        Family Communication: no  Family at bedside  Consultants:  urology  Code Status:  FULL   DVT Prophylaxis:  [place] Lovenox    Procedures: As Listed in Progress Note Above  Antibiotics: Ceftriaxone  1/6>>    Subjective: Patient states that right flank pain is better than yesterday.  She denies any vomiting but has some nausea.  She denies any chest pain, shortness breath, fever, chills, headache, neck pain.  Objective: Vitals:   10/17/24 1815 10/17/24 2201 10/17/24 2255 10/18/24 0206  BP: 129/79 106/60  92/81  Pulse: 92 88  76  Resp: 18 20  19   Temp: 99.4 F  (37.4 C) (!) 101.6 F (38.7 C) 98.7 F (37.1 C) 98.9 F (37.2 C)  TempSrc: Oral Oral Oral Oral  SpO2: 100% 100%  100%  Weight:      Height:        Intake/Output Summary (Last 24 hours) at 10/18/2024 0824 Last data filed at 10/18/2024 0436 Gross per 24 hour  Intake 1407.48 ml  Output --  Net 1407.48 ml   Weight change:  Exam:  General:  Pt is alert, follows commands appropriately, not in acute distress HEENT: No icterus, No thrush, No neck mass, [place]/AT Cardiovascular: RRR, S1/S2, no rubs, no gallops Respiratory: CTA bilaterally, no wheezing, no crackles, no rhonchi Abdomen: Soft/+BS, non tender, non distended, no guarding Extremities: No edema, No lymphangitis, No petechiae, No rashes, no synovitis   Data Reviewed: I have personally reviewed following labs and imaging studies Basic Metabolic Panel: Recent Labs  Lab 10/17/24 0926 10/18/24 0410  NA 137 138  K 3.2* 3.7  CL 100 106  CO2 25 25  GLUCOSE 143* 90  BUN 11 6  CREATININE 0.87 0.65  CALCIUM  8.9 7.9*  MG  --  1.9   Liver Function Tests: Recent Labs  Lab 10/17/24 0926  AST 19  ALT 7  ALKPHOS 103  BILITOT 0.5  PROT 7.7  ALBUMIN 4.4   No results for input(s): LIPASE, AMYLASE in the last 168 hours. No results for input(s): AMMONIA in the last 168 hours. Coagulation Profile: No  results for input(s): INR, PROTIME in the last 168 hours. CBC: Recent Labs  Lab 10/17/24 0926 10/18/24 0410  WBC 17.7* 12.0*  NEUTROABS 15.8* 9.6*  HGB 12.3 10.2*  HCT 38.3 32.1*  MCV 88.5 87.9  PLT 107* 98*   Cardiac Enzymes: No results for input(s): CKTOTAL, CKMB, CKMBINDEX, TROPONINI in the last 168 hours. BNP: Invalid input(s): POCBNP CBG: No results for input(s): GLUCAP in the last 168 hours. HbA1C: Recent Labs    10/17/24 0926  HGBA1C 5.0   Urine analysis:    Component Value Date/Time   COLORURINE AMBER (A) 10/15/2024 1016   APPEARANCEUR CLOUDY (A) 10/15/2024 1016   LABSPEC 1.018  10/15/2024 1016   PHURINE 7.0 10/15/2024 1016   GLUCOSEU NEGATIVE 10/15/2024 1016   HGBUR NEGATIVE 10/15/2024 1016   BILIRUBINUR NEGATIVE 10/15/2024 1016   KETONESUR NEGATIVE 10/15/2024 1016   PROTEINUR >=300 (A) 10/15/2024 1016   UROBILINOGEN 1.0 02/06/2015 0948   NITRITE POSITIVE (A) 10/15/2024 1016   LEUKOCYTESUR LARGE (A) 10/15/2024 1016   Sepsis Labs: @LABRCNTIP (procalcitonin:4,lacticidven:4) )No results found for this or any previous visit (from the past 240 hours).   Scheduled Meds:  acetaminophen   1,000 mg Oral Q6H   Or   acetaminophen   650 mg Rectal Q6H   enoxaparin  (LOVENOX ) injection  30 mg Subcutaneous Q24H   feeding supplement  237 mL Oral BID BM   Continuous Infusions:  cefTRIAXone  (ROCEPHIN )  IV     lactated ringers  1,000 mL with potassium chloride  20 mEq infusion 125 mL/hr at 10/18/24 0102    Procedures/Studies: CT ABDOMEN PELVIS W CONTRAST Result Date: 10/17/2024 CLINICAL DATA:  Nausea and vomiting. EXAM: CT ABDOMEN AND PELVIS WITH CONTRAST TECHNIQUE: Multidetector CT imaging of the abdomen and pelvis was performed using the standard protocol following bolus administration of intravenous contrast. RADIATION DOSE REDUCTION: This exam was performed according to the departmental dose-optimization program which includes automated exposure control, adjustment of the mA and/or kV according to patient size and/or use of iterative reconstruction technique. CONTRAST:  75mL OMNIPAQUE  IOHEXOL  300 MG/ML  SOLN COMPARISON:  June 02, 2008 FINDINGS: Lower chest: No acute abnormality. Hepatobiliary: No focal liver abnormality is seen. No gallstones, gallbladder wall thickening, or biliary dilatation. Pancreas: Unremarkable. No pancreatic ductal dilatation or surrounding inflammatory changes. Spleen: Normal in size without focal abnormality. Adrenals/Urinary Tract: Adrenal glands are unremarkable. Kidneys are normal in size, without focal lesions. A 7 mm nonobstructing renal calculus is  seen within the right kidney, with a 5 mm obstructing renal calculus seen at the right UVJ. Mild to moderate severity right-sided hydronephrosis and hydroureter are present. Ill-defined patchy areas of parenchymal low attenuation are also noted within the mid and upper right kidney. Bladder is unremarkable. Stomach/Bowel: Stomach is within normal limits. Appendix appears normal. No evidence of bowel wall thickening, distention, or inflammatory changes. Vascular/Lymphatic: No significant vascular findings are present. No enlarged abdominal or pelvic lymph nodes. Reproductive: Uterus and bilateral adnexa are unremarkable. Other: No abdominal wall hernia or abnormality. No abdominopelvic ascites. Musculoskeletal: No acute or significant osseous findings. IMPRESSION: 1. 5 mm obstructing renal calculus at the right UVJ. 2. 7 mm nonobstructing renal calculus within the right kidney. 3. Findings consistent with right-sided pyelonephritis. Correlation with urinalysis is recommended. Electronically Signed   By: Suzen Dials M.D.   On: 10/17/2024 16:06    Alm Schneider, DO  [facility]  If 7PM-7AM, please contact night-coverage [email or web address] Password [email or web address] 10/18/2024, 8:24 AM   LOS: 0 days   "

## 2024-10-18 NOTE — Anesthesia Procedure Notes (Signed)
 Procedure Name: LMA Insertion Date/Time: 10/18/2024 12:33 PM  Performed by: Cordella Elvie HERO, CRNAPre-anesthesia Checklist: Patient identified, Emergency Drugs available, Suction available, Patient being monitored and Timeout performed Patient Re-evaluated:Patient Re-evaluated prior to induction Oxygen Delivery Method: Circle system utilized Preoxygenation: Pre-oxygenation with 100% oxygen Induction Type: IV induction LMA: LMA inserted LMA Size: 4.0 Number of attempts: 1 Placement Confirmation: positive ETCO2, CO2 detector and breath sounds checked- equal and bilateral Tube secured with: Tape Dental Injury: Teeth and Oropharynx as per pre-operative assessment

## 2024-10-18 NOTE — Consult Note (Signed)
 Urology Consult  Referring physician: Dr. Evonnie Reason for referral: right ureteral stone, fever  Chief Complaint: right flank pain  History of Present Illness: April Young is a 28yo who presented to the ER with a 2 day history of worsening right flank pain and fevers. She was seen on Sunday at [facility] ER and was diagnosed with a UTi and started therapy. She then developed nausea/vomiting and right flank pain causing her to present to the ER yesterday. She underwent CT which showed a right 5-80mm UVJ stone and a 6mm right lower pole calculus. Her pain is sharp, intermittent, moderate to sever and nonradiating. She is have dysuira, urinary urgency and frequency. She has a fever of 101.6 this morning.  WBC count 17.7  Past Medical History:  Diagnosis Date   Abdominal pain affecting pregnancy 03/10/2017   Abnormal antibody titer 07/28/2022   07/28/22 Anti C anti D titer was 4    Asthma    Candida vaginitis 03/10/2017   Hx of trichomoniasis 11/2016   Rh negative status during pregnancy    Sepsis due to Escherichia coli (HCC) 03/2017   Urinary tract infection 03/10/2017   Past Surgical History:  Procedure Laterality Date   SKIN SURGERY     abcess    Medications: I have reviewed the patient's current medications. Allergies: Allergies[1]  Family History  Problem Relation Age of Onset   COPD Maternal Grandmother    Asthma Daughter    Social History:  reports that she has been smoking e-cigarettes. She has never used smokeless tobacco. She reports current drug use. Drug: Marijuana. She reports that she does not drink alcohol.  Review of Systems  Gastrointestinal:  Positive for nausea and vomiting.  Genitourinary:  Positive for dysuria, flank pain, frequency and urgency.  All other systems reviewed and are negative.   Physical Exam:  Vital signs in last 24 hours: Temp:  [98.4 F (36.9 C)-101.6 F (38.7 C)] 98.4 F (36.9 C) (01/07 1132) Pulse Rate:  [74-95] 74 (01/07 1132) Resp:   [15-20] 16 (01/07 1132) BP: (89-129)/(55-81) 109/71 (01/07 1132) SpO2:  [99 %-100 %] 100 % (01/07 1132) Weight:  [49.9 kg] 49.9 kg (01/07 1132) Physical Exam Nursing note reviewed.  Constitutional:      Appearance: Normal appearance.  HENT:     Head: Normocephalic and atraumatic.     Nose: Nose normal.     Mouth/Throat:     Mouth: Mucous membranes are dry.  Eyes:     Extraocular Movements: Extraocular movements intact.     Pupils: Pupils are equal, round, and reactive to light.  Cardiovascular:     Rate and Rhythm: Normal rate and regular rhythm.  Pulmonary:     Effort: Pulmonary effort is normal. No respiratory distress.  Abdominal:     General: Abdomen is flat. There is no distension.  Musculoskeletal:        General: No swelling. Normal range of motion.     Cervical back: Normal range of motion and neck supple.  Skin:    General: Skin is warm and dry.  Neurological:     General: No focal deficit present.     Mental Status: She is alert and oriented to person, place, and time.  Psychiatric:        Mood and Affect: Mood normal.        Behavior: Behavior normal.        Thought Content: Thought content normal.        Judgment: Judgment normal.  Laboratory Data:  Results for orders placed or performed during the hospital encounter of 10/17/24 (from the past 72 hours)  Urine Culture     Status: None (Preliminary result)   Collection Time: 10/17/24  9:10 AM   Specimen: Urine, Clean Catch  Result Value Ref Range   Specimen Description      URINE, CLEAN CATCH Performed at [facility], [address] [place], [place] [address]    Special Requests      NONE Performed at [facility], [address] [place], [place] [address]    Culture      CULTURE REINCUBATED FOR BETTER GROWTH Performed at [facility], [address], [place], [place] [address]    Report Status PENDING   CBC with Differential     Status: Abnormal   Collection Time: 10/17/24  9:26 AM   Result Value Ref Range   WBC 17.7 (H) 4.0 - 10.5 K/uL   RBC 4.33 3.87 - 5.11 MIL/uL   Hemoglobin 12.3 12.0 - 15.0 g/dL   HCT 61.6 63.9 - 53.9 %   MCV 88.5 80.0 - 100.0 fL   MCH 28.4 26.0 - 34.0 pg   MCHC 32.1 30.0 - 36.0 g/dL   RDW 86.6 88.4 - 84.4 %   Platelets 107 (L) 150 - 400 K/uL   nRBC 0.0 0.0 - 0.2 %   Neutrophils Relative % 89 %   Neutro Abs 15.8 (H) 1.7 - 7.7 K/uL   Lymphocytes Relative 4 %   Lymphs Abs 0.6 (L) 0.7 - 4.0 K/uL   Monocytes Relative 6 %   Monocytes Absolute 1.1 (H) 0.1 - 1.0 K/uL   Eosinophils Relative 0 %   Eosinophils Absolute 0.0 0.0 - 0.5 K/uL   Basophils Relative 0 %   Basophils Absolute 0.0 0.0 - 0.1 K/uL   Immature Granulocytes 1 %   Abs Immature Granulocytes 0.14 (H) 0.00 - 0.07 K/uL    Comment: Performed at [facility], [address] [place], [place] [address]  Comprehensive metabolic panel     Status: Abnormal   Collection Time: 10/17/24  9:26 AM  Result Value Ref Range   Sodium 137 135 - 145 mmol/L   Potassium 3.2 (L) 3.5 - 5.1 mmol/L   Chloride 100 98 - 111 mmol/L   CO2 25 22 - 32 mmol/L   Glucose, Bld 143 (H) 70 - 99 mg/dL    Comment: Glucose reference range applies only to samples taken after fasting for at least 8 hours.   BUN 11 6 - 20 mg/dL   Creatinine, Ser 9.12 0.44 - 1.00 mg/dL   Calcium  8.9 8.9 - 10.3 mg/dL   Total Protein 7.7 6.5 - 8.1 g/dL   Albumin 4.4 3.5 - 5.0 g/dL   AST 19 15 - 41 U/L   ALT 7 0 - 44 U/L   Alkaline Phosphatase 103 38 - 126 U/L   Total Bilirubin 0.5 0.0 - 1.2 mg/dL   GFR, Estimated >39 >39 mL/min    Comment: (NOTE) Calculated using the CKD-EPI Creatinine Equation (2021)    Anion gap 12 5 - 15    Comment: Performed at [facility], [address], [place], [place] [address]  Hemoglobin A1c     Status: None   Collection Time: 10/17/24  9:26 AM  Result Value Ref Range   Hgb A1c MFr Bld 5.0 4.8 - 5.6 %    Comment: (NOTE) Diagnosis of Diabetes The following HbA1c ranges recommended by the  American  Diabetes Association (ADA) may be used as an aid in the diagnosis of diabetes mellitus.  Hemoglobin             Suggested A1C NGSP%              Diagnosis  <5.7                   Non Diabetic  5.7-6.4                Pre-Diabetic  >6.4                   Diabetic  <7.0                   Glycemic control for                       adults with diabetes.     Mean Plasma Glucose 96.8 mg/dL    Comment: Performed at [facility], [address], [place], [place] [address]  HIV Antibody (routine testing w rflx)     Status: None   Collection Time: 10/17/24  1:16 PM  Result Value Ref Range   HIV Screen 4th Generation wRfx Non Reactive Non Reactive    Comment: Performed at [facility], [address] [place], [place] [address]  Urine Drug Screen     Status: Abnormal   Collection Time: 10/17/24  5:22 PM  Result Value Ref Range   Opiates NEGATIVE NEGATIVE   Cocaine NEGATIVE NEGATIVE   Benzodiazepines NEGATIVE NEGATIVE   Amphetamines NEGATIVE NEGATIVE   Tetrahydrocannabinol POSITIVE (A) NEGATIVE   Barbiturates NEGATIVE NEGATIVE   Methadone Scn, Ur NEGATIVE NEGATIVE   Fentanyl  NEGATIVE NEGATIVE    Comment: (NOTE) Drug screen is for Medical Purposes only. Positive results are preliminary only. If confirmation is needed, notify lab within 5 days.  Drug Class                 Cutoff (ng/mL) Amphetamine and metabolites 1000 Barbiturate and metabolites 200 Benzodiazepine              200 Opiates and metabolites     300 Cocaine and metabolites     300 THC                         50 Fentanyl                     5 Methadone                   300  Trazodone  is metabolized in vivo to several metabolites,  including pharmacologically active m-CPP, which is excreted in the  urine.  Immunoassay screens for amphetamines and MDMA have potential  cross-reactivity with these compounds and may provide false positive  result.  Performed at [facility], [address],  [place], [place] [address]   Magnesium     Status: None   Collection Time: 10/18/24  4:10 AM  Result Value Ref Range   Magnesium 1.9 1.7 - 2.4 mg/dL    Comment: Performed at [facility], [address], [place], [place] [address]  CBC with Differential/Platelet     Status: Abnormal   Collection Time: 10/18/24  4:10 AM  Result Value Ref Range   WBC 12.0 (H) 4.0 - 10.5 K/uL   RBC 3.65 (L) 3.87 - 5.11 MIL/uL   Hemoglobin 10.2 (L)  12.0 - 15.0 g/dL   HCT 67.8 (L) 63.9 - 53.9 %   MCV 87.9 80.0 - 100.0 fL   MCH 27.9 26.0 - 34.0 pg   MCHC 31.8 30.0 - 36.0 g/dL   RDW 86.6 88.4 - 84.4 %   Platelets 98 (L) 150 - 400 K/uL    Comment: REPEATED TO VERIFY PLATELET COUNT CONFIRMED BY SMEAR Immature Platelet Fraction may be clinically indicated, consider ordering this additional test [id number]    nRBC 0.0 0.0 - 0.2 %   Neutrophils Relative % 79 %   Neutro Abs 9.6 (H) 1.7 - 7.7 K/uL   Lymphocytes Relative 10 %   Lymphs Abs 1.1 0.7 - 4.0 K/uL   Monocytes Relative 10 %   Monocytes Absolute 1.2 (H) 0.1 - 1.0 K/uL   Eosinophils Relative 0 %   Eosinophils Absolute 0.0 0.0 - 0.5 K/uL   Basophils Relative 0 %   Basophils Absolute 0.0 0.0 - 0.1 K/uL   WBC Morphology MORPHOLOGY UNREMARKABLE    RBC Morphology MORPHOLOGY UNREMARKABLE    Immature Granulocytes 1 %   Abs Immature Granulocytes 0.09 (H) 0.00 - 0.07 K/uL    Comment: Performed at [facility], [address] [place], [place] [address]  Basic metabolic panel     Status: Abnormal   Collection Time: 10/18/24  4:10 AM  Result Value Ref Range   Sodium 138 135 - 145 mmol/L   Potassium 3.7 3.5 - 5.1 mmol/L   Chloride 106 98 - 111 mmol/L   CO2 25 22 - 32 mmol/L   Glucose, Bld 90 70 - 99 mg/dL    Comment: Glucose reference range applies only to samples taken after fasting for at least 8 hours.   BUN 6 6 - 20 mg/dL   Creatinine, Ser 9.34 0.44 - 1.00 mg/dL   Calcium  7.9 (L) 8.9 - 10.3 mg/dL   GFR, Estimated >39 >39 mL/min    Comment:  (NOTE) Calculated using the CKD-EPI Creatinine Equation (2021)    Anion gap 7 5 - 15    Comment: Performed at [facility], [address], [place], [place] [address]   Recent Results (from the past 240 hours)  Urine Culture     Status: None (Preliminary result)   Collection Time: 10/17/24  9:10 AM   Specimen: Urine, Clean Catch  Result Value Ref Range Status   Specimen Description   Final    URINE, CLEAN CATCH Performed at [facility], [address] [place], [place] [address]    Special Requests   Final    NONE Performed at [facility], [address] [place], [place] [address]    Culture   Final    CULTURE REINCUBATED FOR BETTER GROWTH Performed at [facility], [address] [place], [place] [address]    Report Status PENDING  Incomplete   Creatinine: Recent Labs    10/17/24 0926 10/18/24 0410  CREATININE 0.87 0.65   Baseline Creatinine: 0.65  Impression/Assessment:  28yo with right ureteral calculus, sepsis  Plan:  -We discussed the management of kidney stones. These options include observation, ureteroscopy, shockwave lithotripsy (ESWL) and percutaneous nephrolithotomy (PCNL). We discussed which options are relevant to the patient's stone(s). We discussed the natural history of kidney stones as well as the complications of untreated stones and the impact on quality of life without treatment as well as with each of the above listed treatments. We also discussed the efficacy of each treatment in its ability to clear the stone  burden. With any of these management options I discussed the signs and symptoms of infection and the need for emergent treatment should these be experienced. For each option we discussed the ability of each procedure to clear the patient of their stone burden.   For observation I described the risks which include but are not limited to silent renal damage, life-threatening infection, need for emergent surgery, failure to pass stone and pain.    For ureteroscopy I described the risks which include bleeding, infection, damage to contiguous structures, positioning injury, ureteral stricture, ureteral avulsion, ureteral injury, need for prolonged ureteral stent, inability to perform ureteroscopy, need for an interval procedure, inability to clear stone burden, stent discomfort/pain, heart attack, stroke, pulmonary embolus and the inherent risks with general anesthesia.   For shockwave lithotripsy I described the risks which include arrhythmia, kidney contusion, kidney hemorrhage, need for transfusion, pain, inability to adequately break up stone, inability to pass stone fragments, Steinstrasse, infection associated with obstructing stones, need for alternate surgical procedure, need for repeat shockwave lithotripsy, MI, CVA, PE and the inherent risks with anesthesia/conscious sedation.   For PCNL I described the risks including positioning injury, pneumothorax, hydrothorax, need for chest tube, inability to clear stone burden, renal laceration, arterial venous fistula or malformation, need for embolization of kidney, loss of kidney or renal function, need for repeat procedure, need for prolonged nephrostomy tube, ureteral avulsion, MI, CVA, PE and the inherent risks of general anesthesia.   - The patient would like to proceed with right ureteral stent placement  April Young 10/18/2024, 12:07 PM          [1]  Allergies Allergen Reactions   Penicillins Rash and Other (See Comments)    Has patient had a PCN reaction causing immediate rash, facial/tongue/throat swelling, SOB or lightheadedness with hypotension: No Has patient had a PCN reaction causing severe rash involving mucus membranes or skin necrosis: No Has patient had a PCN reaction that required hospitalization: No Has patient had a PCN reaction occurring within the last 10 years: No If all of the above answers are NO, then may proceed with Cephalosporin use.

## 2024-10-18 NOTE — Discharge Instructions (Addendum)
 Providers Accepting New Patients in [place], [place]    [facility] [address]  [place], [place] [address] [phone] Accepts most insurances  [facility] [address] [place], [place] [address] [phone] Accepts most insurances  [facility] [address] [place], [place] [address]  [phone] Must meet requirements  [facility] [address] [place], [place] [address] [phone] Accepts most insurances  [facility] [address]  [place], [place] [address] [phone] Accepts most insurances  [facility] [address]   [place], [place]   [phone] Accepts most insurances  [facility])  [phone] [address]  [place], [place] [address] [phone] Accepts most insurances     [facility] [address]  [place], [place] [address] [phone] Accepts most insurances  [facility] [address] [place], [place] [address] [phone] option 1 Accepts Medicaid and [facility] [address]  [place], [place] [address] [phone] Accepts most insurances  Benita Outhouse, MD [address] [place], [place] [address] [phone] Accepts most insurances  [facility] at [facility] [address]  [place], [place] [address] [phone] Accepts most insurances  [facility] [phone] [address] [place], [place] [address] [phone] Accepts most insurances  [place] [place] [address], [address] [place], [place] [address] [phone]  Accepts most insurances   Suggestions For Increasing Calories And Protein Several small meals a day are easier to eat and digest than three large ones. Space meals about 2 to 3 hours apart to maximize comfort. Stop eating 2 to 3 hours before bed and sleep with your head elevated if gastric  reflux (GERD) and heartburn are problems. Do not eat your favorite foods if you are feeling bad. Save them for when you feel good! Eat breakfast-type foods at any meal. Eggs are usually easy to eat and are great any time of the day. (The same goes for pancakes and waffles.) Eat when you feel hungry. Most people have the greatest appetite in the morning because they have not eaten all night. If this is the best meal for you, then pile on those calories and other nutrients in the morning and at lunch. Then you can have a smaller dinner without losing total calories for the day. Eat leftovers or nutritious snacks in the afternoon and early evening to round out your day. Try homemade or commercially prepared nutrition bars and puddings, as well as calorie- and protein-rich liquid nutritional supplements. Benefits of Physical Activity Talk to your doctor about physical activity. Light or moderate physical activity can help maintain muscle and promote an appetite. Walking in the neighborhood or the local mall is a great way to get up, get out, and get moving. If you are unsteady on your feet, try walking around the dining room table. Save Room for [employer]! Drink most fluids between meals instead of with meals. (It is fine to have a sip to help swallow food at meal time.) Fluids (which usually have fewer calories and nutrients than solid food) can take up valuable space in your stomach.  Foods Recommended High-Protein Foods Milk products Add cheese to toast, crackers, sandwiches, baked potatoes, vegetables, soups, noodles, meat, and fruit. Use reduced-fat (2%) or whole milk in place of water  when cooking cereal and cream soups. Include cream sauces on vegetables and pasta. Add powdered  milk to cream soups and mashed potatoes.  Eggs Have hard-cooked eggs readily available in the refrigerator. Chop and add to salads, casseroles, soups, and vegetables. Make a quick egg salad. All eggs should be well  cooked to avoid the risk of harmful bacteria.  Meats, poultry, and fish Add leftover cooked meats to soups, casseroles, salads, and omelets. Make dip by mixing diced, chopped, or shredded meat with sour cream and spices.  Beans, legumes, nuts, and seeds Sprinkle nuts and seeds on cereals, fruit, and desserts such as ice cream, pudding, and custard. Also serve nuts and seeds on vegetables, salads, and pasta. Spread peanut butter on toast, bread, English muffins, and fruit, or blend it in a milk shake. Add beans and peas to salads, soups, casseroles, and vegetable dishes.  High-Calorie Foods Butter, margarine, and  oils Melt butter or margarine over potatoes, rice, pasta, and cooked vegetables. Add melted butter or margarine into soups and casseroles and spread on bread for sandwiches before spreading sandwich spread or peanut butter. Saut or stir-fry vegetables, meats, chicken and fish such as shrimp/scallops in olive or canola oil. A variety of oils add calories and can be used to transmontaigne, chicken, or fish.  Milk products Add whipping cream to desserts, pancakes, waffles, fruit, and hot chocolate, and fold it into soups and casseroles. Add sour cream to baked potatoes and vegetables.  Salad dressing Use regular (not low-fat or diet) mayonnaise and salad dressing on sandwiches and in dips with vegetables and fruit.   Sweets Add jelly and honey to bread and crackers. Add jam to fruit and ice cream and as a topping over cake.   Copyright 2020  Academy of Nutrition and Dietetics. All rights reserved.

## 2024-10-18 NOTE — Plan of Care (Signed)
   Problem: Activity: Goal: Risk for activity intolerance will decrease Outcome: Progressing   Problem: Coping: Goal: Level of anxiety will decrease Outcome: Progressing

## 2024-10-18 NOTE — Progress Notes (Signed)
 Transition of Care Department [facility]) has reviewed patient and no other TOC needs have been identified at this time. We will continue to monitor patient advancement through interdisciplinary progression rounds. If new patient transition needs arise, please place a TOC consult.   10/18/24 0902  TOC Brief Assessment  Insurance and Status Reviewed  Patient has primary care physician No (PCP list added to AVS)  Home environment has been reviewed Lives with family.  Prior level of function: Independent.  Prior/Current Home Services No current home services  Social Drivers of Health Review SDOH reviewed no interventions necessary  Readmission risk has been reviewed No  Transition of care needs no transition of care needs at this time

## 2024-10-18 NOTE — H&P (View-Only) (Signed)
 Urology Consult  Referring physician: Dr. Evonnie Reason for referral: right ureteral stone, fever  Chief Complaint: right flank pain  History of Present Illness: April Young is a 28yo who presented to the ER with a 2 day history of worsening right flank pain and fevers. She was seen on Sunday at [facility] ER and was diagnosed with a UTi and started therapy. She then developed nausea/vomiting and right flank pain causing her to present to the ER yesterday. She underwent CT which showed a right 5-80mm UVJ stone and a 6mm right lower pole calculus. Her pain is sharp, intermittent, moderate to sever and nonradiating. She is have dysuira, urinary urgency and frequency. She has a fever of 101.6 this morning.  WBC count 17.7  Past Medical History:  Diagnosis Date   Abdominal pain affecting pregnancy 03/10/2017   Abnormal antibody titer 07/28/2022   07/28/22 Anti C anti D titer was 4    Asthma    Candida vaginitis 03/10/2017   Hx of trichomoniasis 11/2016   Rh negative status during pregnancy    Sepsis due to Escherichia coli (HCC) 03/2017   Urinary tract infection 03/10/2017   Past Surgical History:  Procedure Laterality Date   SKIN SURGERY     abcess    Medications: I have reviewed the patient's current medications. Allergies: Allergies[1]  Family History  Problem Relation Age of Onset   COPD Maternal Grandmother    Asthma Daughter    Social History:  reports that she has been smoking e-cigarettes. She has never used smokeless tobacco. She reports current drug use. Drug: Marijuana. She reports that she does not drink alcohol.  Review of Systems  Gastrointestinal:  Positive for nausea and vomiting.  Genitourinary:  Positive for dysuria, flank pain, frequency and urgency.  All other systems reviewed and are negative.   Physical Exam:  Vital signs in last 24 hours: Temp:  [98.4 F (36.9 C)-101.6 F (38.7 C)] 98.4 F (36.9 C) (01/07 1132) Pulse Rate:  [74-95] 74 (01/07 1132) Resp:   [15-20] 16 (01/07 1132) BP: (89-129)/(55-81) 109/71 (01/07 1132) SpO2:  [99 %-100 %] 100 % (01/07 1132) Weight:  [49.9 kg] 49.9 kg (01/07 1132) Physical Exam Nursing note reviewed.  Constitutional:      Appearance: Normal appearance.  HENT:     Head: Normocephalic and atraumatic.     Nose: Nose normal.     Mouth/Throat:     Mouth: Mucous membranes are dry.  Eyes:     Extraocular Movements: Extraocular movements intact.     Pupils: Pupils are equal, round, and reactive to light.  Cardiovascular:     Rate and Rhythm: Normal rate and regular rhythm.  Pulmonary:     Effort: Pulmonary effort is normal. No respiratory distress.  Abdominal:     General: Abdomen is flat. There is no distension.  Musculoskeletal:        General: No swelling. Normal range of motion.     Cervical back: Normal range of motion and neck supple.  Skin:    General: Skin is warm and dry.  Neurological:     General: No focal deficit present.     Mental Status: She is alert and oriented to person, place, and time.  Psychiatric:        Mood and Affect: Mood normal.        Behavior: Behavior normal.        Thought Content: Thought content normal.        Judgment: Judgment normal.  Laboratory Data:  Results for orders placed or performed during the hospital encounter of 10/17/24 (from the past 72 hours)  Urine Culture     Status: None (Preliminary result)   Collection Time: 10/17/24  9:10 AM   Specimen: Urine, Clean Catch  Result Value Ref Range   Specimen Description      URINE, CLEAN CATCH Performed at [facility], [address] [place], [place] [address]    Special Requests      NONE Performed at [facility], [address] [place], [place] [address]    Culture      CULTURE REINCUBATED FOR BETTER GROWTH Performed at [facility], [address], [place], [place] [address]    Report Status PENDING   CBC with Differential     Status: Abnormal   Collection Time: 10/17/24  9:26 AM   Result Value Ref Range   WBC 17.7 (H) 4.0 - 10.5 K/uL   RBC 4.33 3.87 - 5.11 MIL/uL   Hemoglobin 12.3 12.0 - 15.0 g/dL   HCT 61.6 63.9 - 53.9 %   MCV 88.5 80.0 - 100.0 fL   MCH 28.4 26.0 - 34.0 pg   MCHC 32.1 30.0 - 36.0 g/dL   RDW 86.6 88.4 - 84.4 %   Platelets 107 (L) 150 - 400 K/uL   nRBC 0.0 0.0 - 0.2 %   Neutrophils Relative % 89 %   Neutro Abs 15.8 (H) 1.7 - 7.7 K/uL   Lymphocytes Relative 4 %   Lymphs Abs 0.6 (L) 0.7 - 4.0 K/uL   Monocytes Relative 6 %   Monocytes Absolute 1.1 (H) 0.1 - 1.0 K/uL   Eosinophils Relative 0 %   Eosinophils Absolute 0.0 0.0 - 0.5 K/uL   Basophils Relative 0 %   Basophils Absolute 0.0 0.0 - 0.1 K/uL   Immature Granulocytes 1 %   Abs Immature Granulocytes 0.14 (H) 0.00 - 0.07 K/uL    Comment: Performed at [facility], [address] [place], [place] [address]  Comprehensive metabolic panel     Status: Abnormal   Collection Time: 10/17/24  9:26 AM  Result Value Ref Range   Sodium 137 135 - 145 mmol/L   Potassium 3.2 (L) 3.5 - 5.1 mmol/L   Chloride 100 98 - 111 mmol/L   CO2 25 22 - 32 mmol/L   Glucose, Bld 143 (H) 70 - 99 mg/dL    Comment: Glucose reference range applies only to samples taken after fasting for at least 8 hours.   BUN 11 6 - 20 mg/dL   Creatinine, Ser 9.12 0.44 - 1.00 mg/dL   Calcium  8.9 8.9 - 10.3 mg/dL   Total Protein 7.7 6.5 - 8.1 g/dL   Albumin 4.4 3.5 - 5.0 g/dL   AST 19 15 - 41 U/L   ALT 7 0 - 44 U/L   Alkaline Phosphatase 103 38 - 126 U/L   Total Bilirubin 0.5 0.0 - 1.2 mg/dL   GFR, Estimated >39 >39 mL/min    Comment: (NOTE) Calculated using the CKD-EPI Creatinine Equation (2021)    Anion gap 12 5 - 15    Comment: Performed at [facility], [address], [place], [place] [address]  Hemoglobin A1c     Status: None   Collection Time: 10/17/24  9:26 AM  Result Value Ref Range   Hgb A1c MFr Bld 5.0 4.8 - 5.6 %    Comment: (NOTE) Diagnosis of Diabetes The following HbA1c ranges recommended by the  American  Diabetes Association (ADA) may be used as an aid in the diagnosis of diabetes mellitus.  Hemoglobin             Suggested A1C NGSP%              Diagnosis  <5.7                   Non Diabetic  5.7-6.4                Pre-Diabetic  >6.4                   Diabetic  <7.0                   Glycemic control for                       adults with diabetes.     Mean Plasma Glucose 96.8 mg/dL    Comment: Performed at [facility], [address], [place], [place] [address]  HIV Antibody (routine testing w rflx)     Status: None   Collection Time: 10/17/24  1:16 PM  Result Value Ref Range   HIV Screen 4th Generation wRfx Non Reactive Non Reactive    Comment: Performed at [facility], [address] [place], [place] [address]  Urine Drug Screen     Status: Abnormal   Collection Time: 10/17/24  5:22 PM  Result Value Ref Range   Opiates NEGATIVE NEGATIVE   Cocaine NEGATIVE NEGATIVE   Benzodiazepines NEGATIVE NEGATIVE   Amphetamines NEGATIVE NEGATIVE   Tetrahydrocannabinol POSITIVE (A) NEGATIVE   Barbiturates NEGATIVE NEGATIVE   Methadone Scn, Ur NEGATIVE NEGATIVE   Fentanyl  NEGATIVE NEGATIVE    Comment: (NOTE) Drug screen is for Medical Purposes only. Positive results are preliminary only. If confirmation is needed, notify lab within 5 days.  Drug Class                 Cutoff (ng/mL) Amphetamine and metabolites 1000 Barbiturate and metabolites 200 Benzodiazepine              200 Opiates and metabolites     300 Cocaine and metabolites     300 THC                         50 Fentanyl                     5 Methadone                   300  Trazodone  is metabolized in vivo to several metabolites,  including pharmacologically active m-CPP, which is excreted in the  urine.  Immunoassay screens for amphetamines and MDMA have potential  cross-reactivity with these compounds and may provide false positive  result.  Performed at [facility], [address],  [place], [place] [address]   Magnesium     Status: None   Collection Time: 10/18/24  4:10 AM  Result Value Ref Range   Magnesium 1.9 1.7 - 2.4 mg/dL    Comment: Performed at [facility], [address], [place], [place] [address]  CBC with Differential/Platelet     Status: Abnormal   Collection Time: 10/18/24  4:10 AM  Result Value Ref Range   WBC 12.0 (H) 4.0 - 10.5 K/uL   RBC 3.65 (L) 3.87 - 5.11 MIL/uL   Hemoglobin 10.2 (L)  12.0 - 15.0 g/dL   HCT 67.8 (L) 63.9 - 53.9 %   MCV 87.9 80.0 - 100.0 fL   MCH 27.9 26.0 - 34.0 pg   MCHC 31.8 30.0 - 36.0 g/dL   RDW 86.6 88.4 - 84.4 %   Platelets 98 (L) 150 - 400 K/uL    Comment: REPEATED TO VERIFY PLATELET COUNT CONFIRMED BY SMEAR Immature Platelet Fraction may be clinically indicated, consider ordering this additional test [id number]    nRBC 0.0 0.0 - 0.2 %   Neutrophils Relative % 79 %   Neutro Abs 9.6 (H) 1.7 - 7.7 K/uL   Lymphocytes Relative 10 %   Lymphs Abs 1.1 0.7 - 4.0 K/uL   Monocytes Relative 10 %   Monocytes Absolute 1.2 (H) 0.1 - 1.0 K/uL   Eosinophils Relative 0 %   Eosinophils Absolute 0.0 0.0 - 0.5 K/uL   Basophils Relative 0 %   Basophils Absolute 0.0 0.0 - 0.1 K/uL   WBC Morphology MORPHOLOGY UNREMARKABLE    RBC Morphology MORPHOLOGY UNREMARKABLE    Immature Granulocytes 1 %   Abs Immature Granulocytes 0.09 (H) 0.00 - 0.07 K/uL    Comment: Performed at [facility], [address] [place], [place] [address]  Basic metabolic panel     Status: Abnormal   Collection Time: 10/18/24  4:10 AM  Result Value Ref Range   Sodium 138 135 - 145 mmol/L   Potassium 3.7 3.5 - 5.1 mmol/L   Chloride 106 98 - 111 mmol/L   CO2 25 22 - 32 mmol/L   Glucose, Bld 90 70 - 99 mg/dL    Comment: Glucose reference range applies only to samples taken after fasting for at least 8 hours.   BUN 6 6 - 20 mg/dL   Creatinine, Ser 9.34 0.44 - 1.00 mg/dL   Calcium  7.9 (L) 8.9 - 10.3 mg/dL   GFR, Estimated >39 >39 mL/min    Comment:  (NOTE) Calculated using the CKD-EPI Creatinine Equation (2021)    Anion gap 7 5 - 15    Comment: Performed at [facility], [address], [place], [place] [address]   Recent Results (from the past 240 hours)  Urine Culture     Status: None (Preliminary result)   Collection Time: 10/17/24  9:10 AM   Specimen: Urine, Clean Catch  Result Value Ref Range Status   Specimen Description   Final    URINE, CLEAN CATCH Performed at [facility], [address] [place], [place] [address]    Special Requests   Final    NONE Performed at [facility], [address] [place], [place] [address]    Culture   Final    CULTURE REINCUBATED FOR BETTER GROWTH Performed at [facility], [address] [place], [place] [address]    Report Status PENDING  Incomplete   Creatinine: Recent Labs    10/17/24 0926 10/18/24 0410  CREATININE 0.87 0.65   Baseline Creatinine: 0.65  Impression/Assessment:  28yo with right ureteral calculus, sepsis  Plan:  -We discussed the management of kidney stones. These options include observation, ureteroscopy, shockwave lithotripsy (ESWL) and percutaneous nephrolithotomy (PCNL). We discussed which options are relevant to the patient's stone(s). We discussed the natural history of kidney stones as well as the complications of untreated stones and the impact on quality of life without treatment as well as with each of the above listed treatments. We also discussed the efficacy of each treatment in its ability to clear the stone  burden. With any of these management options I discussed the signs and symptoms of infection and the need for emergent treatment should these be experienced. For each option we discussed the ability of each procedure to clear the patient of their stone burden.   For observation I described the risks which include but are not limited to silent renal damage, life-threatening infection, need for emergent surgery, failure to pass stone and pain.    For ureteroscopy I described the risks which include bleeding, infection, damage to contiguous structures, positioning injury, ureteral stricture, ureteral avulsion, ureteral injury, need for prolonged ureteral stent, inability to perform ureteroscopy, need for an interval procedure, inability to clear stone burden, stent discomfort/pain, heart attack, stroke, pulmonary embolus and the inherent risks with general anesthesia.   For shockwave lithotripsy I described the risks which include arrhythmia, kidney contusion, kidney hemorrhage, need for transfusion, pain, inability to adequately break up stone, inability to pass stone fragments, Steinstrasse, infection associated with obstructing stones, need for alternate surgical procedure, need for repeat shockwave lithotripsy, MI, CVA, PE and the inherent risks with anesthesia/conscious sedation.   For PCNL I described the risks including positioning injury, pneumothorax, hydrothorax, need for chest tube, inability to clear stone burden, renal laceration, arterial venous fistula or malformation, need for embolization of kidney, loss of kidney or renal function, need for repeat procedure, need for prolonged nephrostomy tube, ureteral avulsion, MI, CVA, PE and the inherent risks of general anesthesia.   - The patient would like to proceed with right ureteral stent placement  April Young 10/18/2024, 12:07 PM          [1]  Allergies Allergen Reactions   Penicillins Rash and Other (See Comments)    Has patient had a PCN reaction causing immediate rash, facial/tongue/throat swelling, SOB or lightheadedness with hypotension: No Has patient had a PCN reaction causing severe rash involving mucus membranes or skin necrosis: No Has patient had a PCN reaction that required hospitalization: No Has patient had a PCN reaction occurring within the last 10 years: No If all of the above answers are NO, then may proceed with Cephalosporin use.

## 2024-10-18 NOTE — Op Note (Signed)
.  Preoperative diagnosis: right UPJ stone, sepsis  Postoperative diagnosis: Same  Procedure: 1 cystoscopy 2. right retrograde pyelography 3.  Intraoperative fluoroscopy, under one hour, with interpretation 4. right 6 x 26 JJ stent placement  Attending: [name]  Anesthesia: General  Estimated blood loss: None  Drains: Right 6 x 26 JJ ureteral stent without tether  Specimens: none  Antibiotics: rocephin   Findings: right mid ureteral stone. Moderate hydronephrosis. No masses/lesions in the bladder. Ureteral orifices in normal anatomic location.  Indications: Patient is a 29 year old female with a history of right ureteral stone and concern for sepsis.  After discussing treatment options, they decided proceed with right stent placement.  Procedure in detail: The patient was brought to the operating room and a brief timeout was done to ensure correct patient, correct procedure, correct site.  General anesthesia was administered patient was placed in dorsal lithotomy position.  Their genitalia was then prepped and draped in usual sterile fashion.  A rigid 22 French cystoscope was passed in the urethra and the bladder.  Bladder was inspected free masses or lesions.  the ureteral orifices were in the normal orthotopic locations.  a 6 french ureteral catheter was then instilled into the right ureteral orifice.  a gentle retrograde was obtained and findings noted above.  we then placed a zip wire through the ureteral catheter and advanced up to the renal pelvis.    We then placed a 6 x 26 double-j ureteral stent over the original zip wire.  We then removed the wire and good coil was noted in the the renal pelvis under fluoroscopy and the bladder under direct vision.  the bladder was then drained and this concluded the procedure which was well tolerated by patient.  Complications: None  Condition: Stable, extubated, transferred to PACU  Plan: Patient is to be admitted for IV antibiotics.  She will have her stone extraction in 2 weeks.

## 2024-10-18 NOTE — Anesthesia Preprocedure Evaluation (Signed)
"                                    Anesthesia Evaluation  Patient identified by MRN, date of birth, ID band Patient awake    Reviewed: Allergy & Precautions, H&P , NPO status , Patient's Chart, lab work & pertinent test results, reviewed documented beta blocker date and time   Airway Mallampati: II  TM Distance: >3 FB Neck ROM: full    Dental no notable dental hx.    Pulmonary asthma , Current Smoker and Patient abstained from smoking.   Pulmonary exam normal breath sounds clear to auscultation       Cardiovascular Exercise Tolerance: Good hypertension, negative cardio ROS  Rhythm:regular Rate:Normal     Neuro/Psych negative neurological ROS  negative psych ROS   GI/Hepatic negative GI ROS, Neg liver ROS,,,  Endo/Other  negative endocrine ROS    Renal/GU Renal disease  negative genitourinary   Musculoskeletal   Abdominal   Peds  Hematology  (+) Blood dyscrasia, anemia   Anesthesia Other Findings   Reproductive/Obstetrics negative OB ROS                              Anesthesia Physical Anesthesia Plan  ASA: 3 and emergent  Anesthesia Plan: General ETT and General   Post-op Pain Management:    Induction:   PONV Risk Score and Plan: Ondansetron   Airway Management Planned:   Additional Equipment:   Intra-op Plan:   Post-operative Plan:   Informed Consent: I have reviewed the patients History and Physical, chart, labs and discussed the procedure including the risks, benefits and alternatives for the proposed anesthesia with the patient or authorized representative who has indicated his/her understanding and acceptance.     Dental Advisory Given  Plan Discussed with: CRNA  Anesthesia Plan Comments:         Anesthesia Quick Evaluation  "

## 2024-10-18 NOTE — Plan of Care (Signed)
  Problem: Education: Goal: Knowledge of General Education information will improve Description: Including pain rating scale, medication(s)/side effects and non-pharmacologic comfort measures Outcome: Progressing   Problem: Clinical Measurements: Goal: Ability to maintain clinical measurements within normal limits will improve Outcome: Progressing Goal: Will remain free from infection Outcome: Progressing Goal: Diagnostic test results will improve Outcome: Progressing Goal: Respiratory complications will improve Outcome: Progressing Goal: Cardiovascular complication will be avoided Outcome: Progressing   Problem: Nutrition: Goal: Adequate nutrition will be maintained Outcome: Progressing   Problem: Coping: Goal: Level of anxiety will decrease Outcome: Progressing   Problem: Pain Managment: Goal: General experience of comfort will improve and/or be controlled Outcome: Progressing   Problem: Safety: Goal: Ability to remain free from injury will improve Outcome: Progressing   Problem: Skin Integrity: Goal: Risk for impaired skin integrity will decrease Outcome: Progressing

## 2024-10-18 NOTE — Transfer of Care (Signed)
 Immediate Anesthesia Transfer of Care Note  Patient: HELYNE GENTHER  Procedure(s) Performed: CYSTOSCOPY, WITH RETROGRADE PYELOGRAM AND URETERAL STENT INSERTION (Right: Ureter)  Patient Location: PACU  Anesthesia Type:General  Level of Consciousness: awake, alert , oriented, and patient cooperative  Airway & Oxygen Therapy: Patient Spontanous Breathing  Post-op Assessment: Report given to RN, Post -op Vital signs reviewed and stable, and Patient moving all extremities X 4  Post vital signs: Reviewed and stable  Last Vitals:  Vitals Value Taken Time  BP 111/68 10/18/24 12:56  Temp 99.4 1258  Pulse 83 10/18/24 12:58  Resp 26 10/18/24 12:58  SpO2 100 % 10/18/24 12:58  Vitals shown include unfiled device data.  Last Pain:  Vitals:   10/18/24 1132  TempSrc: Oral  PainSc: 0-No pain      Patients Stated Pain Goal: 8 (10/18/24 1132)  Complications: No notable events documented.

## 2024-10-19 ENCOUNTER — Encounter (HOSPITAL_COMMUNITY): Payer: Self-pay | Admitting: Urology

## 2024-10-19 DIAGNOSIS — N201 Calculus of ureter: Secondary | ICD-10-CM | POA: Diagnosis not present

## 2024-10-19 DIAGNOSIS — A4159 Other Gram-negative sepsis: Secondary | ICD-10-CM

## 2024-10-19 DIAGNOSIS — N10 Acute tubulo-interstitial nephritis: Secondary | ICD-10-CM

## 2024-10-19 DIAGNOSIS — E876 Hypokalemia: Secondary | ICD-10-CM | POA: Diagnosis not present

## 2024-10-19 NOTE — Plan of Care (Signed)

## 2024-10-19 NOTE — Progress Notes (Signed)
 "          PROGRESS NOTE  April Young FMW:[id number] DOB: [birth year] DOA: 10/17/2024 PCP: Patient, No Pcp Per  Brief History:  29 year old female with no documented chronic medical problems recently seen in the [facility] ED on 10/15/2024 complaining of back pain and flank pain on the right side and urinary frequency and cloudiness was diagnosed with a UTI and treated with Macrobid .  She returned to the ED 10/17/24 complaining of ongoing nausea and vomiting and back pain/flank pain and overall malaise.  She denies fever and chills.  She was noted to be hypokalemic and blood glucose elevated at 143.  Her white blood cell count was elevated at 17.7.  Platelets were down to 107.  She is being admitted to the hospital for ongoing treatment of this UTI that has failed outpatient management.. CT abdomen and pelvis showed 5 mm right renal calculus in the right UVJ.  Urology was consulted.  The patient was started on ceftriaxone .   Assessment/Plan: Sepsis -present on admission -presented with fever, leukocytosis -due to pyelonephritis and right UVJ stone -sepsis physiology resolved  Pyelonephritis/right ureteral stone - Urology, Dr. Sherrilee consulted - 10/18/24--cystoscopy and R--JJ stent - Continue ceftriaxone  - IV fluid resuscitation given - UA >50 WBC - Follow urine culture--Proteus mirabilis - plan for stone extraction in 2 weeks   Thrombocytopenia - Secondary to acute medical condition/sepsis - B12 - Folic acid - No splenomegaly on CT [facility]   Hyperglycemia - Stress-induced - 10/17/2024 hemoglobin A1c 5.0   Hypokalemia - Repleted - Magnesium 1.9               Family Communication: no  Family at bedside   Consultants:  urology   Code Status:  FULL    DVT Prophylaxis:  Combined [place] Lovenox      Procedures: As Listed in Progress Note Above   Antibiotics: Ceftriaxone  1/6>>           Subjective: Pt states right flank and right abd pain are improving daily.  Denies f/c, n/v/d, cp,  sob  Objective: Vitals:   10/18/24 2024 10/19/24 0442 10/19/24 1132 10/19/24 1420  BP: 113/82 109/74 113/76 107/74  Pulse: 65 (!) 55 (!) 54 75  Resp: 18 19 16 20   Temp: 98 F (36.7 C) 98 F (36.7 C) 97.9 F (36.6 C) 97.9 F (36.6 C)  TempSrc: Oral Oral Oral Oral  SpO2: 98% 100% 100% 100%  Weight:      Height:        Intake/Output Summary (Last 24 hours) at 10/19/2024 1747 Last data filed at 10/19/2024 1652 Gross per 24 hour  Intake 422.32 ml  Output --  Net 422.32 ml   Weight change: 0 kg Exam:  General:  Pt is alert, follows commands appropriately, not in acute distress HEENT: No icterus, No thrush, No neck mass, [place]/AT Cardiovascular: RRR, S1/S2, no rubs, no gallops Respiratory: CTA bilaterally, no wheezing, no crackles, no rhonchi Abdomen: Soft/+BS, non tender, non distended, no guarding;  mild right CVA tenderness to percussion Extremities: No edema, No lymphangitis, No petechiae, No rashes, no synovitis   Data Reviewed: I have personally reviewed following labs and imaging studies Basic Metabolic Panel: Recent Labs  Lab 10/17/24 0926 10/18/24 0410  NA 137 138  K 3.2* 3.7  CL 100 106  CO2 25 25  GLUCOSE 143* 90  BUN 11 6  CREATININE 0.87 0.65  CALCIUM  8.9 7.9*  MG  --  1.9   Liver Function Tests: Recent Labs  Lab 10/17/24 0926  AST 19  ALT 7  ALKPHOS 103  BILITOT 0.5  PROT 7.7  ALBUMIN 4.4   No results for input(s): LIPASE, AMYLASE in the last 168 hours. No results for input(s): AMMONIA in the last 168 hours. Coagulation Profile: No results for input(s): INR, PROTIME in the last 168 hours. CBC: Recent Labs  Lab 10/17/24 0926 10/18/24 0410  WBC 17.7* 12.0*  NEUTROABS 15.8* 9.6*  HGB 12.3 10.2*  HCT 38.3 32.1*  MCV 88.5 87.9  PLT 107* 98*   Cardiac Enzymes: No results for input(s): CKTOTAL, CKMB, CKMBINDEX, TROPONINI in the last 168 hours. BNP: Invalid input(s): POCBNP CBG: No results for input(s): GLUCAP in the  last 168 hours. HbA1C: Recent Labs    10/17/24 0926  HGBA1C 5.0   Urine analysis:    Component Value Date/Time   COLORURINE AMBER (A) 10/15/2024 1016   APPEARANCEUR CLOUDY (A) 10/15/2024 1016   LABSPEC 1.018 10/15/2024 1016   PHURINE 7.0 10/15/2024 1016   GLUCOSEU NEGATIVE 10/15/2024 1016   HGBUR NEGATIVE 10/15/2024 1016   BILIRUBINUR NEGATIVE 10/15/2024 1016   KETONESUR NEGATIVE 10/15/2024 1016   PROTEINUR >=300 (A) 10/15/2024 1016   UROBILINOGEN 1.0 02/06/2015 0948   NITRITE POSITIVE (A) 10/15/2024 1016   LEUKOCYTESUR LARGE (A) 10/15/2024 1016   Sepsis Labs: @LABRCNTIP (procalcitonin:4,lacticidven:4) ) Recent Results (from the past 240 hours)  Urine Culture     Status: Abnormal (Preliminary result)   Collection Time: 10/17/24  9:10 AM   Specimen: Urine, Clean Catch  Result Value Ref Range Status   Specimen Description   Final    URINE, CLEAN CATCH Performed at [facility], [address] [place], [place] [address]    Special Requests   Final    NONE Performed at [facility], [address] [place], [place] [address]    Culture >=100,000 COLONIES/mL PROTEUS MIRABILIS (A)  Final   Report Status PENDING  Incomplete     Scheduled Meds:  acetaminophen   1,000 mg Oral Q6H   Or   acetaminophen   650 mg Rectal Q6H   enoxaparin  (LOVENOX ) injection  40 mg Subcutaneous Q24H   feeding supplement  237 mL Oral BID BM   Continuous Infusions:  cefTRIAXone  (ROCEPHIN )  IV Stopped (10/19/24 0932)    Procedures/Studies: DG C-Arm 1-60 Min-No Report Result Date: 10/18/2024 Fluoroscopy was utilized by the requesting physician.  No radiographic interpretation.   CT ABDOMEN PELVIS W CONTRAST Result Date: 10/17/2024 CLINICAL DATA:  Nausea and vomiting. EXAM: CT ABDOMEN AND PELVIS WITH CONTRAST TECHNIQUE: Multidetector CT imaging of the abdomen and pelvis was performed using the standard protocol following bolus administration of intravenous contrast. RADIATION DOSE REDUCTION: This  exam was performed according to the departmental dose-optimization program which includes automated exposure control, adjustment of the mA and/or kV according to patient size and/or use of iterative reconstruction technique. CONTRAST:  75mL OMNIPAQUE  IOHEXOL  300 MG/ML  SOLN COMPARISON:  June 02, 2008 FINDINGS: Lower chest: No acute abnormality. Hepatobiliary: No focal liver abnormality is seen. No gallstones, gallbladder wall thickening, or biliary dilatation. Pancreas: Unremarkable. No pancreatic ductal dilatation or surrounding inflammatory changes. Spleen: Normal in size without focal abnormality. Adrenals/Urinary Tract: Adrenal glands are unremarkable. Kidneys are normal in size, without focal lesions. A 7 mm nonobstructing renal calculus is seen within the right kidney, with a 5 mm obstructing renal calculus seen at the right UVJ. Mild to moderate severity right-sided hydronephrosis and hydroureter are present. Ill-defined patchy areas of parenchymal low attenuation are also noted within the  mid and upper right kidney. Bladder is unremarkable. Stomach/Bowel: Stomach is within normal limits. Appendix appears normal. No evidence of bowel wall thickening, distention, or inflammatory changes. Vascular/Lymphatic: No significant vascular findings are present. No enlarged abdominal or pelvic lymph nodes. Reproductive: Uterus and bilateral adnexa are unremarkable. Other: No abdominal wall hernia or abnormality. No abdominopelvic ascites. Musculoskeletal: No acute or significant osseous findings. IMPRESSION: 1. 5 mm obstructing renal calculus at the right UVJ. 2. 7 mm nonobstructing renal calculus within the right kidney. 3. Findings consistent with right-sided pyelonephritis. Correlation with urinalysis is recommended. Electronically Signed   By: Suzen Dials M.D.   On: 10/17/2024 16:06    Alm Schneider, DO  [facility]  If 7PM-7AM, please contact night-coverage [email or web address] Password [email or web address] 10/19/2024,  5:47 PM   LOS: 1 day   "

## 2024-10-19 NOTE — Plan of Care (Signed)
   Problem: Activity: Goal: Risk for activity intolerance will decrease Outcome: Progressing   Problem: Coping: Goal: Level of anxiety will decrease Outcome: Progressing

## 2024-10-19 NOTE — TOC CM/SW Note (Signed)
 Transition of Care (TOC) CM/SW Note   CSW updated by RN that pt has court today and will need a letter sent to court documenting hospital admission. CSW spoke with pt who confirms this, pt provided CSW with fax number for court letter to be sent to. CSW completed and sent for review by the court.

## 2024-10-19 NOTE — Progress Notes (Signed)
 Initial Nutrition Assessment  DOCUMENTATION CODES:  Underweight  INTERVENTION:  Magic cup TID with meals, each supplement provides 290 kcal and 9 grams of protein Ensure Plus High Protein po BID, each supplement provides 350 kcal and 20 grams of protein MVI with minerals daily Suggestions for Increasing Calories and Protein handout added to discharge instructions  NUTRITION DIAGNOSIS:  Underweight related to social / environmental circumstances as evidenced by other (comment) (BMI=16.7).  GOAL:  Patient will meet greater than or equal to 90% of their needs  MONITOR:  PO intake, Supplement acceptance  REASON FOR ASSESSMENT:  Rounds, Malnutrition Screening Tool    ASSESSMENT:  29 yo female admitted with acute cystitis. PMH includes asthma.  1/7: S/P R retrograde pyelography and stent placement  Patient reports good oral intake PTA. She usually eats 2-3 meals and multiple snacks. She likes a wide variety of foods. She says she has always been small and has not been able to gain weight. She was eating minimally for 3 days PTA d/t nausea, but that has improved. She does not really like Ensure supplements, but agreed to drink them. She also agreed to receive magic cups with meals to maximize intake of protein and calories.  Usual weight: 110 lbs Current weight: 110 lbs (appears to be carried over from previous admission 07/03/24).  Average Meal Intake: Not recorded  Nutritionally Relevant Medications: Scheduled Meds:  acetaminophen   1,000 mg Oral Q6H   Or   acetaminophen   650 mg Rectal Q6H   enoxaparin  (LOVENOX ) injection  40 mg Subcutaneous Q24H   feeding supplement  237 mL Oral BID BM   Continuous Infusions:  cefTRIAXone  (ROCEPHIN )  IV 2 g (10/19/24 0850)   PRN Meds:.bisacodyl , ketorolac , nicotine , ondansetron  **OR** ondansetron  (ZOFRAN ) IV, prochlorperazine , traZODone   Labs Reviewed: Calcium  7.9   NUTRITION - FOCUSED PHYSICAL EXAM: Patient declined  Diet Order:    Diet Order             Diet regular Fluid consistency: Thin  Diet effective now                   EDUCATION NEEDS:  Education needs have been addressed  Skin:  Skin Assessment: Reviewed RN Assessment  Last BM:  1/7  Height:  Ht Readings from Last 1 Encounters:  10/18/24 5' 8 (1.727 m)   Weight:  Wt Readings from Last 1 Encounters:  10/18/24 49.9 kg   Ideal Body Weight:  63.6 kg  BMI:  Body mass index is 16.73 kg/m.  Estimated Nutritional Needs:  Kcal:  1800-2000 Protein:  75-90 gm Fluid:  1.8-2 L   Suzen HUNT RD, LDN, CNSC Contact via secure chat. If unavailable, use group chat RD Inpatient.

## 2024-10-20 DIAGNOSIS — E876 Hypokalemia: Secondary | ICD-10-CM | POA: Diagnosis not present

## 2024-10-20 DIAGNOSIS — A4159 Other Gram-negative sepsis: Secondary | ICD-10-CM | POA: Diagnosis not present

## 2024-10-20 DIAGNOSIS — N10 Acute tubulo-interstitial nephritis: Secondary | ICD-10-CM | POA: Diagnosis not present

## 2024-10-20 DIAGNOSIS — N201 Calculus of ureter: Secondary | ICD-10-CM | POA: Diagnosis not present

## 2024-10-20 LAB — URINE CULTURE: Culture: >=100000 — AB

## 2024-10-20 LAB — CBC
HCT: 34.2 % — ABNORMAL LOW (ref 36.0–46.0)
Hemoglobin: 11.1 g/dL — ABNORMAL LOW (ref 12.0–15.0)
MCH: 28.3 pg (ref 26.0–34.0)
MCHC: 32.5 g/dL (ref 30.0–36.0)
MCV: 87.2 fL (ref 80.0–100.0)
Platelets: 139 K/uL — ABNORMAL LOW (ref 150–400)
RBC: 3.92 MIL/uL (ref 3.87–5.11)
RDW: 13.5 % (ref 11.5–15.5)
WBC: 9.3 K/uL (ref 4.0–10.5)
nRBC: 0 % (ref 0.0–0.2)

## 2024-10-20 LAB — BASIC METABOLIC PANEL WITH GFR
Anion gap: 8 (ref 5–15)
BUN: 11 mg/dL (ref 6–20)
CO2: 26 mmol/L (ref 22–32)
Calcium: 8.3 mg/dL — ABNORMAL LOW (ref 8.9–10.3)
Chloride: 107 mmol/L (ref 98–111)
Creatinine, Ser: 0.58 mg/dL (ref 0.44–1.00)
GFR, Estimated: >60 mL/min
Glucose, Bld: 76 mg/dL (ref 70–99)
Potassium: 3.6 mmol/L (ref 3.5–5.1)
Sodium: 141 mmol/L (ref 135–145)

## 2024-10-20 LAB — MAGNESIUM: Magnesium: 2.3 mg/dL (ref 1.7–2.4)

## 2024-10-20 MED ORDER — CEFADROXIL 500 MG PO CAPS: 1000.0000 mg | ORAL_CAPSULE | Freq: Two times a day (BID) | ORAL | 0 refills | Status: AC

## 2024-10-20 MED ORDER — ORAL CARE MOUTH RINSE: 15.0000 mL | OROMUCOSAL | Status: DC | PRN

## 2024-10-20 MED ORDER — CEFADROXIL 500 MG PO CAPS: 1000.0000 mg | ORAL_CAPSULE | Freq: Two times a day (BID) | ORAL | Status: DC

## 2024-10-20 NOTE — Discharge Summary (Addendum)
 " Physician Discharge Summary   Patient: April Young MRN: [id number] DOB: 1996/01/14  Admit date:     10/17/2024  Discharge date: 10/20/2024  Discharge Physician: Alm Bonna Steury   PCP: Patient, No Pcp Per   Recommendations at discharge:   Please follow up with primary care provider within 1-2 weeks  Please repeat BMP and CBC in one week     Hospital Course: 29 year old female with no documented chronic medical problems recently seen in the [facility] ED on 10/15/2024 complaining of back pain and flank pain on the right side and urinary frequency and cloudiness was diagnosed with a UTI and treated with Macrobid .  She returned to the ED 10/17/24 complaining of ongoing nausea and vomiting and back pain/flank pain and overall malaise.  She denies fever and chills.  She was noted to be hypokalemic and blood glucose elevated at 143.  Her white blood cell count was elevated at 17.7.  Platelets were down to 107.  She is being admitted to the hospital for ongoing treatment of this UTI that has failed outpatient management.. CT abdomen and pelvis showed 5 mm right renal calculus in the right UVJ.  Urology was consulted.  The patient was started on ceftriaxone .  The patient underwent right ureteral stent placement performed by Dr. [name].  She improved clinically with resolution of her sepsis criteria.  She was continued on ceftriaxone  pending culture data.  She was ultimately discharged home with cefadroxil  for 10 additional days to complete 2 weeks of therapy.  Assessment and Plan: Sepsis -present on admission -presented with fever, leukocytosis -due to pyelonephritis and right UVJ stone -sepsis physiology resolved   Pyelonephritis/right ureteral stone - Urology, Dr. Clara consulted - 10/18/24--cystoscopy and R--JJ stent - Continue ceftriaxone  - IV fluid resuscitation given - UA >50 WBC - Follow urine culture--Proteus mirabilis - d/c home with cefadroxil  x 10 more days - plan for stone  extraction in 2 weeks   Thrombocytopenia - Secondary to acute medical condition/sepsis -improving with abx - No splenomegaly on CT [facility]   Hyperglycemia - Stress-induced - 10/17/2024 hemoglobin A1c 5.0   Hypokalemia - Repleted - Magnesium 1.9   Underweight -started on supplements -BMI 16.73      Consultants: urology Procedures performed: right ureteral stent  Disposition: Home Diet recommendation:  Regular diet DISCHARGE MEDICATION: Allergies as of 10/20/2024       Reactions   Penicillins Rash, Other (See Comments)   Has patient had a PCN reaction causing immediate rash, facial/tongue/throat swelling, SOB or lightheadedness with hypotension: No Has patient had a PCN reaction causing severe rash involving mucus membranes or skin necrosis: No Has patient had a PCN reaction that required hospitalization: No Has patient had a PCN reaction occurring within the last 10 years: No If all of the above answers are NO, then may proceed with Cephalosporin use.        Medication List     STOP taking these medications    nitrofurantoin  (macrocrystal-monohydrate) 100 MG capsule Commonly known as: MACROBID        TAKE these medications    Blood Pressure Monitor Misc For regular home bp monitoring during pregnancy   cefadroxil  500 MG capsule Commonly known as: DURICEF Take 2 capsules (1,000 mg total) by mouth 2 (two) times daily.   medroxyPROGESTERone  150 MG/ML injection Commonly known as: DEPO-PROVERA  Inject 1 mL (150 mg total) into the muscle every 3 (three) months.   naproxen  500 MG tablet Commonly known as: Naprosyn  Take 1 tablet (500 mg  total) by mouth 2 (two) times daily with a meal.        Discharge Exam: Filed Weights   10/17/24 0826 10/18/24 1132  Weight: 49.9 kg 49.9 kg   HEENT:  [place]/AT, No thrush, no icterus CV:  RRR, no rub, no S3, no S4 Lung:  CTA, no wheeze, no rhonchi Abd:  soft/+BS, NT Ext:  No edema, no lymphangitis, no synovitis, no  rash   Condition at discharge: stable  The results of significant diagnostics from this hospitalization (including imaging, microbiology, ancillary and laboratory) are listed below for reference.   Imaging Studies: DG C-Arm 1-60 Min-No Report Result Date: 10/18/2024 Fluoroscopy was utilized by the requesting physician.  No radiographic interpretation.   CT ABDOMEN PELVIS W CONTRAST Result Date: 10/17/2024 CLINICAL DATA:  Nausea and vomiting. EXAM: CT ABDOMEN AND PELVIS WITH CONTRAST TECHNIQUE: Multidetector CT imaging of the abdomen and pelvis was performed using the standard protocol following bolus administration of intravenous contrast. RADIATION DOSE REDUCTION: This exam was performed according to the departmental dose-optimization program which includes automated exposure control, adjustment of the mA and/or kV according to patient size and/or use of iterative reconstruction technique. CONTRAST:  75mL OMNIPAQUE  IOHEXOL  300 MG/ML  SOLN COMPARISON:  June 02, 2008 FINDINGS: Lower chest: No acute abnormality. Hepatobiliary: No focal liver abnormality is seen. No gallstones, gallbladder wall thickening, or biliary dilatation. Pancreas: Unremarkable. No pancreatic ductal dilatation or surrounding inflammatory changes. Spleen: Normal in size without focal abnormality. Adrenals/Urinary Tract: Adrenal glands are unremarkable. Kidneys are normal in size, without focal lesions. A 7 mm nonobstructing renal calculus is seen within the right kidney, with a 5 mm obstructing renal calculus seen at the right UVJ. Mild to moderate severity right-sided hydronephrosis and hydroureter are present. Ill-defined patchy areas of parenchymal low attenuation are also noted within the mid and upper right kidney. Bladder is unremarkable. Stomach/Bowel: Stomach is within normal limits. Appendix appears normal. No evidence of bowel wall thickening, distention, or inflammatory changes. Vascular/Lymphatic: No significant vascular  findings are present. No enlarged abdominal or pelvic lymph nodes. Reproductive: Uterus and bilateral adnexa are unremarkable. Other: No abdominal wall hernia or abnormality. No abdominopelvic ascites. Musculoskeletal: No acute or significant osseous findings. IMPRESSION: 1. 5 mm obstructing renal calculus at the right UVJ. 2. 7 mm nonobstructing renal calculus within the right kidney. 3. Findings consistent with right-sided pyelonephritis. Correlation with urinalysis is recommended. Electronically Signed   By: Suzen Dials M.D.   On: 10/17/2024 16:06    Microbiology: Results for orders placed or performed during the hospital encounter of 10/17/24  Urine Culture     Status: Abnormal   Collection Time: 10/17/24  9:10 AM   Specimen: Urine, Clean Catch  Result Value Ref Range Status   Specimen Description   Final    URINE, CLEAN CATCH Performed at [facility], [address], [place], [place] [address]    Special Requests   Final    NONE Performed at [facility], [address] [place], [place] [address]    Culture >=100,000 COLONIES/mL PROTEUS MIRABILIS (A)  Final   Report Status 10/20/2024 FINAL  Final   Organism ID, Bacteria PROTEUS MIRABILIS (A)  Final      Susceptibility   Proteus mirabilis - MIC*    AMPICILLIN <=2 SENSITIVE Sensitive     CEFAZOLIN (URINE) Value in next row Sensitive      4 SENSITIVEThis is a modified FDA-approved test that has been validated and its performance characteristics determined by the reporting laboratory.  This laboratory is certified under the Clinical Laboratory Improvement Amendments CLIA as qualified to perform high complexity clinical laboratory testing.    CEFEPIME Value in next row Sensitive      4 SENSITIVEThis is a modified FDA-approved test that has been validated and its performance characteristics determined by the reporting laboratory.  This laboratory is certified under the Clinical Laboratory Improvement Amendments CLIA as qualified to  perform high complexity clinical laboratory testing.    ERTAPENEM Value in next row Sensitive      4 SENSITIVEThis is a modified FDA-approved test that has been validated and its performance characteristics determined by the reporting laboratory.  This laboratory is certified under the Clinical Laboratory Improvement Amendments CLIA as qualified to perform high complexity clinical laboratory testing.    CEFTRIAXONE  Value in next row Sensitive      4 SENSITIVEThis is a modified FDA-approved test that has been validated and its performance characteristics determined by the reporting laboratory.  This laboratory is certified under the Clinical Laboratory Improvement Amendments CLIA as qualified to perform high complexity clinical laboratory testing.    CIPROFLOXACIN  Value in next row Sensitive      4 SENSITIVEThis is a modified FDA-approved test that has been validated and its performance characteristics determined by the reporting laboratory.  This laboratory is certified under the Clinical Laboratory Improvement Amendments CLIA as qualified to perform high complexity clinical laboratory testing.    GENTAMICIN Value in next row Sensitive      4 SENSITIVEThis is a modified FDA-approved test that has been validated and its performance characteristics determined by the reporting laboratory.  This laboratory is certified under the Clinical Laboratory Improvement Amendments CLIA as qualified to perform high complexity clinical laboratory testing.    NITROFURANTOIN  Value in next row Resistant      4 SENSITIVEThis is a modified FDA-approved test that has been validated and its performance characteristics determined by the reporting laboratory.  This laboratory is certified under the Clinical Laboratory Improvement Amendments CLIA as qualified to perform high complexity clinical laboratory testing.    TRIMETH /SULFA  Value in next row Sensitive      4 SENSITIVEThis is a modified FDA-approved test that has been  validated and its performance characteristics determined by the reporting laboratory.  This laboratory is certified under the Clinical Laboratory Improvement Amendments CLIA as qualified to perform high complexity clinical laboratory testing.    AMPICILLIN/SULBACTAM Value in next row Sensitive      4 SENSITIVEThis is a modified FDA-approved test that has been validated and its performance characteristics determined by the reporting laboratory.  This laboratory is certified under the Clinical Laboratory Improvement Amendments CLIA as qualified to perform high complexity clinical laboratory testing.    PIP/TAZO Value in next row Sensitive      <=4 SENSITIVEThis is a modified FDA-approved test that has been validated and its performance characteristics determined by the reporting laboratory.  This laboratory is certified under the Clinical Laboratory Improvement Amendments CLIA as qualified to perform high complexity clinical laboratory testing.    MEROPENEM Value in next row Sensitive      <=4 SENSITIVEThis is a modified FDA-approved test that has been validated and its performance characteristics determined by the reporting laboratory.  This laboratory is certified under the Clinical Laboratory Improvement Amendments CLIA as qualified to perform high complexity clinical laboratory testing.    * >=100,000 COLONIES/mL PROTEUS MIRABILIS    Labs: CBC: Recent Labs  Lab 10/17/24 0926 10/18/24 0410 10/20/24 0405  WBC 17.7* 12.0* 9.3  NEUTROABS 15.8* 9.6*  --   HGB 12.3 10.2* 11.1*  HCT 38.3 32.1* 34.2*  MCV 88.5 87.9 87.2  PLT 107* 98* 139*   Basic Metabolic Panel: Recent Labs  Lab 10/17/24 0926 10/18/24 0410 10/20/24 0405  NA 137 138 141  K 3.2* 3.7 3.6  CL 100 106 107  CO2 25 25 26   GLUCOSE 143* 90 76  BUN 11 6 11   CREATININE 0.87 0.65 0.58  CALCIUM  8.9 7.9* 8.3*  MG  --  1.9 2.3   Liver Function Tests: Recent Labs  Lab 10/17/24 0926  AST 19  ALT 7  ALKPHOS 103  BILITOT 0.5   PROT 7.7  ALBUMIN 4.4   CBG: No results for input(s): GLUCAP in the last 168 hours.  Discharge time spent: greater than 30 minutes.  Signed: Alm Schneider, MD [facility] 10/20/2024 "

## 2024-10-20 NOTE — Anesthesia Postprocedure Evaluation (Signed)
"   Anesthesia Post Note  Patient: April Young  Procedure(s) Performed: CYSTOSCOPY, WITH RETROGRADE PYELOGRAM AND URETERAL STENT INSERTION (Right: Ureter)  Patient location during evaluation: Phase II Anesthesia Type: General Level of consciousness: awake Pain management: pain level controlled Vital Signs Assessment: post-procedure vital signs reviewed and stable Respiratory status: spontaneous breathing and respiratory function stable Cardiovascular status: blood pressure returned to baseline and stable Postop Assessment: no headache and no apparent nausea or vomiting Anesthetic complications: no Comments: Late entry   No notable events documented.   Last Vitals:  Vitals:   10/19/24 2047 10/20/24 0513  BP: 114/74 106/71  Pulse: 62 (!) 51  Resp: 19 19  Temp: 36.6 C 36.7 C  SpO2: 100% 99%    Last Pain:  Vitals:   10/20/24 0513  TempSrc: Oral  PainSc:                  [name] PARAS Clete Kuch      "

## 2024-10-20 NOTE — Plan of Care (Signed)
" °  Problem: Education: Goal: Knowledge of General Education information will improve Description: Including pain rating scale, medication(s)/side effects and non-pharmacologic comfort measures Outcome: Adequate for Discharge   Problem: Health Behavior/Discharge Planning: Goal: Ability to manage health-related needs will improve Outcome: Adequate for Discharge   Problem: Clinical Measurements: Goal: Ability to maintain clinical measurements within normal limits will improve Outcome: Adequate for Discharge Goal: Will remain free from infection Outcome: Adequate for Discharge Goal: Diagnostic test results will improve Outcome: Adequate for Discharge Goal: Respiratory complications will improve Outcome: Adequate for Discharge Goal: Cardiovascular complication will be avoided Outcome: Adequate for Discharge   Problem: Activity: Goal: Risk for activity intolerance will decrease Outcome: Adequate for Discharge   Problem: Nutrition: Goal: Adequate nutrition will be maintained Outcome: Adequate for Discharge   Problem: Coping: Goal: Level of anxiety will decrease Outcome: Adequate for Discharge   Problem: Elimination: Goal: Will not experience complications related to bowel motility Outcome: Adequate for Discharge Goal: Will not experience complications related to urinary retention Outcome: Adequate for Discharge   Problem: Pain Managment: Goal: General experience of comfort will improve and/or be controlled Outcome: Adequate for Discharge   Problem: Safety: Goal: Ability to remain free from injury will improve Outcome: Adequate for Discharge   Problem: Skin Integrity: Goal: Risk for impaired skin integrity will decrease Outcome: Adequate for Discharge   Problem: Education: Goal: Knowledge of the prescribed therapeutic regimen will improve Outcome: Adequate for Discharge   Problem: Bowel/Gastric: Goal: Gastrointestinal status for postoperative course will  improve Outcome: Adequate for Discharge   Problem: Cardiac: Goal: Ability to maintain an adequate cardiac output Outcome: Adequate for Discharge Goal: Will show no evidence of cardiac arrhythmias Outcome: Adequate for Discharge   Problem: Nutritional: Goal: Will attain and maintain optimal nutritional status Outcome: Adequate for Discharge   Problem: Neurological: Goal: Will regain or maintain usual level of consciousness Outcome: Adequate for Discharge   Problem: Clinical Measurements: Goal: Ability to maintain clinical measurements within normal limits Outcome: Adequate for Discharge Goal: Postoperative complications will be avoided or minimized Outcome: Adequate for Discharge   Problem: Respiratory: Goal: Will regain and/or maintain adequate ventilation Outcome: Adequate for Discharge Goal: Respiratory status will improve Outcome: Adequate for Discharge   Problem: Skin Integrity: Goal: Demonstrates signs of wound healing without infection Outcome: Adequate for Discharge   Problem: Urinary Elimination: Goal: Will remain free from infection Outcome: Adequate for Discharge Goal: Ability to achieve and maintain adequate urine output Outcome: Adequate for Discharge   "

## 2024-10-30 ENCOUNTER — Telehealth: Payer: Self-pay | Admitting: Urology

## 2024-10-30 NOTE — Telephone Encounter (Signed)
 Patient had stent put in at hospital and was told she would have surgery with Dr Sherrilee 2 weeks after that. This was done 10/18/2024. Please call [phone]

## 2024-10-31 NOTE — Telephone Encounter (Signed)
Please place surgery orders.  

## 2024-11-01 ENCOUNTER — Other Ambulatory Visit: Payer: Self-pay | Admitting: Urology

## 2024-11-01 DIAGNOSIS — N200 Calculus of kidney: Secondary | ICD-10-CM

## 2024-11-01 NOTE — Telephone Encounter (Signed)
 Pt called about surgery. Pt is made aware once surgery scheduler gets OR scheduled, she will call pt. Voiced understanding.

## 2024-11-07 ENCOUNTER — Encounter (HOSPITAL_COMMUNITY): Payer: Self-pay

## 2024-11-07 ENCOUNTER — Encounter (HOSPITAL_COMMUNITY)
Admission: RE | Admit: 2024-11-07 | Discharge: 2024-11-07 | Disposition: A | Discharge status: Home / Self Care | Source: Ambulatory Visit | Attending: Urology | Admitting: Urology

## 2024-11-07 VITALS — Ht 68.0 in | Wt 110.0 lb

## 2024-11-07 DIAGNOSIS — Z01818 Encounter for other preprocedural examination: Secondary | ICD-10-CM

## 2024-11-09 ENCOUNTER — Ambulatory Visit (HOSPITAL_COMMUNITY): Admitting: Anesthesiology

## 2024-11-09 ENCOUNTER — Ambulatory Visit (HOSPITAL_COMMUNITY)

## 2024-11-09 ENCOUNTER — Ambulatory Visit (HOSPITAL_COMMUNITY)
Admission: RE | Admit: 2024-11-09 | Discharge: 2024-11-09 | Disposition: A | Discharge status: Home / Self Care | Attending: Urology | Admitting: Urology

## 2024-11-09 ENCOUNTER — Encounter (HOSPITAL_COMMUNITY)
Admission: RE | Disposition: A | Discharge status: Home / Self Care | Payer: Self-pay | Source: Home / Self Care | Attending: Urology

## 2024-11-09 ENCOUNTER — Encounter (HOSPITAL_COMMUNITY): Payer: Self-pay | Admitting: Urology

## 2024-11-09 DIAGNOSIS — Z01818 Encounter for other preprocedural examination: Secondary | ICD-10-CM

## 2024-11-09 DIAGNOSIS — N200 Calculus of kidney: Secondary | ICD-10-CM | POA: Diagnosis not present

## 2024-11-09 DIAGNOSIS — N202 Calculus of kidney with calculus of ureter: Secondary | ICD-10-CM | POA: Diagnosis not present

## 2024-11-09 DIAGNOSIS — N201 Calculus of ureter: Secondary | ICD-10-CM | POA: Diagnosis present

## 2024-11-09 LAB — POCT PREGNANCY, URINE: Preg Test, Ur: NEGATIVE

## 2024-11-09 MED ORDER — ONDANSETRON HCL 4 MG/2ML IJ SOLN
INTRAMUSCULAR | Status: DC | PRN
  Administered 2024-11-09: 4 mg via INTRAVENOUS

## 2024-11-09 MED ORDER — TAMSULOSIN HCL 0.4 MG PO CAPS: 0.4000 mg | ORAL_CAPSULE | Freq: Every day | ORAL | 0 refills | Status: AC

## 2024-11-09 MED ORDER — DEXAMETHASONE SOD PHOSPHATE PF 10 MG/ML IJ SOLN
INTRAMUSCULAR | Status: AC
  Filled 2024-11-09: qty 1

## 2024-11-09 MED ORDER — ONDANSETRON HCL 4 MG/2ML IJ SOLN
INTRAMUSCULAR | Status: AC
  Filled 2024-11-09: qty 2

## 2024-11-09 MED ORDER — WATER FOR IRRIGATION, STERILE IR SOLN
Status: DC | PRN
  Administered 2024-11-09: 500 mL

## 2024-11-09 MED ORDER — DIATRIZOATE MEGLUMINE 30 % UR SOLN
URETHRAL | Status: DC | PRN
  Administered 2024-11-09: 7 mL via URETHRAL

## 2024-11-09 MED ORDER — LACTATED RINGERS IV SOLN: INTRAVENOUS | Status: DC

## 2024-11-09 MED ORDER — SODIUM CHLORIDE 0.9 % IR SOLN
Status: DC | PRN
  Administered 2024-11-09: 3000 mL

## 2024-11-09 MED ORDER — OXYCODONE HCL 5 MG/5ML PO SOLN: 5.0000 mg | Freq: Once | ORAL | Status: DC | PRN

## 2024-11-09 MED ORDER — FENTANYL CITRATE (PF) 50 MCG/ML IJ SOSY: 25.0000 ug | PREFILLED_SYRINGE | INTRAMUSCULAR | Status: DC | PRN

## 2024-11-09 MED ORDER — DEXAMETHASONE SOD PHOSPHATE PF 10 MG/ML IJ SOLN
INTRAMUSCULAR | Status: DC | PRN
  Administered 2024-11-09: 5 mg via INTRAVENOUS

## 2024-11-09 MED ORDER — FENTANYL CITRATE (PF) 100 MCG/2ML IJ SOLN
INTRAMUSCULAR | Status: AC
  Filled 2024-11-09: qty 2

## 2024-11-09 MED ORDER — HYDROCODONE-ACETAMINOPHEN 5-325 MG PO TABS: 1.0000 | ORAL_TABLET | Freq: Four times a day (QID) | ORAL | 0 refills | Status: AC | PRN

## 2024-11-09 MED ORDER — MIDAZOLAM HCL (PF) 2 MG/2ML IJ SOLN
INTRAMUSCULAR | Status: DC | PRN
  Administered 2024-11-09: 2 mg via INTRAVENOUS

## 2024-11-09 MED ORDER — DEXMEDETOMIDINE HCL IN NACL 80 MCG/20ML IV SOLN
INTRAVENOUS | Status: DC | PRN
  Administered 2024-11-09: 16 ug via INTRAVENOUS

## 2024-11-09 MED ORDER — EPHEDRINE SULFATE (PRESSORS) 25 MG/5ML IV SOSY
PREFILLED_SYRINGE | INTRAVENOUS | Status: DC | PRN
  Administered 2024-11-09 (×2): 5 mg via INTRAVENOUS

## 2024-11-09 MED ORDER — EPHEDRINE 5 MG/ML INJ
INTRAVENOUS | Status: AC
  Filled 2024-11-09: qty 5

## 2024-11-09 MED ORDER — DIATRIZOATE MEGLUMINE 30 % UR SOLN
URETHRAL | Status: AC
  Filled 2024-11-09: qty 100

## 2024-11-09 MED ORDER — CEFAZOLIN SODIUM-DEXTROSE 2-4 GM/100ML-% IV SOLN
2.0000 g | INTRAVENOUS | Status: AC
  Administered 2024-11-09: 2 g via INTRAVENOUS
  Filled 2024-11-09: qty 100

## 2024-11-09 MED ORDER — CHLORHEXIDINE GLUCONATE 0.12 % MT SOLN
15.0000 mL | Freq: Once | OROMUCOSAL | Status: AC
  Administered 2024-11-09: 15 mL via OROMUCOSAL

## 2024-11-09 MED ORDER — OXYCODONE HCL 5 MG PO TABS: 5.0000 mg | ORAL_TABLET | Freq: Once | ORAL | Status: DC | PRN

## 2024-11-09 MED ORDER — ONDANSETRON HCL 4 MG/2ML IJ SOLN: 4.0000 mg | Freq: Once | INTRAMUSCULAR | Status: DC | PRN

## 2024-11-09 MED ORDER — ONDANSETRON HCL 4 MG PO TABS: 4.0000 mg | ORAL_TABLET | Freq: Three times a day (TID) | ORAL | 0 refills | Status: AC | PRN

## 2024-11-09 MED ORDER — PROPOFOL 10 MG/ML IV BOLUS
INTRAVENOUS | Status: AC
  Filled 2024-11-09: qty 20

## 2024-11-09 MED ORDER — FENTANYL CITRATE (PF) 100 MCG/2ML IJ SOLN
INTRAMUSCULAR | Status: DC | PRN
  Administered 2024-11-09: 100 ug via INTRAVENOUS

## 2024-11-09 MED ORDER — MIDAZOLAM HCL 2 MG/2ML IJ SOLN
INTRAMUSCULAR | Status: AC
  Filled 2024-11-09: qty 2

## 2024-11-09 NOTE — Transfer of Care (Signed)
 Immediate Anesthesia Transfer of Care Note  Patient: April Young  Procedure(s) Performed: CYSTOSCOPY, WITH RETROGRADE PYELOGRAM, URETEROSCOPY, URINARY CALCULUS LASER LITHOTRIPSY, AND STENT INSERT (Right: Ureter) HOLMIUM LASER APPLICATION (Right: Ureter) CYSTOSCOPY, WITH CALCULUS REMOVAL USING BASKET (Right: Ureter)  Patient Location: PACU  Anesthesia Type:General  Level of Consciousness: awake, alert , oriented, and patient cooperative  Airway & Oxygen Therapy: Patient Spontanous Breathing and Patient connected to face mask oxygen  Post-op Assessment: Report given to RN, Post -op Vital signs reviewed and stable, and Patient moving all extremities X 4  Post vital signs: Reviewed and stable  Last Vitals:  Vitals Value Taken Time  BP 101/58 11/09/24 08:51  Temp 36.6 C 11/09/24 08:51  Pulse    Resp 18 11/09/24 08:54  SpO2 100 % 11/09/24 08:51  Vitals shown include unfiled device data.  Last Pain:  Vitals:   11/09/24 0651  PainSc: 0-No pain         Complications: No notable events documented.

## 2024-11-09 NOTE — Discharge Instructions (Addendum)
 Follow up in one week.  Pull stent in 72 hours by pulling the tether

## 2024-11-09 NOTE — Op Note (Signed)
 Preoperative diagnosis: Right ureteral and renal stone  Postoperative diagnosis: right renal stone  Procedure: 1 cystoscopy 2. Right retrograde pyelography 3.  Intraoperative fluoroscopy, under one hour, with interpretation 4.  Right ureteroscopic stone manipulation with laser lithotripsy 5.  right 6 x 26 JJ stent placement  Attending: Belvie Standing  Anesthesia: General  Estimated blood loss: None  Drains: Right 6 x 26 JJ ureteral stent with tether  Specimens: stone for analysis  Antibiotics: ancef   Findings: Right lower pole stone. No hydronephrosis. No masses/lesions in the bladder. Ureteral orifices in normal anatomic location.  Indications: Patient is a 29 year old female with a history of right ureteral and renal stones who had a stent placed 3 weeks ago for sepsis.  After discussing treatment options, she decided proceed with right ureteroscopic stone manipulation.  Procedure in detail: The patient was brought to the operating room and a brief timeout was done to ensure correct patient, correct procedure, correct site.  General anesthesia was administered patient was placed in dorsal lithotomy position.  Her genitalia was then prepped and draped in usual sterile fashion.  A rigid 22 French cystoscope was passed in the urethra and the bladder.  Bladder was inspected free masses or lesions.  the ureteral orifices were in the normal orthotopic locations.  a 6 french ureteral catheter was then instilled into the right ureteral orifice.  a gentle retrograde was obtained and findings noted above. Using a grasper the right ureteral stent was brought to the urethral meatus.  we then placed a zip wire through the ureteral stent and advanced up to the renal pelvis. We removed the stent  we then removed the cystoscope and cannulated the right ureteral orifice with a semirigid ureteroscope.  No stone was found in the ureter. Once we reached the UPJ a sensor wire was advanced in to the renal  pelvis. We then removed the ureteroscope and advanced am 12/14 x 35cm access sheath up to the renal pelvis. We then used the flexible ureteroscope to perform nephroscopy. We encountered the stone in the lower pole. Using a 242nm laser fiber the stone was fragmented and the fragments were removed with a Ngage basket.    once all stone fragments were removed we then removed the access sheath under direct vision and noted no injury to the ureter. We then placed a 6 x 26 double-j ureteral stent over the original zip wire.  We then removed the wire and good coil was noted in the the renal pelvis under fluoroscopy and the bladder under direct vision. the bladder was then drained and this concluded the procedure which was well tolerated by patient.  Complications: None  Condition: Stable, extubated, transferred to PACU  Plan: Patient is to be discharged home as to follow-up in one week. She is to remove her stent in 72 hours by pulling the tether

## 2024-11-09 NOTE — Anesthesia Procedure Notes (Signed)
 Procedure Name: LMA Insertion Date/Time: 11/09/2024 7:44 AM  Performed by: Cordella Elvie HERO, CRNAPre-anesthesia Checklist: Patient identified, Emergency Drugs available, Suction available, Patient being monitored and Timeout performed Patient Re-evaluated:Patient Re-evaluated prior to induction Oxygen Delivery Method: Circle system utilized Preoxygenation: Pre-oxygenation with 100% oxygen Induction Type: IV induction LMA: LMA inserted LMA Size: 3.0 Number of attempts: 1 Placement Confirmation: positive ETCO2, CO2 detector and breath sounds checked- equal and bilateral Tube secured with: Tape Dental Injury: Teeth and Oropharynx as per pre-operative assessment

## 2024-11-09 NOTE — Anesthesia Preprocedure Evaluation (Signed)
"                                    Anesthesia Evaluation  Patient identified by MRN, date of birth, ID band Patient awake    Reviewed: Allergy & Precautions, H&P , NPO status , Patient's Chart, lab work & pertinent test results, reviewed documented beta blocker date and time   Airway Mallampati: II  TM Distance: >3 FB Neck ROM: full    Dental no notable dental hx.    Pulmonary asthma , Current Smoker and Patient abstained from smoking.   Pulmonary exam normal breath sounds clear to auscultation       Cardiovascular Exercise Tolerance: Good hypertension, negative cardio ROS  Rhythm:regular Rate:Normal     Neuro/Psych negative neurological ROS  negative psych ROS   GI/Hepatic negative GI ROS, Neg liver ROS,,,  Endo/Other  negative endocrine ROS    Renal/GU Renal disease  negative genitourinary   Musculoskeletal   Abdominal   Peds  Hematology  (+) Blood dyscrasia, anemia   Anesthesia Other Findings   Reproductive/Obstetrics negative OB ROS                              Anesthesia Physical Anesthesia Plan  ASA: 3 and emergent  Anesthesia Plan: General ETT and General   Post-op Pain Management:    Induction:   PONV Risk Score and Plan: Ondansetron   Airway Management Planned:   Additional Equipment:   Intra-op Plan:   Post-operative Plan:   Informed Consent: I have reviewed the patients History and Physical, chart, labs and discussed the procedure including the risks, benefits and alternatives for the proposed anesthesia with the patient or authorized representative who has indicated his/her understanding and acceptance.     Dental Advisory Given  Plan Discussed with: CRNA  Anesthesia Plan Comments:         Anesthesia Quick Evaluation  "

## 2024-11-09 NOTE — Interval H&P Note (Signed)
 History and Physical Interval Note:  11/09/2024 7:31 AM  April Young  has presented today for surgery, with the diagnosis of Right Ureteral Stone.  The various methods of treatment have been discussed with the patient and family. After consideration of risks, benefits and other options for treatment, the patient has consented to  Procedures: CYSTOSCOPY, WITH RETROGRADE PYELOGRAM, URETEROSCOPY, URINARY CALCULUS LASER LITHOTRIPSY, AND STENT INSERT (Right) HOLMIUM LASER APPLICATION (Right) as a surgical intervention.  The patient's history has been reviewed, patient examined, no change in status, stable for surgery.  I have reviewed the patient's chart and labs.  Questions were answered to the patient's satisfaction.     [name]

## 2024-11-10 ENCOUNTER — Encounter (HOSPITAL_COMMUNITY): Payer: Self-pay | Admitting: Urology

## 2024-11-10 NOTE — Anesthesia Postprocedure Evaluation (Signed)
"   Anesthesia Post Note  Patient: April Young  Procedure(s) Performed: CYSTOSCOPY, WITH RETROGRADE PYELOGRAM, URETEROSCOPY, URINARY CALCULUS LASER LITHOTRIPSY, AND STENT INSERT (Right: Ureter) HOLMIUM LASER APPLICATION (Right: Ureter) CYSTOSCOPY, WITH CALCULUS REMOVAL USING BASKET (Right: Ureter)  Patient location during evaluation: Phase II Anesthesia Type: General Level of consciousness: awake Pain management: pain level controlled Vital Signs Assessment: post-procedure vital signs reviewed and stable Respiratory status: spontaneous breathing and respiratory function stable Cardiovascular status: blood pressure returned to baseline and stable Postop Assessment: no headache and no apparent nausea or vomiting Anesthetic complications: no Comments: Late entry   No notable events documented.   Last Vitals:  Vitals:   11/09/24 0915 11/09/24 0918  BP: 105/65 106/63  Resp: 11 (!) 21  Temp:  36.7 C  SpO2: 100% 100%    Last Pain:  Vitals:   11/09/24 0935  PainSc: 0-No pain                 [name] PARAS Petra Sargeant      "

## 2024-11-22 ENCOUNTER — Ambulatory Visit: Admitting: Urology
